# Patient Record
Sex: Female | Born: 1935 | Race: Black or African American | Hispanic: No | Marital: Married | Smoking: Former smoker
Health system: Southern US, Community
[De-identification: ages and names within clinical notes are randomized; demographics above are authoritative.]

## PROBLEM LIST (undated history)

## (undated) DIAGNOSIS — N390 Urinary tract infection, site not specified: Secondary | ICD-10-CM

## (undated) DIAGNOSIS — F0390 Unspecified dementia without behavioral disturbance: Secondary | ICD-10-CM

## (undated) DIAGNOSIS — R4189 Other symptoms and signs involving cognitive functions and awareness: Secondary | ICD-10-CM

## (undated) DIAGNOSIS — E079 Disorder of thyroid, unspecified: Secondary | ICD-10-CM

## (undated) DIAGNOSIS — M1990 Unspecified osteoarthritis, unspecified site: Secondary | ICD-10-CM

## (undated) DIAGNOSIS — E7800 Pure hypercholesterolemia, unspecified: Secondary | ICD-10-CM

## (undated) DIAGNOSIS — I10 Essential (primary) hypertension: Secondary | ICD-10-CM

## (undated) HISTORY — DX: Pure hypercholesterolemia, unspecified: E78.00

## (undated) HISTORY — DX: Unspecified dementia, unspecified severity, without behavioral disturbance, psychotic disturbance, mood disturbance, and anxiety: F03.90

## (undated) HISTORY — PX: COLON SURGERY: SHX602

## (undated) HISTORY — DX: Other symptoms and signs involving cognitive functions and awareness: R41.89

## (undated) HISTORY — PX: GASTROSTOMY W/ FEEDING TUBE: SUR642

---

## 2000-03-28 ENCOUNTER — Ambulatory Visit (HOSPITAL_COMMUNITY): Admission: RE | Admit: 2000-03-28 | Discharge: 2000-03-28 | Payer: Self-pay | Admitting: Family Medicine

## 2000-05-09 ENCOUNTER — Ambulatory Visit (HOSPITAL_COMMUNITY): Admission: RE | Admit: 2000-05-09 | Discharge: 2000-05-09 | Payer: Self-pay

## 2001-05-22 ENCOUNTER — Ambulatory Visit (HOSPITAL_COMMUNITY): Admission: RE | Admit: 2001-05-22 | Discharge: 2001-05-22 | Payer: Self-pay | Admitting: Gastroenterology

## 2001-06-09 ENCOUNTER — Inpatient Hospital Stay (HOSPITAL_COMMUNITY): Admission: RE | Admit: 2001-06-09 | Discharge: 2001-06-15 | Payer: Self-pay | Admitting: Surgery

## 2001-06-09 ENCOUNTER — Encounter: Payer: Self-pay | Admitting: Surgery

## 2001-12-30 ENCOUNTER — Other Ambulatory Visit: Admission: RE | Admit: 2001-12-30 | Discharge: 2001-12-30 | Payer: Self-pay | Admitting: *Deleted

## 2002-01-06 ENCOUNTER — Other Ambulatory Visit: Admission: RE | Admit: 2002-01-06 | Discharge: 2002-01-06 | Payer: Self-pay | Admitting: Radiology

## 2002-03-04 ENCOUNTER — Ambulatory Visit (HOSPITAL_COMMUNITY): Admission: RE | Admit: 2002-03-04 | Discharge: 2002-03-04 | Payer: Self-pay | Admitting: Gastroenterology

## 2003-01-13 ENCOUNTER — Other Ambulatory Visit: Admission: RE | Admit: 2003-01-13 | Discharge: 2003-01-13 | Payer: Self-pay | Admitting: *Deleted

## 2005-08-27 ENCOUNTER — Emergency Department (HOSPITAL_COMMUNITY): Admission: EM | Admit: 2005-08-27 | Discharge: 2005-08-27 | Payer: Self-pay | Admitting: Emergency Medicine

## 2013-10-23 ENCOUNTER — Encounter: Payer: Self-pay | Admitting: Neurology

## 2013-10-26 ENCOUNTER — Ambulatory Visit: Payer: Medicare Other | Admitting: Neurology

## 2013-11-02 ENCOUNTER — Ambulatory Visit (INDEPENDENT_AMBULATORY_CARE_PROVIDER_SITE_OTHER): Payer: Medicare Other | Admitting: Neurology

## 2013-11-02 ENCOUNTER — Encounter: Payer: Self-pay | Admitting: Neurology

## 2013-11-02 VITALS — BP 158/90 | HR 66 | Ht 67.0 in | Wt 123.0 lb

## 2013-11-02 DIAGNOSIS — R4189 Other symptoms and signs involving cognitive functions and awareness: Secondary | ICD-10-CM

## 2013-11-02 DIAGNOSIS — F09 Unspecified mental disorder due to known physiological condition: Secondary | ICD-10-CM

## 2013-11-02 NOTE — Patient Instructions (Signed)
Overall you are doing fairly well but I do want to suggest a few things today:   Remember to drink plenty of fluid, eat healthy meals and do not skip any meals. Try to eat protein with a every meal and eat a healthy snack such as fruit or nuts in between meals. Try to keep a regular sleep-wake schedule and try to exercise daily, particularly in the form of walking, 20-30 minutes a day, if you can.   As far as your medications are concerned, I would like to suggest you start taking Aricept 5mg  nightly. This prescription was sent to your pharmacy.   Please have some blood work drawn today.   I would like to see you back in 4 months, sooner if we need to. Please call us with any interim questions, concerns, problems, updates or refill requests.   My clinical assistant and will answer any of your questions and relay your messages to me and also relay most of my messages to you.   Our phone number is [phone]. We also have an after hours call service for urgent matters and there is a physician on-call for urgent questions. For any emergencies you know to call 911 or go to the nearest emergency room

## 2013-11-02 NOTE — Progress Notes (Signed)
[facility]    Provider:  Dr Janann Colonel Referring Provider: No ref. provider found Primary Care Physician:  No primary provider on file.  CC:  Cognitive decline  HPI:  Brooke Watson is a 80 y.o. female here as a referral for cognitive decline and paranoia. She presents alone with no family. She notes she is doing fine overall. She denies any memory troubles. Notes she has some trouble recalling what she is supposed to do on a day to day basis. She denies any hallucinations. She notes that her family thinks she has some trouble with her memory. She is unable to further describe what the family concerns are.   Per PCP notes, patient has a history of dementia of unclear etiology. Lately has been having increased paranoia, accusing family of stealing her positions after she misplaces them.   Of note she is taking ropinirole, she is unclear why she is taking this or how long she has been on it.   Review of Systems: Out of a complete 14 system review, the patient complains of only the following symptoms, and all other reviewed systems are negative. + memory loss, insomnia  History   Social History  . Marital Status: Married    Spouse Name: Alveta Heimlich    Number of Children: N/A  . Years of Education: 12   Occupational History  .  [employer]  . Retired     Social History Main Topics  . Smoking status: Former Research scientist (life sciences)  . Smokeless tobacco: Never Used  . Alcohol Use: No  . Drug Use: No  . Sexual Activity: Not on file   Other Topics Concern  . Not on file   Social History Narrative   Patient is married. Alveta Heimlich   Patient has a daughter.   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     History reviewed. No pertinent family history.  Past Medical History  Diagnosis Date  . High cholesterol     Past Surgical History  Procedure Laterality Date  . Colon surgery      Current Outpatient Prescriptions  Medication Sig  Dispense Refill  . amLODipine (NORVASC) 5 MG tablet Take 5 mg by mouth daily.      Marland Kitchen atorvastatin (LIPITOR) 10 MG tablet       . KLOR-CON M20 20 MEQ tablet       . levothyroxine (SYNTHROID, LEVOTHROID) 50 MCG tablet Take 50 mcg by mouth daily before breakfast.      . rOPINIRole (REQUIP) 0.5 MG tablet        No current facility-administered medications for this visit.    Allergies as of 11/02/2013 - Review Complete 11/02/2013  Allergen Reaction Noted  . Amoxicillin  10/23/2013  . Zolpidem tartrate  10/23/2013    Vitals: BP 158/90  Pulse 66  Ht 5\' 7"  (1.702 m)  Wt 123 lb (55.792 kg)  BMI 19.26 kg/m2 Last Weight:  Wt Readings from Last 1 Encounters:  11/02/13 123 lb (55.792 kg)   Last Height:   Ht Readings from Last 1 Encounters:  11/02/13 5\' 7"  (1.702 m)     Physical exam: Exam: Gen: NAD, conversant Eyes: anicteric sclerae, moist conjunctivae HENT: Atraumatic, oropharynx clear Neck: Trachea midline; supple,  Lungs: CTA, no wheezing, rales, rhonic                          CV: RRR, no MRG Abdomen: Soft,  non-tender;  Extremities: No peripheral edema  Skin: Normal temperature, no rash,  Psych: Appropriate affect, pleasant  Neuro: MS:  MOCA 16/30  CN: PERRL, EOMI no nystagmus, no ptosis, sensation intact to LT V1-V3 bilat, face symmetric, no weakness, hearing grossly intact, palate elevates symmetrically, shoulder shrug 5/5 bilat,  tongue protrudes midline, no fasiculations noted.  Motor: normal bulk and tone Strength: 5/5  In all extremities  Coord: no rest tremor noted, minimal postural tremor L>RUE. Mild bradykinesia with finger taps bilat  Reflexes: symmetrical, bilat downgoing toes  Sens: LT intact in all extremities  Gait: posture, stance, stride unremarkable, mildly decreased arm swing on the left. Able to walk on heels and toes. Romberg absent.   Assessment:  After physical and neurologic examination, review of laboratory studies, imaging,  neurophysiology testing and pre-existing records, assessment will be reviewed on the problem list.  Plan:  Treatment plan and additional workup will be reviewed under Problem List.  1)Cogntiive decline  79y/o woman presenting for initial evaluation of cognitive decline and increasing paranoia. She presents alone and therefore history is unfortunately limited. Exam pertinent for a MOCA of 16/30. Unclear etiology of her cognitive decline. Will check B12, TSH, MMA. Can consider MRI brain in the future. Will start Aricept 5mg  nightly. Of note, patient is currently taking Requip 0.5mg  TID for unclear reasons, this medication can cause hallucinations which can be contributing to her paranoia. She has very minimal parkinsonian features on exam, unclear if they are being masked/treated by the Requip. If paranoia worsens would consider tapering down. Counseled patient to have her family come with her to the next visit so we can get a more accurate history. Counseled patient to limit her driving. Follow up in 4 months.   Jim Like, DO  [facility] [address] [place], [place] [address]  Phone [phone] Fax [phone]

## 2013-11-04 LAB — METHYLMALONIC ACID, SERUM: Methylmalonic Acid: 80 nmol/L (ref 0–378)

## 2013-11-04 LAB — VITAMIN B12: Vitamin B-12: 821 pg/mL (ref 211–946)

## 2013-11-04 LAB — TSH: TSH: 0.619 u[IU]/mL (ref 0.450–4.500)

## 2013-11-26 ENCOUNTER — Encounter: Payer: Self-pay | Admitting: Neurology

## 2014-02-16 ENCOUNTER — Ambulatory Visit (INDEPENDENT_AMBULATORY_CARE_PROVIDER_SITE_OTHER): Payer: Medicare Other | Admitting: Neurology

## 2014-02-16 ENCOUNTER — Encounter: Payer: Self-pay | Admitting: Neurology

## 2014-02-16 VITALS — BP 145/80 | HR 72 | Temp 97.4°F | Ht 68.0 in | Wt 125.0 lb

## 2014-02-16 DIAGNOSIS — G2581 Restless legs syndrome: Secondary | ICD-10-CM | POA: Insufficient documentation

## 2014-02-16 DIAGNOSIS — F070 Personality change due to known physiological condition: Secondary | ICD-10-CM

## 2014-02-16 DIAGNOSIS — F0390 Unspecified dementia without behavioral disturbance: Secondary | ICD-10-CM

## 2014-02-16 MED ORDER — DONEPEZIL HCL 5 MG PO TABS: 5.0000 mg | ORAL_TABLET | Freq: Every day | ORAL | Status: DC

## 2014-02-16 NOTE — Patient Instructions (Signed)
Overall you are doing fairly well but I do want to suggest a few things today:   Remember to drink plenty of fluid, eat healthy meals and do not skip any meals. Try to eat protein with a every meal and eat a healthy snack such as fruit or nuts in between meals. Try to keep a regular sleep-wake schedule and try to exercise daily, particularly in the form of walking, 20-30 minutes a day, if you can.   As far as your medications are concerned, I would like to suggest: Aricept 5mg  daily. If you tolerate it well, call in one month and we can increase to 10mg  daily  As far as diagnostic testing: MRI of the brain  I would like to see you back in 3-6 months, sooner if we need to. Please call us with any interim questions, concerns, problems, updates or refill requests.   Please also call us for any test results so we can go over those with you on the phone.  My clinical assistant and will answer any of your questions and relay your messages to me and also relay most of my messages to you.   Our phone number is [phone]. We also have an after hours call service for urgent matters and there is a physician on-call for urgent questions. For any emergencies you know to call 911 or go to the nearest emergency room

## 2014-02-16 NOTE — Progress Notes (Addendum)
[facility]    Provider:  Dr Jaynee Eagles Referring Provider: No ref. provider found Primary Care Physician:  No primary care provider on file.  CC:  Memory loss  HPI:  Brooke Watson is a 80 y.o. female here as a follow up for cognitive changes.   Memory problems happening for a year. She is accompanied by her husband of 68 years. And he provides much of the information and background. Started with misplacing things like combs or toothbrushes. Currently forgets some appointments, has to write things down which works. She pays the bills, not forgetting to pay bills or double paying. She puts her bills in a location and pays them as they come in. Is very organized. No hallucinations or delusions. Is here with her husband and have been married 37 years. Daughter is a Designer, jewellery. She got lost in [place] a month ago but doesn't live in [place] and doesn't get lost going to places she normally goes. She forgets days of the week sometimes. Patient feels this is age related. She forgets sometimes. No difficulty walking, no trmors, no stiffness.     Reviewed notes, labs and imaging from outside physicians, which showed: Nml b12 and tsh. Last saw Dr. Janann Colonel in July who thought her tid ropinerole use was causing some paranoia and told her to take at bedtime as needed and not tid. He also questioned some parkinsonism. At that time she was alone and did not have family member to give history. He thought possibly the ropinerole was contributing to paranoia.  Review of Systems: Patient complains of symptoms per HPI as well as the following symptoms: denies CP, SOB. Pertinent negatives per HPI. All others negative.   History   Social History  . Marital Status: Married    Spouse Name: Alveta Heimlich    Number of Children: N/A  . Years of Education: 12   Occupational History  .  [employer]  . Retired     Social History Main Topics  . Smoking status: Former Research scientist (life sciences)    . Smokeless tobacco: Never Used  . Alcohol Use: No  . Drug Use: No  . Sexual Activity: Not on file   Other Topics Concern  . Not on file   Social History Narrative   Patient is married. Alveta Heimlich   Patient has a daughter.   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     History reviewed. No pertinent family history.  Past Medical History  Diagnosis Date  . High cholesterol   . Cognitive decline     Past Surgical History  Procedure Laterality Date  . Colon surgery      Current Outpatient Prescriptions  Medication Sig Dispense Refill  . amLODipine (NORVASC) 5 MG tablet Take 5 mg by mouth daily.    Marland Kitchen atorvastatin (LIPITOR) 10 MG tablet     . KLOR-CON M20 20 MEQ tablet     . levothyroxine (SYNTHROID, LEVOTHROID) 50 MCG tablet Take 50 mcg by mouth daily before breakfast.    . rOPINIRole (REQUIP) 0.5 MG tablet     . donepezil (ARICEPT) 5 MG tablet Take 1 tablet (5 mg total) by mouth at bedtime. 30 tablet 3   No current facility-administered medications for this visit.    Allergies as of 02/16/2014 - Review Complete 02/16/2014  Allergen Reaction Noted  . Amoxicillin  10/23/2013  . Zolpidem tartrate  10/23/2013    Vitals: BP 145/80 mmHg  Pulse 72  Temp(Src) 97.4 F (36.3 C) (Oral)  Ht 5\' 8"  (1.727 m)  Wt 125 lb (56.7 kg)  BMI 19.01 kg/m2 Last Weight:  Wt Readings from Last 1 Encounters:  02/16/14 125 lb (56.7 kg)   Last Height:   Ht Readings from Last 1 Encounters:  02/16/14 5\' 8"  (1.727 m)    Physical exam: Exam: Gen: NAD, conversant, well nourised, well groomed, perfectly dressed                     CV: RRR, no MRG. No Carotid Bruits. No peripheral edema, warm, nontender Eyes: Conjunctivae clear without exudates or hemorrhage  Neuro: Detailed Neurologic Exam  Speech:    Speech is normal; fluent and spontaneous with normal comprehension.  Cognition: MoCA 16/30 with loss of points for visuospatial execution, clock  drawing, serial 7, delayed recall and orientation.     The patient is oriented to person, place, and time;     recent memory impaired and remote memory intact;     language fluent;     normal attention, concentration,  fund of knowledge  Cranial Nerves:    The pupils are equal, round, and reactive to light. The fundi are normal and spontaneous venous pulsations are present. Visual fields are full to finger confrontation. Extraocular movements are intact. Trigeminal sensation is intact and the muscles of mastication are normal. The face is symmetric. The palate elevates in the midline. Voice is normal. Shoulder shrug is normal. The tongue has normal motion without fasciculations.   Coordination:    Normal finger to nose and heel to shin.    Gait:    Heel-toe and tandem gait are normal.   Motor Observation:    Mild postural tremor Tone:    Normal muscle tone.    Posture:    Posture is normal.     Strength:    Strength is V/V in the upper and lower limbs.      Sensation: intact to LT     Reflex Exam:  DTR's:    Absent achilles otherwise deep tendon reflexes in the upper and lower extremities are brisk bilaterally.   Toes:    The toes are downgoing bilaterally.   Clonus:    Clonus is absent.  Assessment/Plan:  80 year old female who is here for follow up of cognitive changes and RLS . She seems to be very organized which helps her remember to pay bills and go to appointments. She lives with her husband of 7 years and they live independently. Her MoCA is stable. They seem to be performingg all ADLs and IAdLs independently without issues. Would characterize as moderately-severe cognitive changes. MoCA 16/30, considered dementia at closer to 11/30 or less. Never had an MRI of the brain, will order to ensure there is no other etiology for her symptoms. Will also start Aricept 5mg  and they will call in one month to increase if tolerates. Needs to follow with PCP for her thyroid. She  stopped taking her thyroid medication. Takes requip for RLS prn before bed which is fine. Denies paranoia as was documented previously by pcp, possibly just improved since stopping Ropinerole tid. Will follow.   Sarina Ill, MD  [facility] [address] [place], [place] [address]  Phone [phone] Fax [phone]   Lenor Coffin

## 2014-03-01 ENCOUNTER — Ambulatory Visit
Admission: RE | Admit: 2014-03-01 | Discharge: 2014-03-01 | Disposition: A | Discharge status: Home / Self Care | Payer: Medicare Other | Source: Ambulatory Visit | Attending: Neurology | Admitting: Neurology

## 2014-03-01 DIAGNOSIS — F070 Personality change due to known physiological condition: Secondary | ICD-10-CM

## 2014-03-01 DIAGNOSIS — F0390 Unspecified dementia without behavioral disturbance: Secondary | ICD-10-CM

## 2014-03-10 ENCOUNTER — Telehealth: Payer: Self-pay | Admitting: Neurology

## 2014-03-10 NOTE — Telephone Encounter (Signed)
Spoke with patient and her husband. Relayed MRi of the brain results that showed moderate to severe atrophy and a remote infarct. Tried calling daughter at [phone] and received busy signal twice. ASA daily for stroke prevention and follow with pcp to manage vascular risk factors.

## 2014-05-25 ENCOUNTER — Ambulatory Visit: Payer: Medicare Other | Admitting: Neurology

## 2014-05-26 ENCOUNTER — Encounter: Payer: Self-pay | Admitting: Neurology

## 2014-06-30 ENCOUNTER — Encounter: Payer: Self-pay | Admitting: Neurology

## 2014-06-30 ENCOUNTER — Ambulatory Visit (INDEPENDENT_AMBULATORY_CARE_PROVIDER_SITE_OTHER): Payer: Medicare Other | Admitting: Neurology

## 2014-06-30 VITALS — BP 157/90 | HR 60 | Ht 68.0 in | Wt 126.0 lb

## 2014-06-30 DIAGNOSIS — F0391 Unspecified dementia with behavioral disturbance: Secondary | ICD-10-CM

## 2014-06-30 DIAGNOSIS — F03918 Unspecified dementia, unspecified severity, with other behavioral disturbance: Secondary | ICD-10-CM

## 2014-06-30 DIAGNOSIS — E038 Other specified hypothyroidism: Secondary | ICD-10-CM

## 2014-06-30 DIAGNOSIS — F22 Delusional disorders: Secondary | ICD-10-CM

## 2014-06-30 MED ORDER — ATORVASTATIN CALCIUM 10 MG PO TABS: 10.0000 mg | ORAL_TABLET | Freq: Every day | ORAL | Status: DC

## 2014-06-30 MED ORDER — DONEPEZIL HCL 10 MG PO TABS: 10.0000 mg | ORAL_TABLET | Freq: Every day | ORAL | Status: DC

## 2014-06-30 MED ORDER — ASPIRIN EC 81 MG PO TBEC: 81.0000 mg | DELAYED_RELEASE_TABLET | Freq: Every day | ORAL | Status: DC

## 2014-06-30 NOTE — Progress Notes (Signed)
[facility]    Provider:  Dr Jaynee Eagles Referring Provider: Raina Mina., MD Primary Care Physician:  Gilford Rile, MD  CC:  Dementia  06/30/2014:  Brooke Watson is a 81 y.o. female here as a follow up.  She is not taking any medication, stopped all meds. Stopped her HTN medications, stopped her synthroid, she stopped her aspirin, lipitor. Her memory is getting bad. She didn't know who her husband was yesterday. Her sister came from [place] and told calmed patient down. She wants to wander at night. Husband is very confused on medications. She is having severe behavioral episodes, losing money, yelling at husband, thinks people are stealing from her.  02/17/2015: Brooke Watson is a 81 y.o. female here as a follow up for cognitive changes.   Memory problems happening for a year. She is accompanied by her husband of 75 years. And he provides much of the information and background. Started with misplacing things like combs or toothbrushes. Currently forgets some appointments, has to write things down which works. She pays the bills, not forgetting to pay bills or double paying. She puts her bills in a location and pays them as they come in. Is very organized. No hallucinations or delusions. Is here with her husband and have been married 86 years. Daughter is a Designer, jewellery. She got lost in [place] a month ago but doesn't live in [place] and doesn't get lost going to places she normally goes. She forgets days of the week sometimes. Patient feels this is age related. She forgets sometimes. No difficulty walking, no trmors, no stiffness.    Reviewed notes, labs and imaging from outside physicians, which showed: Nml b12 and tsh. Last saw Dr. Janann Colonel in July who thought her tid ropinerole use was causing some paranoia and told her to take at bedtime as needed and not tid. He also questioned some parkinsonism. At that time she was alone and did not have family  member to give history. He thought possibly the ropinerole was contributing to paranoia.  Review of Systems: Patient complains of symptoms per HPI as well as the following symptoms: No CP, No SOB. Pertinent negatives per HPI. All others negative.   History   Social History  . Marital Status: Married    Spouse Name: Alveta Heimlich  . Number of Children: 1  . Years of Education: 12   Occupational History  . Retired [employer]   Social History Main Topics  . Smoking status: Former Research scientist (life sciences)  . Smokeless tobacco: Never Used  . Alcohol Use: No  . Drug Use: No  . Sexual Activity: Not on file   Other Topics Concern  . Not on file   Social History Narrative   Patient is married. Alveta Heimlich   Patient has a daughter.   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    Caffeine use: none     Family History  Problem Relation Age of Onset  . Dementia Neg Hx     Past Medical History  Diagnosis Date  . High cholesterol   . Cognitive decline   . Dementia     Past Surgical History  Procedure Laterality Date  . Colon surgery      Current Outpatient Prescriptions  Medication Sig Dispense Refill  . levothyroxine (SYNTHROID, LEVOTHROID) 50 MCG tablet Take 50 mcg by mouth daily before breakfast.    . amLODipine (NORVASC) 5 MG tablet Take 5 mg by mouth daily.    Marland Kitchen  atorvastatin (LIPITOR) 10 MG tablet     . donepezil (ARICEPT) 5 MG tablet Take 1 tablet (5 mg total) by mouth at bedtime. (Patient not taking: Reported on 06/30/2014) 30 tablet 3  . KLOR-CON M20 20 MEQ tablet     . rOPINIRole (REQUIP) 0.5 MG tablet      No current facility-administered medications for this visit.    Allergies as of 06/30/2014 - Review Complete 06/30/2014  Allergen Reaction Noted  . Amoxicillin  10/23/2013  . Zolpidem tartrate  10/23/2013    Vitals: BP 157/90 mmHg  Pulse 60  Ht 5\' 8"  (1.727 m)  Wt 126 lb (57.153 kg)  BMI 19.16 kg/m2 Last Weight:  Wt Readings from  Last 1 Encounters:  06/30/14 126 lb (57.153 kg)   Last Height:   Ht Readings from Last 1 Encounters:  06/30/14 5\' 8"  (1.727 m)   Speech:  Speech is normal; fluent and spontaneous with normal comprehension.  Cognition: Today MoCA 12/30, last MoCA 16/30   The patient is oriented to person, place, and time;   recent memory impaired and remote memory impaired;   language fluent;   impaired attention, concentration,impaired  fund of knowledge  Cranial Nerves:  The pupils are equal, round, and reactive to light. Visual fields are full to finger confrontation. Extraocular movements are intact. Trigeminal sensation is intact and the muscles of mastication are normal. The face is symmetric. The palate elevates in the midline. Voice is normal. Shoulder shrug is normal. The tongue has normal motion without fasciculations.    Motor Observation:  Mild postural tremor Tone:  Normal muscle tone.   Posture:  Posture is normal.    Strength:  Strength is V/V in the upper and lower limbs.       Assessment/Plan:  81 year old female who is here for follow up of Dementia with behavioral disturbances and delusions. Significant decline in the last several months. Stopped taking all her medications. MoCA 12/30. Husband very confused as well.  She lives with her husband of 30 years and they live independently.     Discontinue Requip and all unnecessary medications to try and help family. Husband is very confused as well. Unclear on medications and what they are used for. Reviewed in detail. Will increase Aricept to 10mg  daily. Continue ASA 81mg . Needs to follow with pcp for blood pressure, HLD, and hypothyroidism to determine which medications are absolutely necessary. Do not want to start Namenda at this time as feel it will be difficult for patient or husband to keep track of titration. Can try Risperidol for delusions.   Patient needs to stop driving. Discussed with  daughter and husband and patient. Husband understands. Daughter says she will take away her keys and license.   07/01/2014: Spoke to daughter at length on the phone. Mother is having severe behavioural distrubances. She agrees with father and would like something in the evenings to help patient with delusions due to dementia. Daughter is willing to help her father dose the Namenda. Father and daughter very much would like the Namenda added to the Aricept. I'm concerned that patient's husband is also very confused, that they should try to minimize the medications to the ones she completely needs which will involve help from primary care as well. I am unsure husband can manage this, daughter says she will help. I advised looking into assisted living or aids to help them.  Spoke with pharmacist. Patient lost Aricept, doesn't even remember coming to get it. Husband came  back with patient and had all patient's old pill bottles, asking for refills, asking what each medication is for. Very confused. Let daughter know and again expressed concern to daughter and patient's husband.   Sarina Ill, MD  [facility] [address] [place], [place] [address]  Phone [phone] Fax [phone]  A total of 30 minutes was spent face-to-face with this patient. Over half this time was spent on counseling patient on the Dementia diagnosis and different diagnostic and therapeutic options available.

## 2014-06-30 NOTE — Patient Instructions (Addendum)
As far as your medications are concerned, I would like to suggest: see list of medications As far as diagnostic : thyroid lab  I would like to see you back in 3 months, sooner if we need to. Please call us with any interim questions, concerns, problems, updates or refill requests.   Our phone number is [phone]. We also have an after hours call service for urgent matters and there is a physician on-call for urgent questions. For any emergencies you know to call 911 or go to the nearest emergency room

## 2014-07-01 ENCOUNTER — Telehealth: Payer: Self-pay | Admitting: *Deleted

## 2014-07-01 DIAGNOSIS — F0391 Unspecified dementia with behavioral disturbance: Secondary | ICD-10-CM | POA: Insufficient documentation

## 2014-07-01 DIAGNOSIS — F22 Delusional disorders: Secondary | ICD-10-CM | POA: Insufficient documentation

## 2014-07-01 DIAGNOSIS — F03918 Unspecified dementia, unspecified severity, with other behavioral disturbance: Secondary | ICD-10-CM | POA: Insufficient documentation

## 2014-07-01 LAB — THYROID PANEL WITH TSH
Free Thyroxine Index: 2.8 (ref 1.2–4.9)
T3 Uptake Ratio: 25 % (ref 24–39)
T4, Total: 11.1 ug/dL (ref 4.5–12.0)
TSH: 2.49 u[IU]/mL (ref 0.450–4.500)

## 2014-07-01 LAB — COMPREHENSIVE METABOLIC PANEL
ALT: 12 IU/L (ref 0–32)
AST: 25 IU/L (ref 0–40)
Albumin/Globulin Ratio: 1.9 (ref 1.1–2.5)
Albumin: 4.4 g/dL (ref 3.5–4.8)
Alkaline Phosphatase: 87 IU/L (ref 39–117)
BUN/Creatinine Ratio: 14 (ref 11–26)
BUN: 16 mg/dL (ref 8–27)
Bilirubin Total: 1.2 mg/dL (ref 0.0–1.2)
CHLORIDE: 100 mmol/L (ref 97–108)
CO2: 25 mmol/L (ref 18–29)
Calcium: 9.6 mg/dL (ref 8.7–10.3)
Creatinine, Ser: 1.14 mg/dL — ABNORMAL HIGH (ref 0.57–1.00)
GFR calc Af Amer: 53 mL/min/{1.73_m2} — ABNORMAL LOW (ref >59)
GFR calc non Af Amer: 46 mL/min/{1.73_m2} — ABNORMAL LOW (ref >59)
GLUCOSE: 92 mg/dL (ref 65–99)
Globulin, Total: 2.3 g/dL (ref 1.5–4.5)
Potassium: 3.7 mmol/L (ref 3.5–5.2)
Sodium: 141 mmol/L (ref 134–144)
TOTAL PROTEIN: 6.7 g/dL (ref 6.0–8.5)

## 2014-07-01 MED ORDER — RISPERIDONE 0.25 MG PO TABS: 0.2500 mg | ORAL_TABLET | Freq: Every evening | ORAL | Status: DC

## 2014-07-01 MED ORDER — MEMANTINE HCL ER 7 & 14 & 21 &28 MG PO CP24: 28.0000 mg | ORAL_CAPSULE | Freq: Every day | ORAL | Status: DC

## 2014-07-01 NOTE — Telephone Encounter (Signed)
I spoke to patient's daughter at length. Daughter is going to call back her parents and explain. Patient lost the prescription of Aricept (and Lipitor) and so when she went to have it refilled, insurance would not pay for the replacement. That is why it cost so much. No need to call them back. Thank you.

## 2014-07-01 NOTE — Telephone Encounter (Signed)
Brooke Watson is calling stating that the patient meds are way to expensive. Please call and advise.

## 2014-07-08 ENCOUNTER — Telehealth: Payer: Self-pay | Admitting: Neurology

## 2014-07-08 NOTE — Telephone Encounter (Signed)
Pt's daughter is calling stating she would like a phone call from Dr. Jaynee Eagles regarding pt's medication.  She wants to know if she can increase risperiDONE (RISPERDAL) 0.25 MG tablet at night or see if you have any other suggestions.  Please call and advise.

## 2014-07-09 ENCOUNTER — Telehealth: Payer: Self-pay | Admitting: *Deleted

## 2014-07-09 ENCOUNTER — Other Ambulatory Visit: Payer: Self-pay | Admitting: Neurology

## 2014-07-09 ENCOUNTER — Emergency Department (HOSPITAL_COMMUNITY)
Admission: EM | Admit: 2014-07-09 | Discharge: 2014-07-10 | Disposition: A | Discharge status: Home / Self Care | Payer: Medicare Other | Attending: Emergency Medicine | Admitting: Emergency Medicine

## 2014-07-09 ENCOUNTER — Encounter (HOSPITAL_COMMUNITY): Payer: Self-pay | Admitting: Emergency Medicine

## 2014-07-09 DIAGNOSIS — F03918 Unspecified dementia, unspecified severity, with other behavioral disturbance: Secondary | ICD-10-CM

## 2014-07-09 DIAGNOSIS — Z79899 Other long term (current) drug therapy: Secondary | ICD-10-CM | POA: Diagnosis not present

## 2014-07-09 DIAGNOSIS — Z7982 Long term (current) use of aspirin: Secondary | ICD-10-CM | POA: Insufficient documentation

## 2014-07-09 DIAGNOSIS — Z880 Allergy status to penicillin: Secondary | ICD-10-CM | POA: Diagnosis not present

## 2014-07-09 DIAGNOSIS — Z9183 Wandering in diseases classified elsewhere: Secondary | ICD-10-CM | POA: Diagnosis not present

## 2014-07-09 DIAGNOSIS — Z8639 Personal history of other endocrine, nutritional and metabolic disease: Secondary | ICD-10-CM | POA: Insufficient documentation

## 2014-07-09 DIAGNOSIS — F0391 Unspecified dementia with behavioral disturbance: Secondary | ICD-10-CM | POA: Diagnosis not present

## 2014-07-09 DIAGNOSIS — Z87891 Personal history of nicotine dependence: Secondary | ICD-10-CM | POA: Insufficient documentation

## 2014-07-09 DIAGNOSIS — F0390 Unspecified dementia without behavioral disturbance: Secondary | ICD-10-CM | POA: Diagnosis present

## 2014-07-09 LAB — COMPREHENSIVE METABOLIC PANEL
ALK PHOS: 69 U/L (ref 39–117)
ALT: 15 U/L (ref 0–35)
ANION GAP: 8 (ref 5–15)
AST: 27 U/L (ref 0–37)
Albumin: 4 g/dL (ref 3.5–5.2)
BUN: 23 mg/dL (ref 6–23)
CO2: 26 mmol/L (ref 19–32)
Calcium: 9.1 mg/dL (ref 8.4–10.5)
Chloride: 105 mmol/L (ref 96–112)
Creatinine, Ser: 1.15 mg/dL — ABNORMAL HIGH (ref 0.50–1.10)
GFR calc non Af Amer: 44 mL/min — ABNORMAL LOW (ref >90)
GFR, EST AFRICAN AMERICAN: 51 mL/min — AB (ref >90)
GLUCOSE: 102 mg/dL — AB (ref 70–99)
POTASSIUM: 3.2 mmol/L — AB (ref 3.5–5.1)
Sodium: 139 mmol/L (ref 135–145)
Total Bilirubin: 0.7 mg/dL (ref 0.3–1.2)
Total Protein: 6.6 g/dL (ref 6.0–8.3)

## 2014-07-09 LAB — URINALYSIS, ROUTINE W REFLEX MICROSCOPIC
Bilirubin Urine: NEGATIVE
Glucose, UA: NEGATIVE mg/dL
Ketones, ur: NEGATIVE mg/dL
NITRITE: NEGATIVE
Protein, ur: NEGATIVE mg/dL
SPECIFIC GRAVITY, URINE: 1.015 (ref 1.005–1.030)
UROBILINOGEN UA: 1 mg/dL (ref 0.0–1.0)
pH: 7 (ref 5.0–8.0)

## 2014-07-09 LAB — RAPID URINE DRUG SCREEN, HOSP PERFORMED
Amphetamines: NOT DETECTED
Barbiturates: NOT DETECTED
Benzodiazepines: NOT DETECTED
COCAINE: NOT DETECTED
Opiates: NOT DETECTED
TETRAHYDROCANNABINOL: NOT DETECTED

## 2014-07-09 LAB — CBC
HCT: 40.2 % (ref 36.0–46.0)
Hemoglobin: 13.5 g/dL (ref 12.0–15.0)
MCH: 30.1 pg (ref 26.0–34.0)
MCHC: 33.6 g/dL (ref 30.0–36.0)
MCV: 89.7 fL (ref 78.0–100.0)
PLATELETS: 180 10*3/uL (ref 150–400)
RBC: 4.48 MIL/uL (ref 3.87–5.11)
RDW: 13.9 % (ref 11.5–15.5)
WBC: 7.6 10*3/uL (ref 4.0–10.5)

## 2014-07-09 LAB — ACETAMINOPHEN LEVEL

## 2014-07-09 LAB — URINE MICROSCOPIC-ADD ON

## 2014-07-09 LAB — ETHANOL

## 2014-07-09 LAB — SALICYLATE LEVEL: Salicylate Lvl: <4 mg/dL (ref 2.8–20.0)

## 2014-07-09 MED ORDER — RISPERIDONE 0.25 MG PO TABS: 0.7500 mg | ORAL_TABLET | Freq: Every evening | ORAL | Status: DC

## 2014-07-09 NOTE — ED Provider Notes (Signed)
CSN: [id number]     Arrival date & time 07/09/14  2000 History   First MD Initiated Contact with Patient 07/09/14 2129     Chief Complaint  Patient presents with  . Dementia    HPI   81 year old female presents with worsening dementia. Most of the history was obtained from daughter and husband. Patient's daughter was contacted via phone and reports that over the last couple months she's had worsening dementia including forgetfulness and wondering. Daughter reports that over the last week since her last visit with neurologist (Dr. Jaynee Eagles) she is acutely worsened. She reports that she spoken with both her neurologist and her primary care provider (Dr. Stann Mainland)  today, her neurologist is out of town and recommended she be seen by her primary care provider. PCP instructed them to bring her to the emergency room for medical evaluation. Daughter's concerns today are that there may be another reason for the increased and dementia related symptoms, and also feels that her father can no longer keep up with with her and it's not safe for her to be at home. She reports that today she attempted to get into a vehicle with people she did not know. Both the daughter and the husband report a good bill of health with the exception of the rapidly progressing dementia. She denies any complaints at this time and understands that she cannot recall things. She denies headache, cough, shortness of breath, abdominal pain, changes in urinary habits frequencies or control. When asked recall the president year, date patient was not able to recall. She is able to recall distant memories with acute detail, but has difficulty with intermittent details of her family's life. Husband explains that she suffers both short and long-term memory disabilities. He reports that at night she is often awake wondering about the house, but reports she stays in the house he denies aggressive behavior at this time. ,.  Past Medical History  Diagnosis  Date  . High cholesterol   . Cognitive decline   . Dementia    Past Surgical History  Procedure Laterality Date  . Colon surgery     Family History  Problem Relation Age of Onset  . Dementia Neg Hx    History  Substance Use Topics  . Smoking status: Former Research scientist (life sciences)  . Smokeless tobacco: Never Used  . Alcohol Use: No   OB History    No data available     Review of Systems  All other systems reviewed and are negative.   Allergies  Amoxicillin and Zolpidem tartrate  Home Medications   Prior to Admission medications   Medication Sig Start Date End Date Taking? Authorizing Provider  amLODipine (NORVASC) 5 MG tablet Take 5 mg by mouth daily.    Historical Provider, MD  aspirin EC 81 MG tablet Take 1 tablet (81 mg total) by mouth daily. 06/30/14   Melvenia Beam, MD  donepezil (ARICEPT) 10 MG tablet Take 1 tablet (10 mg total) by mouth at bedtime. 06/30/14   Melvenia Beam, MD  levothyroxine (SYNTHROID, LEVOTHROID) 50 MCG tablet Take 50 mcg by mouth daily before breakfast.    Historical Provider, MD  Memantine HCl ER (NAMENDA XR TITRATION PACK) 7 & 14 & 21 &28 MG CP24 Take 28 mg by mouth daily. 07/01/14   Melvenia Beam, MD  risperiDONE (RISPERDAL) 0.25 MG tablet Take 3 tablets (0.75 mg total) by mouth every evening. 07/09/14   Melvenia Beam, MD   BP 140/94 mmHg  Pulse  88  Temp(Src) 98.2 F (36.8 C) (Oral)  Resp 20  SpO2 98% Physical Exam  Constitutional: She is oriented to person, place, and time. She appears well-developed and well-nourished.  HENT:  Head: Normocephalic and atraumatic.  Eyes: Pupils are equal, round, and reactive to light.  Neck: Normal range of motion. Neck supple. No JVD present. No tracheal deviation present. No thyromegaly present.  Cardiovascular: Normal rate, regular rhythm, normal heart sounds and intact distal pulses.  Exam reveals no gallop and no friction rub.   No murmur heard. Pulmonary/Chest: Effort normal and breath sounds normal. No  stridor. No respiratory distress. She has no wheezes. She has no rales. She exhibits no tenderness.  Abdominal: Soft. Bowel sounds are normal. She exhibits no distension and no mass. There is no tenderness. There is no rebound and no guarding.  Musculoskeletal: Normal range of motion.  Lymphadenopathy:    She has no cervical adenopathy.  Neurological: She is alert and oriented to person, place, and time. She has normal strength. No cranial nerve deficit or sensory deficit. She displays a negative Romberg sign. Coordination and gait normal. GCS eye subscore is 4. GCS verbal subscore is 5. GCS motor subscore is 6.  Reflex Scores:      Patellar reflexes are 2+ on the right side. Skin: Skin is warm and dry.  Psychiatric: She has a normal mood and affect. Her speech is normal and behavior is normal. Judgment and thought content normal.  Nursing note and vitals reviewed.   ED Course  Procedures (including critical care time) Labs Review Labs Reviewed  CBC  ACETAMINOPHEN LEVEL  COMPREHENSIVE METABOLIC PANEL  ETHANOL  SALICYLATE LEVEL  URINE RAPID DRUG SCREEN (HOSP PERFORMED)  URINALYSIS, ROUTINE W REFLEX MICROSCOPIC    Imaging Review No results found.   EKG Interpretation None     MDM   Final diagnoses:  Dementia, with behavioral disturbance   Labs: Urine drug screen, acetaminophen, CBC, ethanol, salicylate no significant findings  Urinalysis moderate leukocytes  Consults: Case Management, clinical social work  Therapeutics: Keflex  Assessment: Dementia  Plan: Patient presents with worsening dementia. At the time of evaluation patient was calm and collected and answering my questions appropriately. She difficulty with short-term and long-term recollection but at times is very sharp with current details. I personally spoke with her daughter twice throughout her stay via phone and explained the situation with her. Her daughter's main concern today was for an organic cause to  the worsening dementia, and she expressed her concerns for her father taking care of her alone at home. Patient's husband was bedside throughout the entire exam and hospital stay he did not feel that she needed to be here in the hospital and he can adequately maintain her health and safety at home. The patient's brother was also at the bedside expresses concerns for her management at home with only her husband there. Patient's exam and laboratory findings did not show any significant signs that would be causing worsening dementia. She did have moderate leukocytes on her urinalysis and was given a dose of Keflex here in the ED with outpatient prescription. Critical social worker and case management consult and clinical social work were consulted. After long detailed discussion is both mine, the patient, her husband, her brothers recommendation that she be managed outpatient. Her brother agreed that he would stay over at the house tonight to help make sure that she was okay. We talked in detail about having in-home evaluation and necessary precautions to take  to protect her. Everyone understood and agreed to the plan. It was stressed that car keys be taken away from her as she is no longer safe to drive on her own. There was given instructions to monitor for worsening signs or symptoms and return to the emergency room or call EMS immediately if any present. All parties present at time of evaluation and discussion agreed to the plan. Her daughter was again spoken to after discharge and informed of plan.      Okey Regal, PA-C 07/10/14 0221  Daleen Bo, MD 07/10/14 [phone]

## 2014-07-09 NOTE — Progress Notes (Signed)
CSW was notified by nurse that pt presents to [facility] due to dementia and that the family is concerned.  CSW met with pt at bedside. Husband was present. Patient informed CSW that she lives at home with her husband in [place], [place]. Patient states that she completes her ADL's independently. Patient states that she has not fallen within the past 6 months.   Patient states that she does have prescribed medications. Patient informed CSW that she currently does her own medication management. However, she states that she feels as though she may need assistance sometimes. Patient stated " I try to do the best I can, but I do make mistakes. I try to be real careful with my medicine."  Husband states that he is the pt's primary support. Husband informed CSW that he does believe the pt is getting worse. He states that the pt has gotten into cars with strangers and that the pt has done do x3 today.  Husband and pt state that they are not interested in a facility. He states that the pt currently does not have home health. Husband informed CSW that he wishes to take the pt home upon discharge.  CSW consulted with PA who states that he has spoken with the pt's daughter "Tina", who lives in [place]. PA informed CSW that pt's daughter lives in [place] but she is very much involved. He states that daughter is concerned about the pt living at home due to the pt's progressing dementia. Also, he informed CSW that the daughter has been reaching out to neurologist and her PCP.  PA states that the pt is appropriate for an ALF in a memory care unit. He informed CSW that daughter and brother do not feel as thought the pt will be safe at home. CSW will start FL2 for pt.   , LCSWA [phone] ED CSW 07/09/2014 11:18 PM      

## 2014-07-09 NOTE — Progress Notes (Signed)
CSW spoke with PA who states that home health care is more appropriate at this time.  PA states that brother agrees to stay in the house over the weekend until home health is set up. PA has ordered home health which will include a Education officer, museum, nurse, and OT.  CSW gave the pt's family resources to contact DSS to apply for medicaid and [employer].  Willette Brace [phone] ED CSW 07/10/2014 12:16 AM

## 2014-07-09 NOTE — Telephone Encounter (Signed)
Left message for patient's daughter. Then called patient's home to speak with patient's husband. Patient doesn't know where she is and husband is not home.  She asked me to call her husband at [phone]. Patient should not be driving, husband acknowledges he took away her keys and does not let her drive. He has increased the risperdal to .5mg , we can increase it to .75 mg qhs at this time. But he should probably see a geriatric psychiatrist, will discuss with daughter.

## 2014-07-09 NOTE — ED Provider Notes (Signed)
  Face-to-face evaluation   History: Patient is brought in by her husband, at the suggestion of her daughter, because of progression of her dementia. He is being actively evaluated and treated by her neurologist. The neurologist has begun to initiate a referral to a geriatric psychiatrist. That has not yet been arranged.   Physical exam: Patient is alert, calm, cooperative. She is confused. She repeatedly cause her husband was with her, several different people. She is eating, and appears nontoxic.  Medical screening examination/treatment/procedure(s) were conducted as a shared visit with non-physician practitioner(s) and myself.  I personally evaluated the patient during the encounter  Daleen Bo, MD 07/10/14 1147

## 2014-07-09 NOTE — Telephone Encounter (Signed)
Error

## 2014-07-09 NOTE — ED Notes (Signed)
Pt has hx of dementia this is getting progressively worse per family. Pt has began wandering and getting into cars with strangers. Family is concerned for pt.

## 2014-07-09 NOTE — Telephone Encounter (Signed)
Also spoke to daughter. Will increase Risperdal to .75 mg in the evenings and if needed to 1mg  qhs for patient's behavioral disturbances associated with dementia. Recommended Dr. Norma Fredrickson, geriatric psychiatrist. Will place a referral. Daughter agrees.   Markham Jordan - can you please put daughter's phone number on the referral to Dr. Casimiro Needle? They should contact daughter as primary. [phone]. Also should put this number as the primary contact on patient's EPIC record, patient's husband and daughter give permission.    Terrence Dupont - can you send my last office note to Dr. Roseanne Reno may be in [place] per the daughter [name]), her primary care? Fax it over Monday morning please, not sue he can get notes via EPIC. And can you please change the phone number to the above on her EPIC record.  Thank you.

## 2014-07-10 MED ORDER — CIPROFLOXACIN HCL 500 MG PO TABS: 500.0000 mg | ORAL_TABLET | Freq: Two times a day (BID) | ORAL | Status: DC

## 2014-07-10 MED ORDER — CEPHALEXIN 500 MG PO CAPS
500.0000 mg | ORAL_CAPSULE | Freq: Once | ORAL | Status: AC
  Administered 2014-07-10: 500 mg via ORAL
  Filled 2014-07-10: qty 1

## 2014-07-10 MED ORDER — CEPHALEXIN 500 MG PO CAPS: 500.0000 mg | ORAL_CAPSULE | Freq: Two times a day (BID) | ORAL | Status: DC

## 2014-07-10 NOTE — Progress Notes (Addendum)
EDCM received phone call from EDSW requesting assistance with home health services.  Patient with dementia living with her husband.  Patient's husband requesting ALF placement per EDSW.  Patient with [employer].  EDSW provided patient's husband with phone so that [facility] could speak to him.  Patient's husband reports he does not have a preference as to which home health agency to chose.  [facility] chosen for home health services.  Patient's husband informed if he wanted patient to be placed into ALF patient would need to enroll for [facility] insurance.  EDCM offered information about [facility] to assist with placement from home.  Patient's husband agreeable to this.  EDCM offered patient's husband private duty nursing services.  Patient's husband refused this and appeared to become agitated and handed phone back to EDSW.  EDCM asked EDSW to provide patient with phone number to [facility], and contact information to [facility] and DSS for [place].  Discussed patient with EDPA in regards to placing orders for home health RN, OT and social worker with face to face.  Patient's husband reports patient's pcp is Dr. Bea Graff.  Patient's husband reports no dme needs at this time. No further EDCM needs at this time.

## 2014-07-10 NOTE — Care Management (Signed)
Referral called into Kristian at [facility] to start care 07/11/14.

## 2014-07-10 NOTE — Discharge Instructions (Signed)
Please contact your neurologist and primary care provider tomorrow and inform them of your emergency room visit. All relevant information including labs with them. Home health consult has been ordered for your assistance. Please monitor for new or worsening signs or symptoms and return if they present. Please make sure that all keys to vehicles are not assessable by patient, please make sure she is not unattended at any time. If you have any concerns for her safety and the safety of anyone around her please contact her primary care provider will call EMS.

## 2014-07-10 NOTE — Progress Notes (Signed)
Please place home health orders for RN, OT, and social worker with face to face prior to discharge.

## 2014-07-12 ENCOUNTER — Other Ambulatory Visit: Payer: Self-pay | Admitting: Neurology

## 2014-07-12 MED ORDER — HALOPERIDOL 0.5 MG PO TABS: 0.5000 mg | ORAL_TABLET | Freq: Three times a day (TID) | ORAL | Status: DC | PRN

## 2014-07-12 NOTE — Telephone Encounter (Signed)
Daughter Brooke Watson @ [phone] stated patient was diagnosed with UTI was prescribed amphetamine-dextroamphetamine (ADDERALL) 30 MG tablet by ER.  Brooke Watson stated mother is still very confused and father is now chasing pt around neighborhood.  Please call and advise.

## 2014-07-12 NOTE — Telephone Encounter (Signed)
I faxed over recent office visit notes to Dr. Gilford Rile office and changed the primary phone number to daughter's : [phone] in EPIC.

## 2014-07-12 NOTE — Telephone Encounter (Signed)
I called patient's daughter. Left message. I will call back.

## 2014-07-12 NOTE — Telephone Encounter (Signed)
Patient is agitated and violent, she went to the police station today because she didn't recognize her husband. She is on antibiotic for a UTI. Discussed with daughter. We can slowly titrate Risperdal up to 1mg  bid. Can try Haldol 0.5mg  tid prn only as needed for severe agitation for now. Must be careful not to over sedate patient.

## 2014-07-16 ENCOUNTER — Other Ambulatory Visit: Payer: Self-pay | Admitting: Neurology

## 2014-07-16 DIAGNOSIS — F03918 Unspecified dementia, unspecified severity, with other behavioral disturbance: Secondary | ICD-10-CM

## 2014-07-16 DIAGNOSIS — F0391 Unspecified dementia with behavioral disturbance: Secondary | ICD-10-CM

## 2014-07-16 MED ORDER — RISPERIDONE 1 MG PO TABS: 1.0000 mg | ORAL_TABLET | Freq: Two times a day (BID) | ORAL | Status: DC

## 2014-07-16 NOTE — Telephone Encounter (Signed)
Patient's daughter, [name] @ [phone], requesting Rx risperiDONE (RISPERDAL) 0.25 MG tablet increased to 1 mg due to patient taking 4 tabs a day, and with increase she would consume 1 tablet daily.  She also stated haloperidol (HALDOL) 0.5 MG tablet has help tremendously.  Please call and advise.

## 2014-07-19 NOTE — Telephone Encounter (Signed)
Spoke to her over the weekend and took care of it thanks.

## 2014-07-26 ENCOUNTER — Other Ambulatory Visit: Payer: Self-pay | Admitting: Neurology

## 2014-07-26 ENCOUNTER — Telehealth: Payer: Self-pay | Admitting: Neurology

## 2014-07-26 MED ORDER — MEMANTINE HCL-DONEPEZIL HCL ER 28-10 MG PO CP24: 1.0000 | ORAL_CAPSULE | Freq: Every day | ORAL | Status: DC

## 2014-07-26 NOTE — Telephone Encounter (Signed)
I ordered th Namzaric, daughter will call back if it is too expensive thanks

## 2014-07-26 NOTE — Telephone Encounter (Signed)
Please call daughter back. Let her know that I called in the Namzaric, the once daily combination drug. Since patient is on both the Namenda and the Aricept, insurance should pay for the combination drug now. I called it in, have her call me back with any problems. Stop taking the namenda and the aricept, replace it with Namzeric once daily. Thanks!

## 2014-07-26 NOTE — Telephone Encounter (Signed)
Pt's daughter is calling stating pt is near the end of Memantine HCl ER (NAMENDA XR TITRATION PACK) 7 & 14 & 21 &28 MG CP24 and has a couple of days left, but she would like for you to call in a 28 day supply and then the next month you can start the combination drug.  Please call and advise.

## 2014-07-26 NOTE — Telephone Encounter (Signed)
Dr. Jaynee Eagles spoke with Daughter. Please see documentation from Dr. Jaynee Eagles.

## 2014-08-30 ENCOUNTER — Ambulatory Visit (INDEPENDENT_AMBULATORY_CARE_PROVIDER_SITE_OTHER): Payer: Medicare Other | Admitting: Neurology

## 2014-08-30 ENCOUNTER — Encounter: Payer: Self-pay | Admitting: Neurology

## 2014-08-30 ENCOUNTER — Telehealth: Payer: Self-pay | Admitting: *Deleted

## 2014-08-30 ENCOUNTER — Ambulatory Visit
Admission: RE | Admit: 2014-08-30 | Discharge: 2014-08-30 | Disposition: A | Discharge status: Home / Self Care | Payer: Medicare Other | Source: Ambulatory Visit | Attending: Neurology | Admitting: Neurology

## 2014-08-30 VITALS — BP 170/105 | HR 81 | Ht 68.0 in | Wt 126.0 lb

## 2014-08-30 DIAGNOSIS — S300XXA Contusion of lower back and pelvis, initial encounter: Secondary | ICD-10-CM

## 2014-08-30 DIAGNOSIS — F0391 Unspecified dementia with behavioral disturbance: Secondary | ICD-10-CM

## 2014-08-30 DIAGNOSIS — R4689 Other symptoms and signs involving appearance and behavior: Secondary | ICD-10-CM

## 2014-08-30 DIAGNOSIS — F03918 Unspecified dementia, unspecified severity, with other behavioral disturbance: Secondary | ICD-10-CM

## 2014-08-30 DIAGNOSIS — F6089 Other specific personality disorders: Secondary | ICD-10-CM

## 2014-08-30 MED ORDER — RISPERIDONE 1 MG PO TABS: 0.5000 mg | ORAL_TABLET | Freq: Two times a day (BID) | ORAL | Status: DC

## 2014-08-30 NOTE — Progress Notes (Signed)
[facility]    Provider:  Dr Jaynee Eagles Referring Provider: Raina Mina., MD Primary Care Physician:  Gilford Rile, MD  CC: Dementia with behavioral disturbance  Interval history: She is doing better, she is sleeping all night. Husband and daughter provide all information. She fell in the tub, husband found slick material outside the tub one day and he found the matt out of the tub. She is having pain in the buttocks area. She is awake during the day and sleeps well at night.  Daughter says that patient was much improved after treatment of her UTI and now feels that patient is over sedated. I agree, today patient does not seem herself, not as well maintained but she is alert. Per husband, patient goes to bed at 7pm and sleeps all night. But he thinks patient has some back pain. Patient denies it, but husband says she seems uncomfortable in bed at night when laying down. Patient isn't wandering anymore, not going to the police station or having severe agitation or delusions. They cut the Risperidone in half and are only giving 0.5mg  at night which is good, asked them to keep medication to the bare minimum. But unfortunately they are giving haldol 1/2mg  in the morning and night regardless of agitation state(which was not as prescribed - haldol was only to be given as needed for severe agitation, delusions that were not redirectable or when she was combative and there was risk for harm). Husband feels patient is at a point where he can manage her and she is not confused or sedated.   I discussed that Risperdal is a newer medication and has less side effects than the Haldol. Haldol was prescribed only as absolutely needed for delusions or severe agitation that she was experiencing. Husband has been giving patient haldol twice daily instead of Risperdal. I recommend trying the Risperdal to 0.5mg  twice daily and then further to 0.25mg  twice daily, and stopping it if possible. Haldol was prn  for severe episodes. We want to keep the medication at a bare minimum to avoid sedation and any other side effects. Also, there is a risk of death in the elderly taking Risperdal and haldol so want to use it as sparingly as possible.  06/30/2014: Brooke Watson is a 81 y.o. female here as a follow up.  She is not taking any medication, stopped all meds. Stopped her HTN medications, stopped her synthroid, she stopped her aspirin, lipitor. Her memory is getting bad. She didn't know who her husband was yesterday. Her sister came from [place] and told calmed patient down. She wants to wander at night. Husband is very confused on medications. She is having severe behavioral episodes, losing money, yelling at husband, thinks people are stealing from her.  02/17/2015: Brooke Watson is a 81 y.o. female here as a follow up for cognitive changes.   Memory problems happening for a year. She is accompanied by her husband of 46 years. And he provides much of the information and background. Started with misplacing things like combs or toothbrushes. Currently forgets some appointments, has to write things down which works. She pays the bills, not forgetting to pay bills or double paying. She puts her bills in a location and pays them as they come in. Is very organized. No hallucinations or delusions. Is here with her husband and have been married 41 years. Daughter is a Designer, jewellery. She got lost in [place] a month ago but doesn't live in [place] and doesn't get  lost going to places she normally goes. She forgets days of the week sometimes. Patient feels this is age related. She forgets sometimes. No difficulty walking, no tremors, no stiffness.    Reviewed notes, labs and imaging from outside physicians, which showed: Nml b12 and tsh. Last saw Dr. Janann Colonel in July who thought her tid ropinerole use was causing some paranoia and told her to take at bedtime as needed and not tid. He also  questioned some parkinsonism. At that time she was alone and did not have family member to give history. He thought possibly the ropinerole was contributing to paranoia.   Review of Systems: Patient complains of symptoms per HPI as well as the following symptoms:  No chest pain, no shortness of breath, some increased sedation , low back pain , no falls. Pertinent negatives per HPI. All others negative.   History   Social History  . Marital Status: Married    Spouse Name: Alveta Heimlich  . Number of Children: 1  . Years of Education: 12   Occupational History  . Retired [employer]   Social History Main Topics  . Smoking status: Former Research scientist (life sciences)  . Smokeless tobacco: Never Used  . Alcohol Use: No  . Drug Use: No  . Sexual Activity: Not on file   Other Topics Concern  . Not on file   Social History Narrative   Patient is married. Alveta Heimlich   Patient has a daughter.   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    Caffeine use: none     Family History  Problem Relation Age of Onset  . Dementia Neg Hx     Past Medical History  Diagnosis Date  . High cholesterol   . Cognitive decline   . Dementia     Past Surgical History  Procedure Laterality Date  . Colon surgery      Current Outpatient Prescriptions  Medication Sig Dispense Refill  . amLODipine (NORVASC) 5 MG tablet Take 5 mg by mouth daily.    Marland Kitchen aspirin EC 81 MG tablet Take 1 tablet (81 mg total) by mouth daily. 30 tablet 6  . cephALEXin (KEFLEX) 500 MG capsule Take 1 capsule (500 mg total) by mouth 2 (two) times daily. 14 capsule 0  . ciprofloxacin (CIPRO) 500 MG tablet Take 1 tablet (500 mg total) by mouth every 12 (twelve) hours. 14 tablet 0  . haloperidol (HALDOL) 0.5 MG tablet Take 1 tablet (0.5 mg total) by mouth 3 (three) times daily as needed for agitation. 60 tablet 6  . levothyroxine (SYNTHROID, LEVOTHROID) 50 MCG tablet Take 50 mcg by mouth daily before breakfast.    .  Memantine HCl-Donepezil HCl (NAMZARIC) 28-10 MG CP24 Take 1 capsule by mouth daily. 30 capsule 11  . Multiple Vitamin (MULTIVITAMIN WITH MINERALS) TABS tablet Take 1 tablet by mouth daily.    . risperiDONE (RISPERDAL) 1 MG tablet Take 0.5 tablets (0.5 mg total) by mouth 2 (two) times daily. 30 tablet 6  . atorvastatin (LIPITOR) 10 MG tablet Take 10 mg by mouth at bedtime.  6   No current facility-administered medications for this visit.    Allergies as of 08/30/2014 - Review Complete 08/30/2014  Allergen Reaction Noted  . Amoxicillin  10/23/2013  . Zolpidem tartrate  10/23/2013    Vitals: BP 170/105 mmHg  Pulse 81  Ht 5\' 8"  (1.727 m)  Wt 126 lb (57.153 kg)  BMI 19.16 kg/m2 Last Weight:  Wt Readings from Last 1 Encounters:  08/30/14 126 lb (57.153 kg)   Last Height:   Ht Readings from Last 1 Encounters:  08/30/14 5\' 8"  (1.727 m)   General: NAD, she is alert but less talkative and less well groomed than she was before Speech: Fluent  Cranial Nerves:  The pupils are equal, round, and reactive to light. Visual fields are full to finger confrontation. Extraocular movements are intact. Trigeminal sensation is intact and the muscles of mastication are normal. The face is symmetric. The palate elevates in the midline. Voice is normal. Shoulder shrug is normal. The tongue has normal motion without fasciculations.    Motor Observation:  Mild postural tremor Tone:  Normal muscle tone.   Posture:  Posture is normal.    Strength:  Strength is V/V in the upper and lower limbs.  Gait: Not ataxic     Assessment/Plan: 81 year old female who is here for follow up of Dementia with behavioral disturbances and delusions. Significant decline in the last several months. Stopped taking all her medications. MoCA 12/30. Husband very confused as well. She lives with her husband of 61 years and they live independently. She was given Risperdal bid with haldol as needed as she  was having sever delusions, wandering, getting lost, calling the police, severely agitated. This improved after she was treated for UTI. Now she appears too sedated. Husband has been giving her the haldol twice daily instead of the Risperdal. And giving Risperdal at night in addition. Discussed at length with patient and daughter, this was not as medication was prescribed.   I discussed that Risperdal is a newer medication and has less side effects than the Haldol. Haldol was prescribed only as absolutely needed for delusions or severe agitation. Husband has been giving patient haldol instead of Risperdal. I recommend Risperdal 0.5mg  twice daily and we can hopefully cut further back to 0.25mg  twice daily. We want to keep the medication at a bare minimum to avoid sedation and any other side effects. Also, there is a risk of death in the elderly taking Risperdal so want to use it as sparingly as possible, discussed at length with family. Haldol only if patient's agitation or confusion is excessive and not redirectable.  Continue Aricept and Namenda. Also continue ASA. BP elevated today, discussed restarting medication, call pcp. Discussed with daughter as well.  Will order XR of the coccyx due to point tenderness and recent fall.   Patient needs to stop driving. Discussed with daughter and husband and patient. Husband understands. Daughter says she will take away her keys and license.   Previous notes: 07/01/2014: Spoke to daughter at length on the phone. Mother is having severe behavioural distrubances. She agrees with father and would like something in the evenings to help patient with delusions due to dementia. Daughter is willing to help her father dose the Namenda. Father and daughter very much would like the Namenda added to the Aricept. I'm concerned that patient's husband is also very confused, that they should try to minimize the medications to the ones she completely needs which will involve help  from primary care as well. I am unsure husband can manage this, daughter says she will help. I advised looking into assisted living or aids to help them.  Spoke with pharmacist. Patient lost Aricept, doesn't even remember coming to get it. Husband came back with patient and had all patient's old pill bottles, asking for refills, asking what each medication is for. Very confused. Let  daughter know and again expressed concern to daughter and patient's husband.   Sarina Ill, MD  [facility] [address] [place] [place], [place] [address]  Phone [phone] Fax [phone]  A total of 45 minutes minutes was spent face-to-face with this patient. Over half this time was spent on counseling patient, husband and daughter on the dementia diagnosis and different diagnostic and therapeutic options available.

## 2014-08-30 NOTE — Patient Instructions (Signed)
Overall you are doing fairly well but I do want to suggest a few things today:   Remember to drink plenty of fluid, eat healthy meals and do not skip any meals. Try to eat protein with a every meal and eat a healthy snack such as fruit or nuts in between meals. Try to keep a regular sleep-wake schedule and try to exercise daily, particularly in the form of walking, 20-30 minutes a day, if you can.   As far as your medications are concerned, I would like to suggest  Riserdal (white pill) 1/2 twice daily Hold the haldol only as needed (blue pill)  As far as diagnostic testing: Xray of coccyx due to fall [facility]   I would like to see you back in 3 months, sooner if we need to. Please call us with any interim questions, concerns, problems, updates or refill requests.   Please also call us for any test results so we can go over those with you on the phone.  My clinical assistant and will answer any of your questions and relay your messages to me and also relay most of my messages to you.   Our phone number is [phone]. We also have an after hours call service for urgent matters and there is a physician on-call for urgent questions. For any emergencies you know to call 911 or go to the nearest emergency room

## 2014-08-30 NOTE — Telephone Encounter (Signed)
Spoke with [facility] imaging to see if pt can come in today for Xray of coccyx and they stated they take walk-ins from 330-430 and pt can come as long as they make it before 430pm. I informed pt and husband and gave them instructions and phone number for [facility] imaging.

## 2014-08-31 ENCOUNTER — Telehealth: Payer: Self-pay | Admitting: Neurology

## 2014-08-31 NOTE — Telephone Encounter (Signed)
Spoke to patient's daughter regarding constipation and fracture through the fifth sacral segment and first coccygeal segment. Discussed follow up CT scan but daughter feels that possibly that mother is tolerating well. Unclear if there would be any treatment anyway. Will follow clinically. Daughter will take patient to pcp for management of constipation. Also BP was elevated during office visit, daughter will address with pcp as well.

## 2014-09-05 DIAGNOSIS — R4689 Other symptoms and signs involving appearance and behavior: Secondary | ICD-10-CM | POA: Insufficient documentation

## 2014-09-08 ENCOUNTER — Telehealth: Payer: Self-pay | Admitting: Neurology

## 2014-09-08 ENCOUNTER — Other Ambulatory Visit: Payer: Self-pay | Admitting: Neurology

## 2014-09-08 NOTE — Telephone Encounter (Signed)
Spoke with daughter. Father is mismanaging patient's medications and not giving her medications as prescribed and overmedicating patient. I spoke to daughter, patient fell again. Dr. Bea Graff recommended admission inpatient which I highly encouraged. Husband apparently declined.

## 2014-09-08 NOTE — Telephone Encounter (Signed)
Patient's daughter [name]) called stating patient saw her PCP today and is admitting her to the hospital. She wants to speak with Dr Jaynee Eagles. She did not wish to go into details. Please call and advise. She can be reached at [phone].

## 2014-09-10 ENCOUNTER — Telehealth: Payer: Self-pay | Admitting: *Deleted

## 2014-09-10 ENCOUNTER — Telehealth: Payer: Self-pay | Admitting: Neurology

## 2014-09-10 ENCOUNTER — Other Ambulatory Visit: Payer: Self-pay | Admitting: Neurology

## 2014-09-10 ENCOUNTER — Other Ambulatory Visit (INDEPENDENT_AMBULATORY_CARE_PROVIDER_SITE_OTHER): Payer: Self-pay

## 2014-09-10 DIAGNOSIS — R410 Disorientation, unspecified: Secondary | ICD-10-CM

## 2014-09-10 DIAGNOSIS — S1980XA Other specified injuries of unspecified part of neck, initial encounter: Secondary | ICD-10-CM

## 2014-09-10 DIAGNOSIS — S3991XA Unspecified injury of abdomen, initial encounter: Secondary | ICD-10-CM

## 2014-09-10 DIAGNOSIS — S0990XA Unspecified injury of head, initial encounter: Secondary | ICD-10-CM

## 2014-09-10 DIAGNOSIS — Z0289 Encounter for other administrative examinations: Secondary | ICD-10-CM

## 2014-09-10 DIAGNOSIS — Y92009 Unspecified place in unspecified non-institutional (private) residence as the place of occurrence of the external cause: Secondary | ICD-10-CM

## 2014-09-10 DIAGNOSIS — S3210XA Unspecified fracture of sacrum, initial encounter for closed fracture: Secondary | ICD-10-CM

## 2014-09-10 DIAGNOSIS — W19XXXA Unspecified fall, initial encounter: Secondary | ICD-10-CM

## 2014-09-10 NOTE — Telephone Encounter (Signed)
Thank you :)

## 2014-09-10 NOTE — Telephone Encounter (Signed)
Spoke with daughter to let her know pt can come to our office to have lab work/urinalysis done and we will talk to them about where they can go for CT imaging. I told her [facility] [place] is working on Print production planner approved and we will let them know the options once they get to the office. She stated pt and husband already on the way to our office. I told her to call back if she needed anything else. She verbalized understanding.

## 2014-09-10 NOTE — Telephone Encounter (Signed)
Patient was seen by Dr. Bea Graff who is her primary care yesterday after a fall. This is patient's second fall in approximately 2 weeks. 2 weeks ago patient fell out of the bathtub and fractured her sacrum. Apparently, patient fell 2 days ago and was seen by her primary care yesterday. I spoke with Dr. Bea Graff. It was noted by daughter and husband that Dr. Bea Graff found patient was somewhat confused, possibly secondary to overmedication., had decreased range of motion in the neck, neck pain, bruising in the right abdominal area. Dr. Bea Graff recommended that patient be brought to the ED that day and he called the [facility] emergency room and spoke with the attending physician. Husband declined did not want patient admitted and took her home. By report, today patient still appears altered, with continued neck pain, with new onset abdominal pain. We will order a CT of the head and neck to evaluate for trauma or intracerebral hematoma or hemorrhage, we'll also order a CT of the abdomen and pelvis to evaluate for any other sacral or pelvic fractures, internal bleeding. We'll order CMP, CBC, urinalysis to evaluate for infectious causes of altered mentation.  Patient's daughter has been helping her parents and trying to help her father manage medications and medical treatment. Very difficult situation as patient has dementia with behavioral disturbances, husband has been trying to keep patient at home living with him but is having a difficult time controlling her behavior. Husband has been overmedicating patient and not giving her medications as prescribed, especially antipsychotics. I feel that husband's intentions are good however he too is confused on her medication regimen and he is struggling with the situation of his wife of approximately 71 years. I have highly encouraged daughter and husband to consider nursing care.  I have discussed the situation with the daughter and husband at length, at this time on discontinuing all  medications as they have not been given to patient as prescribed. I recommend daughter bringing patient and his wife to the office for an appointment so we can discuss the situation.

## 2014-09-11 LAB — URINALYSIS, ROUTINE W REFLEX MICROSCOPIC
Bilirubin, UA: NEGATIVE
Glucose, UA: NEGATIVE
Ketones, UA: NEGATIVE
NITRITE UA: NEGATIVE
SPEC GRAV UA: 1.012 (ref 1.005–1.030)
Urobilinogen, Ur: 0.2 mg/dL (ref 0.2–1.0)
pH, UA: 6.5 (ref 5.0–7.5)

## 2014-09-11 LAB — COMPREHENSIVE METABOLIC PANEL
A/G RATIO: 1.7 (ref 1.1–2.5)
ALT: 17 IU/L (ref 0–32)
AST: 30 IU/L (ref 0–40)
Albumin: 4.1 g/dL (ref 3.5–4.8)
Alkaline Phosphatase: 73 IU/L (ref 39–117)
BILIRUBIN TOTAL: 0.6 mg/dL (ref 0.0–1.2)
BUN/Creatinine Ratio: 12 (ref 11–26)
BUN: 13 mg/dL (ref 8–27)
CO2: 24 mmol/L (ref 18–29)
Calcium: 9.4 mg/dL (ref 8.7–10.3)
Chloride: 98 mmol/L (ref 97–108)
Creatinine, Ser: 1.11 mg/dL — ABNORMAL HIGH (ref 0.57–1.00)
GFR calc Af Amer: 55 mL/min/{1.73_m2} — ABNORMAL LOW (ref >59)
GFR, EST NON AFRICAN AMERICAN: 48 mL/min/{1.73_m2} — AB (ref >59)
GLUCOSE: 109 mg/dL — AB (ref 65–99)
Globulin, Total: 2.4 g/dL (ref 1.5–4.5)
POTASSIUM: 3.8 mmol/L (ref 3.5–5.2)
Sodium: 140 mmol/L (ref 134–144)
Total Protein: 6.5 g/dL (ref 6.0–8.5)

## 2014-09-11 LAB — CBC
HEMOGLOBIN: 14 g/dL (ref 11.1–15.9)
Hematocrit: 41.6 % (ref 34.0–46.6)
MCH: 30.8 pg (ref 26.6–33.0)
MCHC: 33.7 g/dL (ref 31.5–35.7)
MCV: 92 fL (ref 79–97)
Platelets: 240 10*3/uL (ref 150–379)
RBC: 4.54 x10E6/uL (ref 3.77–5.28)
RDW: 14.6 % (ref 12.3–15.4)
WBC: 9.7 10*3/uL (ref 3.4–10.8)

## 2014-09-11 LAB — MICROSCOPIC EXAMINATION: CASTS: NONE SEEN /LPF

## 2014-09-12 LAB — URINE CULTURE

## 2014-09-13 ENCOUNTER — Ambulatory Visit
Admission: RE | Admit: 2014-09-13 | Discharge: 2014-09-13 | Disposition: A | Discharge status: Home / Self Care | Payer: Medicare Other | Source: Ambulatory Visit | Attending: Neurology | Admitting: Neurology

## 2014-09-13 ENCOUNTER — Ambulatory Visit: Payer: Self-pay | Admitting: Neurology

## 2014-09-13 ENCOUNTER — Ambulatory Visit (INDEPENDENT_AMBULATORY_CARE_PROVIDER_SITE_OTHER): Payer: Medicare Other | Admitting: Neurology

## 2014-09-13 ENCOUNTER — Telehealth: Payer: Self-pay | Admitting: *Deleted

## 2014-09-13 ENCOUNTER — Encounter: Payer: Self-pay | Admitting: Neurology

## 2014-09-13 VITALS — BP 157/92 | HR 96 | Temp 98.4°F | Ht 68.0 in | Wt 128.8 lb

## 2014-09-13 DIAGNOSIS — W19XXXA Unspecified fall, initial encounter: Secondary | ICD-10-CM

## 2014-09-13 DIAGNOSIS — R269 Unspecified abnormalities of gait and mobility: Secondary | ICD-10-CM | POA: Diagnosis not present

## 2014-09-13 DIAGNOSIS — S1980XA Other specified injuries of unspecified part of neck, initial encounter: Secondary | ICD-10-CM

## 2014-09-13 DIAGNOSIS — F03918 Unspecified dementia, unspecified severity, with other behavioral disturbance: Secondary | ICD-10-CM

## 2014-09-13 DIAGNOSIS — Z7409 Other reduced mobility: Secondary | ICD-10-CM | POA: Diagnosis not present

## 2014-09-13 DIAGNOSIS — Y92009 Unspecified place in unspecified non-institutional (private) residence as the place of occurrence of the external cause: Secondary | ICD-10-CM

## 2014-09-13 DIAGNOSIS — M25511 Pain in right shoulder: Secondary | ICD-10-CM

## 2014-09-13 DIAGNOSIS — S0990XA Unspecified injury of head, initial encounter: Secondary | ICD-10-CM

## 2014-09-13 DIAGNOSIS — S3210XA Unspecified fracture of sacrum, initial encounter for closed fracture: Secondary | ICD-10-CM

## 2014-09-13 DIAGNOSIS — S3991XA Unspecified injury of abdomen, initial encounter: Secondary | ICD-10-CM

## 2014-09-13 DIAGNOSIS — Z789 Other specified health status: Secondary | ICD-10-CM

## 2014-09-13 DIAGNOSIS — Z659 Problem related to unspecified psychosocial circumstances: Secondary | ICD-10-CM

## 2014-09-13 DIAGNOSIS — F0391 Unspecified dementia with behavioral disturbance: Secondary | ICD-10-CM

## 2014-09-13 DIAGNOSIS — R419 Unspecified symptoms and signs involving cognitive functions and awareness: Secondary | ICD-10-CM

## 2014-09-13 DIAGNOSIS — W19XXXD Unspecified fall, subsequent encounter: Secondary | ICD-10-CM

## 2014-09-13 MED ORDER — QUETIAPINE FUMARATE 25 MG PO TABS: 25.0000 mg | ORAL_TABLET | Freq: Every day | ORAL | Status: DC

## 2014-09-13 NOTE — Telephone Encounter (Signed)
Spoke with daughter, [name] and I told her there was a 2:30pm appt available and I offered her this. She stated she would need to call her father to make sure this was okay and to call her back in 10 minutes. I told her I would do this.

## 2014-09-13 NOTE — Progress Notes (Addendum)
[facility]    Provider:  Dr Jaynee Eagles Referring Provider: Raina Mina., MD Primary Care Physician:  Gilford Rile, MD  CC: Dementia with behavioral disturbance HPI:  Brooke Watson is a 81 y.o. female here as a follow up for Dementia with behavioral disturbance.   Husband accompanies patient and provides most of the information. They have been married for approximately 61 years and the husband is trying desperately to keep his wife at home. Recently she has become more forgetful, and often agitated and running to the police when she doesn't recognize her husband, wandering. Have been talking with patient's husband and patient's daughter extensively in recent months and recommending that patient placed in a care facility. Husband is quite earnest however he himself is having difficulty managing medications and managing patient. Patient was given Risperdal bid to help manage patient's delusions with prn Haldol ONLY if patient became aggressive, however husband was overdosing patient which likely contributed to falls as well extrapyramidal symptoms. Patient has had several falls recently, and today presents with neck pain and a new lesion on her right collarbone.  Patient is getting 0.5mg  Risperdal daily in the morning for agitation. Her neck hurts today since a fall. She fell a week ago on Monday. She had CT of the neck and sacrum 2 days ago which did not show fractures. This morning husband noticed a bruise on her collar bone. Her neck is very sore. She is not having behavioral issues anymore. She can't move her neck though. No tremors or stiffness in the rest of the body. She takes benadryl every night for sleep and it doesn't help with the neck stiffness. Husband tried to stop the risperdal a few weeks ago and she started becoming agitated, wanting to go outside and walk, wanted to see her deceased mother. He restarted the Risperdal.  She appears to be having a dystonic  reaction in her neck today. She has a red lesion on her right collar bone unclear if it is a new bruise or burn.   Will stop the risperdal and haldol and dispose of meds. Will start low dose Seroquel as this has fewer EPS side effects. Will start at 25mg  bid and slowly increase to 50mg  bid if needed.    Review of Systems: Patient complains of symptoms per HPI as well as the following symptoms: neck pain, no CP, no SOB. Pertinent negatives per HPI. All others negative.   History   Social History  . Marital Status: Married    Spouse Name: Alveta Heimlich  . Number of Children: 1  . Years of Education: 12   Occupational History  . Retired [employer]   Social History Main Topics  . Smoking status: Former Research scientist (life sciences)  . Smokeless tobacco: Never Used  . Alcohol Use: No  . Drug Use: No  . Sexual Activity: Not on file   Other Topics Concern  . Not on file   Social History Narrative   Patient is married. Alveta Heimlich   Patient has a daughter.   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    Caffeine use: none     Family History  Problem Relation Age of Onset  . Dementia Neg Hx     Past Medical History  Diagnosis Date  . High cholesterol   . Cognitive decline   . Dementia     Past Surgical History  Procedure Laterality Date  . Colon surgery  Current Outpatient Prescriptions  Medication Sig Dispense Refill  . aspirin EC 81 MG tablet Take 1 tablet (81 mg total) by mouth daily. 30 tablet 6  . ibuprofen (ADVIL,MOTRIN) 200 MG tablet Take 400 mg by mouth every 6 (six) hours as needed.     Marland Kitchen levothyroxine (SYNTHROID, LEVOTHROID) 50 MCG tablet Take 50 mcg by mouth daily before breakfast.    . Memantine HCl-Donepezil HCl (NAMZARIC) 28-10 MG CP24 Take 1 capsule by mouth daily. 30 capsule 11  . risperiDONE (RISPERDAL) 1 MG tablet Take 0.5 mg by mouth daily.  6  . amLODipine (NORVASC) 5 MG tablet Take 5 mg by mouth daily.    Marland Kitchen atorvastatin (LIPITOR)  10 MG tablet Take 10 mg by mouth at bedtime.  6  . haloperidol (HALDOL) 0.5 MG tablet Take 0.5 mg by mouth as needed.  6   No current facility-administered medications for this visit.    Allergies as of 09/13/2014 - Review Complete 09/13/2014  Allergen Reaction Noted  . Amoxicillin  10/23/2013  . Zolpidem tartrate  10/23/2013    Vitals: BP 157/92 mmHg  Pulse 96  Temp(Src) 98.4 F (36.9 C)  Ht 5\' 8"  (1.727 m)  Wt 128 lb 12.8 oz (58.423 kg)  BMI 19.59 kg/m2 Last Weight:  Wt Readings from Last 1 Encounters:  09/13/14 128 lb 12.8 oz (58.423 kg)   Last Height:   Ht Readings from Last 1 Encounters:  09/13/14 5\' 8"  (1.727 m)    Gen: NAD, not conversant, poorly groomed                     CV: RRR, no MRG.   Psychomotor slowing. Head and neck in a dystonic posture possibly secondary due to Risperdal.   Speech:    No aphasia Cognition:    The patient is oriented to person, her own birthday, doesn't know husband's birthday or daughter's birthday, doesn't know month or year. recent and remote memory impaired;    Cranial Nerves:    The pupils are equal, round, and reactive to light. Visual fields are full to threat. Extraocular movements are intact. Trigeminal sensation is intact and the muscles of mastication are normal. The face is symmetric. The palate elevates in the midline. Hearing intact. Voice is normal. Shoulder shrug is normal. The tongue has normal motion without fasciculations.    Gait:    Slightly shuffling  Motor Observation:    Mild tremor  Tone:    Increased muscle tone neck with dystonic posturing     Assessment/Plan:   81 year old female who is here for follow up of Dementia with behavioral disturbances and delusions. She is having delusions, wandering, getting lost, calling the police, severely agitated at times.Significant decline in the last several months. She lives with her husband of 63 years and they live independently. She was given Risperdal bid  with haldol sparingly only as needed as she was having severe agitation that was not redirectable. Management initially worked however patient became increasingly sedated with falls as husband was not giving patient medication as prescribed and was over medicating patient. Today we will dispose of risperdal and haldol and prescribe only Seroquel low-dose twice daily. This medication has fewer EPS side effects as patient appears to have dystonic head posturing today. Will stop the risperdal and haldol and dispose of meds per husband's request. Will start low dose Seroquel as this has fewer EPS side effects which she appears to be having. Will start at 25mg  bid  and slowly increase if needed.  Have been trying to encourage daughter and husband to place patient in a facility for many months now. Daughter and husband are resistant. Have also recommended going to the emergency room on many occasions. Have encouraged follow up with primary care for evaluation of other metabolic/toxic etiologies for increased confusion including UTI and they have been to see him recently. She was treated for UTI.  Tried to discuss with patient and daughter that antipsychotic medications can help in dementia with behavioral disturbances however these medications carry significant risk of sedation, falls, extrapyramidal symptoms, as well as an increased risk of death in the elderly. We need to use these medications sparingly and if patient cannot be safely managed at home they need to consider placing the patient unfortunately in the facility where she could be cared for 24 x 7 safely.    PT/OT  Needs home safety assessment   Sarina Ill, MD  [facility] [address] [place], [place] [address]  Phone [phone] Fax [phone]  A total of 30 minutes was spent face-to-face with this patient. Over half this time was spent on counseling patient on the dementia with behavioral disturbance  diagnosis and different diagnostic and therapeutic options available.

## 2014-09-13 NOTE — Telephone Encounter (Signed)
Spoke with [facility] and made them aware pt on their way to get xray clavicle. She stated a walk-in was okay and see could see the order Dr. Jaynee Eagles placed.

## 2014-09-13 NOTE — Telephone Encounter (Signed)
I called and spoke with Brooke Watson from pt [facility] about not refilling her Risperdal and haldol. Brooke Watson stated both medications are inactivated and cannot be refilled.

## 2014-09-13 NOTE — Telephone Encounter (Signed)
Called and spoke with daughter, [name], and offered her an 11:30pm appt and she stated this will be okay and will let her father and pt know. She also stated she would like Dr. Jaynee Eagles to consider a PT/OT consult and if there was still a sacral fracture present. Daughter also stated they weaned pt off of haldol and her last dose was 09/07/14. They also cut back on her risperdol to 0.5mg  at night only on 09/11/14.  Daughter also stated her father has been giving her advil, 2-3 tabs every day this past weekend for her neck discomfort and this has seemed to help.   [name] stated Dr. Jaynee Eagles can call her when they are in the room so she can also be a part of appt if she wants. I told her I would let Dr. Jaynee Eagles know. She verbalized understanding.  I also called husband, Alveta Heimlich to have him bring all of pt medications in a bag that she takes so we can go over each medication. Husband verbalized understanding.

## 2014-09-15 ENCOUNTER — Telehealth: Payer: Self-pay | Admitting: Neurology

## 2014-09-15 NOTE — Telephone Encounter (Signed)
LMVM to inform daughter in response to the VM she left for me about the status of her mothers referral to [facility]. My message informed patient that there was not a referral place at her visit on 09/13/14 and that she needs to contact the office back and Leave a message or speak with the providers nurse about the status of getting a referral placed for her mother.

## 2014-09-17 ENCOUNTER — Telehealth: Payer: Self-pay | Admitting: Neurology

## 2014-09-17 ENCOUNTER — Other Ambulatory Visit: Payer: Self-pay | Admitting: Neurology

## 2014-09-17 DIAGNOSIS — F0391 Unspecified dementia with behavioral disturbance: Secondary | ICD-10-CM

## 2014-09-17 DIAGNOSIS — F03918 Unspecified dementia, unspecified severity, with other behavioral disturbance: Secondary | ICD-10-CM

## 2014-09-17 MED ORDER — QUETIAPINE FUMARATE 25 MG PO TABS: 25.0000 mg | ORAL_TABLET | Freq: Two times a day (BID) | ORAL | Status: DC

## 2014-09-17 NOTE — Telephone Encounter (Signed)
Patient's daughter(Bobo) called stating the QUEtiapine (SEROQUEL) 25 MG tablet wants to increase to 50mg . She states the patient is more alert but she has  increased confusion. Please call and advise. She can be reached at [phone].

## 2014-09-17 NOTE — Telephone Encounter (Signed)
Spoke to daughter, mother is doing much better. She is more alert. She went to church and had her hair done. Patient is becoming delusional in the evenings, I am OK with increasing seroquel to 25mg  bid but again I cautioned against over sedating patient and side effects of seroquel inckuding sedation, EPS, falls and the risk of death in the elderly on these types of medications. Father is requesting Haldol, I strongly advise against that as he was over medicating patient before and the older anti-psychotics have increased risk of side effects. Will not increase Seroquel any further until seeing patient in clinic. Her neck is better, wonder if she was having a dystonic reaction to the risperdal which is why we changed to seroquel. Again, advised caution. If patient is confused on where she is, I recommend reorienting patient.

## 2014-09-18 ENCOUNTER — Telehealth: Payer: Self-pay | Admitting: Neurology

## 2014-09-18 ENCOUNTER — Other Ambulatory Visit: Payer: Self-pay | Admitting: Neurology

## 2014-09-18 DIAGNOSIS — F0391 Unspecified dementia with behavioral disturbance: Secondary | ICD-10-CM

## 2014-09-18 DIAGNOSIS — F03918 Unspecified dementia, unspecified severity, with other behavioral disturbance: Secondary | ICD-10-CM

## 2014-09-18 NOTE — Telephone Encounter (Signed)
Spoke to daughter. Mother is agitated today. They would like antibiotics in case she has a UTI. I declined, cannot prescribed antibiotics without evaluation. Highly encouraged husband to take patient to the ED for evaluation of metabolic/toxic causes of increased agitation. The ED may chose to increase seroquel after their evaluation and I can see her in clinic next week. However, I recommend she be admitted so medication can be adjusted inpatient.

## 2014-09-20 ENCOUNTER — Other Ambulatory Visit (INDEPENDENT_AMBULATORY_CARE_PROVIDER_SITE_OTHER): Payer: Self-pay

## 2014-09-20 ENCOUNTER — Other Ambulatory Visit: Payer: Self-pay | Admitting: Neurology

## 2014-09-20 DIAGNOSIS — N390 Urinary tract infection, site not specified: Secondary | ICD-10-CM

## 2014-09-20 DIAGNOSIS — Z0289 Encounter for other administrative examinations: Secondary | ICD-10-CM

## 2014-09-20 NOTE — Telephone Encounter (Signed)
Patient had urinalysis today. She looked more alert. Husband still pushing for haldol instead of seroquel. There are increased risks with these medications, especially the older antipsychotics. I will discuss with daughter. Prefer to wait and see Dr. Casimiro Needle. Will discuss with daughter.

## 2014-09-21 ENCOUNTER — Other Ambulatory Visit: Payer: Self-pay | Admitting: Neurology

## 2014-09-21 LAB — URINALYSIS, ROUTINE W REFLEX MICROSCOPIC
Bilirubin, UA: NEGATIVE
Glucose, UA: NEGATIVE
Ketones, UA: NEGATIVE
LEUKOCYTES UA: NEGATIVE
NITRITE UA: NEGATIVE
PH UA: 7 (ref 5.0–7.5)
Specific Gravity, UA: 1.015 (ref 1.005–1.030)
Urobilinogen, Ur: 0.2 mg/dL (ref 0.2–1.0)

## 2014-09-21 LAB — MICROSCOPIC EXAMINATION

## 2014-09-21 MED ORDER — HALOPERIDOL 0.5 MG PO TABS: 0.2500 mg | ORAL_TABLET | Freq: Two times a day (BID) | ORAL | Status: DC | PRN

## 2014-09-21 NOTE — Telephone Encounter (Signed)
Please call daughter at [phone] and let her know urinalysis was negative for any infection. Normal.

## 2014-09-21 NOTE — Telephone Encounter (Signed)
Patients daughter called and stated that she was waiting for Rx. Haldol and the pharmacy stated that they have not yet received it. Please call and advise.

## 2014-09-21 NOTE — Telephone Encounter (Signed)
Spoke with daughter, [name], and told her the uranalysis was negative and found no infection. Daughter stated she called mother and she sounded like she had improved. She spoke with father and he is now giving her 50mg  Seroquel instead of the 25mg . I also told her Dr. Jaynee Eagles filled rx for Haldol .5mg  but would like pt to take .25mg  (1/2 tablet) and to be used only for extreme agitation. Told her she filled 5 pills with no refills. Daughter verbalized understanding.

## 2014-09-22 ENCOUNTER — Telehealth: Payer: Self-pay

## 2014-09-22 ENCOUNTER — Other Ambulatory Visit: Payer: Self-pay

## 2014-09-22 DIAGNOSIS — F03918 Unspecified dementia, unspecified severity, with other behavioral disturbance: Secondary | ICD-10-CM

## 2014-09-22 DIAGNOSIS — F0391 Unspecified dementia with behavioral disturbance: Secondary | ICD-10-CM

## 2014-09-22 MED ORDER — QUETIAPINE FUMARATE 50 MG PO TABS: 50.0000 mg | ORAL_TABLET | Freq: Two times a day (BID) | ORAL | Status: DC

## 2014-09-22 NOTE — Telephone Encounter (Addendum)
It appears Dr Jaynee Eagles already sent a new Rx for Seroquel 25mg  tabs one twice daily (50mg  total) on 06/10.  I called the pharmacy to verify they did receive the Rx.  I spoke with Pharmacist, Joe.  He said they did get the Rx for Seroquel 25mg  one bid, however, daughter advised them she does not wish to get this Rx, as it should be for two tabs twice daily.  Previous note from 06/10 says okay with increasing to 25mg  twice daily, and will not increase further intil seen in clinic.  I called Tina back.  Got no answer.  Left message.

## 2014-09-22 NOTE — Telephone Encounter (Signed)
Patients daughter(Tina) called requesting refill for QUEtiapine (SEROQUEL) 25 MG tablet with new dose of 50mg . She states her mother is doing much better with this dose. She is calm, awake, alert, able to converse, walking with her sister, shower by herself. She can be reached at [phone].

## 2014-09-22 NOTE — Telephone Encounter (Signed)
Rx updated to 50mg  bid, and sent to the pharmacy.  I called Ms [name] back, got no answer.  Left message.

## 2014-09-22 NOTE — Telephone Encounter (Signed)
Thank you :)

## 2014-09-22 NOTE — Telephone Encounter (Signed)
Spoke with daughter, [name] and told her Seroquel medication was fixed to be 50mg  in the morning and at night. I also made f/u appt for pt on 09/30/14 at 12:00pm. Tina aware and will let pt know to arrive at 11:45pm.

## 2014-09-22 NOTE — Telephone Encounter (Signed)
This is fine, please prescribe thank you !!

## 2014-09-22 NOTE — Telephone Encounter (Signed)
Patients daughter returned call. Please call and advise.

## 2014-09-22 NOTE — Telephone Encounter (Signed)
I called back and spoke with [name].  She said the patient has been taking Seroquel 50mg  twice daily (Instead of 25mg  twice daily as prescribed).  Says pt is doing quite well on this dose, and is requesting a new Rx for this dose change.  Note from 06/10 says 25mg  bid.  Okay to change Rx?  Please advise.  Thank you!

## 2014-09-22 NOTE — Telephone Encounter (Signed)
Can you please call patient's husband and see if he can come in for a follow up appointment for Thursday the 23rd please? I want to make sure patient is doing well on the increased seroquel. Also please let daughter know about the appointment. Thank you ! Daughter: [phone].

## 2014-09-22 NOTE — Telephone Encounter (Signed)
Spoke with patients daughter to give her contact information for [facility] [phone].) She verbalizes understanding.

## 2014-09-22 NOTE — Telephone Encounter (Signed)
Spoke with [name], daughter, and tried to make f/u appt for next week 6/23. Offered morning f/u slots but she stated they cannot make it to these and needed around an 11pm time. I told her I would ask Dr. Jaynee Eagles.   She also stated Seroquel is not listed correctly in her chart. It is still stating 25mg  BID and needs to be 50mg . I told her I would clarify with Dr. Jaynee Eagles on how she wanted pt to take Rx and call her back about that and the f/u appt. She verbalized understanding.

## 2014-09-30 ENCOUNTER — Ambulatory Visit (INDEPENDENT_AMBULATORY_CARE_PROVIDER_SITE_OTHER): Payer: Medicare Other | Admitting: Neurology

## 2014-09-30 ENCOUNTER — Encounter: Payer: Self-pay | Admitting: Neurology

## 2014-09-30 VITALS — BP 161/99 | HR 69 | Temp 98.1°F | Ht 68.0 in | Wt 127.4 lb

## 2014-09-30 DIAGNOSIS — F0391 Unspecified dementia with behavioral disturbance: Secondary | ICD-10-CM

## 2014-09-30 DIAGNOSIS — F03918 Unspecified dementia, unspecified severity, with other behavioral disturbance: Secondary | ICD-10-CM

## 2014-09-30 NOTE — Progress Notes (Signed)
[facility]    Provider:  Dr Jaynee Eagles Referring Provider: Raina Mina., MD Primary Care Physician:  Gilford Rile, MD   CC: Dementia with behavioral disturbance   Update 09/30/2014: Brooke Watson is a 81 y.o. female here as a follow up for Dementia with behavioral disturbance.  Husband accompanies patient and provides most of the information. They have been married for approximately 87 years and the husband is trying desperately to keep his wife at home. Recently she has become more forgetful, and often agitated and running to the police when she doesn't recognize her husband, wandering. Have been talking with patient's husband and patient's daughter extensively in recent months and recommending that patient placed in a care facility. Husband is quite earnest however he himself is having difficulty managing medications and managing patient.  Her medications were changed from Risperdal to Seroquel twice daily due to extrapyramidal symptoms. Haldol was limited to 5 tabs per month and only to be given for extreme agitation or behavior that severe and not redirectable.   She had a "spell" Monday night, she didn't know husband, couldn't reorient her. He gave her extra seroquel and haldol. This happens once a month. Patient says "I don't even know what he is talking about". No falls. She is walking twice a week with sister a half mile. BP was 167/102 in the office but they have not restarted BP medications. She has some pain in the neck, tremor and some decreased ROM in the neck. Every once in a while she has "spells" and she she gets agitated, wants to leave the house and "go home". She is sleeping well at night, no wandering, not leaving the bedroom. Walking is fine. Patient says "I feel fine" except for the "crick in my neck". Neck hurts sometimes, tightness. Heating pad helps.   Highly encouraged f/u with pcp and restarting BP medications.    History 08/30/2014: Patient  was given Risperdal bid to help manage patient's delusions with prn Haldol ONLY if patient became aggressive, however husband was overdosing patient which likely contributed to falls as well extrapyramidal symptoms. Patient has had several falls recently, and today presents with neck pain and a new lesion on her right collarbone.  Patient is getting 0.5mg  Risperdal daily in the morning for agitation. Her neck hurts today since a fall. She fell a week ago on Monday. She had CT of the neck and sacrum 2 days ago which did not show fractures. This morning husband noticed a bruise on her collar bone. Her neck is very sore. She is not having behavioral issues anymore. She can't move her neck though. No tremors or stiffness in the rest of the body. She takes benadryl every night for sleep and it doesn't help with the neck stiffness. Husband tried to stop the risperdal a few weeks ago and she started becoming agitated, wanting to go outside and walk, wanted to see her deceased mother. He restarted the Risperdal.  She appears to be having a dystonic reaction in her neck today. She has a red lesion on her right collar bone unclear if it is a new bruise or burn.   Will stop the risperdal and haldol and dispose of meds. Will start low dose Seroquel as this has fewer EPS side effects. Will start at 25mg  bid and slowly increase to 50mg  bid if needed.  Review of Systems: Patient complains of symptoms per HPI as well as the following symptoms: neck pain, no CP, no SOB. Pertinent negatives per  HPI. All others negative.   History   Social History  . Marital Status: Married    Spouse Name: Alveta Heimlich  . Number of Children: 1  . Years of Education: 12   Occupational History  . Retired [employer]   Social History Main Topics  . Smoking status: Former Research scientist (life sciences)  . Smokeless tobacco: Never Used  . Alcohol Use: No  . Drug Use: No  . Sexual Activity: Not on file   Other Topics Concern  . Not on file   Social  History Narrative   Patient is married. Alveta Heimlich   Patient has a daughter.   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    Caffeine use: none     Family History  Problem Relation Age of Onset  . Dementia Neg Hx     Past Medical History  Diagnosis Date  . High cholesterol   . Cognitive decline   . Dementia     Past Surgical History  Procedure Laterality Date  . Colon surgery      Current Outpatient Prescriptions  Medication Sig Dispense Refill  . amLODipine (NORVASC) 5 MG tablet Take 5 mg by mouth daily.    Marland Kitchen aspirin EC 81 MG tablet Take 1 tablet (81 mg total) by mouth daily. 30 tablet 6  . atorvastatin (LIPITOR) 10 MG tablet Take 10 mg by mouth at bedtime.  6  . haloperidol (HALDOL) 0.5 MG tablet Take 0.5 tablets (0.25 mg total) by mouth 2 (two) times daily as needed for agitation. Only for severe agitation. 5 tablet 0  . ibuprofen (ADVIL,MOTRIN) 200 MG tablet Take 400 mg by mouth every 6 (six) hours as needed.     Marland Kitchen levothyroxine (SYNTHROID, LEVOTHROID) 50 MCG tablet Take 50 mcg by mouth daily before breakfast.    . Memantine HCl-Donepezil HCl (NAMZARIC) 28-10 MG CP24 Take 1 capsule by mouth daily. 30 capsule 11  . QUEtiapine (SEROQUEL) 50 MG tablet Take 1 tablet (50 mg total) by mouth 2 (two) times daily. 60 tablet 3  . risperiDONE (RISPERDAL) 1 MG tablet Take 1 mg by mouth daily.     No current facility-administered medications for this visit.    Allergies as of 09/30/2014 - Review Complete 09/30/2014  Allergen Reaction Noted  . Amoxicillin  10/23/2013  . Zolpidem tartrate  10/23/2013    Vitals: BP 167/102 mmHg  Pulse 73  Temp(Src) 98.1 F (36.7 C)  Ht 5\' 8"  (1.727 m)  Wt 127 lb 6.4 oz (57.788 kg)  BMI 19.38 kg/m2 Last Weight:  Wt Readings from Last 1 Encounters:  09/30/14 127 lb 6.4 oz (57.788 kg)   Last Height:   Ht Readings from Last 1 Encounters:  09/30/14 5\' 8"  (1.727 m)    Gen: NAD, not conversant, poorly  groomed  CV: RRR, no MRG.   Psychomotor slowing. Decreased ROM in the neck  Speech:  No aphasia Cognition:  The patient is oriented to person only Cranial Nerves:  The pupils are equal, round, and reactive to light. Visual fields are full to threat. Extraocular movements are intact. Trigeminal sensation is intact and the muscles of mastication are normal. The face is symmetric. The palate elevates in the midline. Hearing intact. Voice is normal. Shoulder shrug is normal. The tongue has normal motion without fasciculations.    Gait:  Slightly shuffling  Motor Observation:  Mild postural tremor  Tone:  normal tone in the extremities    Assessment/Plan: 80  year old female who is here for follow up of Dementia with behavioral disturbances and delusions. She is having delusions, wandering, getting lost, calling the police, severely agitated at times.Significant decline in the last several months. She lives with her husband of 71 years and they live independently. She was given Risperdal bid with haldol sparingly only as needed as she was having severe agitation that was not redirectable. Management initially worked however patient became increasingly sedated with falls as husband was not giving patient medication as prescribed and was over medicating patient. Risperdal was changed to Seroquel low-dose twice daily. Started Seroquel as this has fewer EPS side effects which she appears to be having. Will continue at 50mg  bid and slowly increase if needed. Haldol was limited to 5 tabs per month and only to be given for extreme agitation or behavior that severe and not redirectable.   Have been trying to encourage daughter and husband to place patient in a facility for many months now. Daughter and husband are resistant. Have also recommended going to the emergency room on many occasions. Have encouraged follow up with primary care for Bp management and   evaluation of other metabolic/toxic etiologies for increased confusion.  Tried to discuss with patient and daughter that antipsychotic medications can help in dementia with behavioral disturbances however these medications carry significant risk of sedation, falls, extrapyramidal symptoms, as well as an increased risk of death in the elderly. We need to use these medications sparingly and if patient cannot be safely managed at home they need to consider placing the patient unfortunately in the facility where she could be cared for 24 x 7 safely.  PT/OT and  home safety assessment ordered, discussed with daughter. Continue current meds and will keep a close eye on patient and husband.  Sarina Ill, MD  [facility] [address] [place], [place] [address]  Phone [phone] Fax [phone]  A total of 15 minutes was spent in with this patient and husband face-to-face. Over half this time was spent on counseling patient on the dementia diagnosis and different management options available.

## 2014-09-30 NOTE — Patient Instructions (Signed)
Overall you are doing fairly well but I do want to suggest a few things today:   Remember to drink plenty of fluid, eat healthy meals and do not skip any meals. Try to eat protein with a every meal and eat a healthy snack such as fruit or nuts in between meals. Try to keep a regular sleep-wake schedule and try to exercise daily, particularly in the form of walking, 20-30 minutes a day, if you can.   I would like to see you back in 3 months, sooner if we need to. Please call us with any interim questions, concerns, problems, updates or refill requests.   Please also call us for any test results so we can go over those with you on the phone.  My clinical assistant and will answer any of your questions and relay your messages to me and also relay most of my messages to you.   Our phone number is [phone]. We also have an after hours call service for urgent matters and there is a physician on-call for urgent questions. For any emergencies you know to call 911 or go to the nearest emergency room

## 2014-10-06 ENCOUNTER — Telehealth: Payer: Self-pay | Admitting: Neurology

## 2014-10-06 MED ORDER — HALOPERIDOL 0.5 MG PO TABS: 0.2500 mg | ORAL_TABLET | Freq: Two times a day (BID) | ORAL | Status: DC | PRN

## 2014-10-06 NOTE — Telephone Encounter (Signed)
Rx has been sent per instruction.  I called Ms [name] back.  Relayed providers message.  She expressed understanding and was agreeable to this plan.   Says the Namzaric is covered under ins, but is a $45 co-pay, so they are going to check and see if it will be less expensive to to get Aricept and Namenda separately instead of the combination drug.  They will call us back to let us know if anything further is needed regarding this.

## 2014-10-06 NOTE — Telephone Encounter (Signed)
Daughter is requesting a refill on Haldol.  Last OV note says: Haldol was limited to 5 tabs per month and only to be given for extreme agitation or behavior that severe and not redirectable. Rx was last sent on 06/14.  Okay to refill at this time?  Please advise.  Thank you.

## 2014-10-06 NOTE — Telephone Encounter (Signed)
Patient's daughter is calling because the patient needs a refill on medication haloperidol (HALDOL) 0.5 MG tablet that the patient takes for agitation. The patient has 2 pills left. Please call to [facility] in [place]. Thank you.

## 2014-10-06 NOTE — Telephone Encounter (Signed)
You can give them 10 Haldol tabs monthly with 3 refills. Same directions, 1/2 tab for extreme agitation. Please give instructions to pharmacy not to refill early. Thank you!

## 2014-12-02 ENCOUNTER — Ambulatory Visit: Payer: Medicare Other | Admitting: Neurology

## 2014-12-07 ENCOUNTER — Ambulatory Visit: Payer: Medicare Other | Admitting: Neurology

## 2014-12-27 ENCOUNTER — Ambulatory Visit: Payer: Medicare Other | Admitting: Neurology

## 2015-01-06 ENCOUNTER — Ambulatory Visit (INDEPENDENT_AMBULATORY_CARE_PROVIDER_SITE_OTHER): Payer: Medicare Other | Admitting: Neurology

## 2015-01-06 ENCOUNTER — Encounter: Payer: Self-pay | Admitting: Neurology

## 2015-01-06 ENCOUNTER — Telehealth: Payer: Self-pay | Admitting: *Deleted

## 2015-01-06 VITALS — BP 152/92 | HR 97 | Ht 68.0 in | Wt 139.0 lb

## 2015-01-06 DIAGNOSIS — F0391 Unspecified dementia with behavioral disturbance: Secondary | ICD-10-CM

## 2015-01-06 DIAGNOSIS — F03918 Unspecified dementia, unspecified severity, with other behavioral disturbance: Secondary | ICD-10-CM

## 2015-01-06 MED ORDER — HALOPERIDOL 0.5 MG PO TABS: 0.2500 mg | ORAL_TABLET | Freq: Two times a day (BID) | ORAL | Status: DC | PRN

## 2015-01-06 MED ORDER — QUETIAPINE FUMARATE 25 MG PO TABS: 25.0000 mg | ORAL_TABLET | Freq: Every day | ORAL | Status: DC

## 2015-01-06 MED ORDER — MEMANTINE HCL-DONEPEZIL HCL ER 28-10 MG PO CP24: 1.0000 | ORAL_CAPSULE | Freq: Every day | ORAL | Status: DC

## 2015-01-06 MED ORDER — QUETIAPINE FUMARATE 50 MG PO TABS: 50.0000 mg | ORAL_TABLET | Freq: Two times a day (BID) | ORAL | Status: DC

## 2015-01-06 NOTE — Telephone Encounter (Signed)
Called and spoke to [place] (daughter) and asked her to have pt come at 1245pm for 1pm appt per Dr. Jaynee Eagles request. She said she would call her Dad to let him know. She also wanted Dr. Jaynee Eagles to call her while they are in the office. I told her I would relay the message to Dr. Jaynee Eagles. She verbalized understanding.

## 2015-01-06 NOTE — Progress Notes (Addendum)
[facility]    Edmund Holcomb: Dr Jaynee Eagles Referring Brooke Watson: Brooke Mina., MD Primary Care Physician: Gilford Rile, MD   CC: Dementia with behavioral disturbance  Update 01/06/2015: Here for follow up. The medication is working. In the afternoon she may have a "spell" but nothing like the behavioral issues as previously. This is less confusing for father. She sundowns in the evening around 4pm but it is manageable. When he gives he a haldol it is managable. Sister helps, daughter talks to him twice a day. Her neck is a little stiff but it doesn't bother her.  Patient says "I am doing very well, no problems".  She is eating well and sleeping well. No more wandering. If she wants to walk, they go with her and she is fine in an hour.   She is on Seroquel 50mg  qam and 75mg qpm. Can consider changing to XR once daily version but hate to change medication regimen, patient's husband gets confused with changes - and may be more expensive. Haldol very low dose and very sparingly - only for severe agitation when she can't be redirected and her safety is of concern.    Update 09/30/2014: Brooke Watson is a 81 y.o. female here as a follow up for Dementia with behavioral disturbance. Husband accompanies patient and provides most of the information. They have been married for approximately 32 years and the husband is trying desperately to keep his wife at home. Recently she has become more forgetful, and often agitated and running to the police when she doesn't recognize her husband, wandering. Have been talking with patient's husband and patient's daughter extensively in recent months and recommending that patient placed in a care facility. Husband is quite earnest however he himself is having difficulty managing medications and managing patient.  Her medications were changed from Risperdal to Seroquel twice daily due to extrapyramidal symptoms. Haldol was limited to 5 tabs per month and  only to be given for extreme agitation or behavior that severe and not redirectable.   She had a "spell" Monday night, she didn't know husband, couldn't reorient her. He gave her extra seroquel and haldol. This happens once a month. Patient says "I don't even know what he is talking about". No falls. She is walking twice a week with sister a half mile. BP was 167/102 in the office but they have not restarted BP medications. She has some pain in the neck, tremor and some decreased ROM in the neck. Every once in a while she has "spells" and she she gets agitated, wants to leave the house and "go home". She is sleeping well at night, no wandering, not leaving the bedroom. Walking is fine. Patient says "I feel fine" except for the "crick in my neck". Neck hurts sometimes, tightness. Heating pad helps.   Highly encouraged f/u with pcp and restarting BP medications.    History 08/30/2014: Patient was given Risperdal bid to help manage patient's delusions with prn Haldol ONLY if patient became aggressive, however husband was overdosing patient which likely contributed to falls as well extrapyramidal symptoms. Patient has had several falls recently, and today presents with neck pain and a new lesion on her right collarbone.  Patient is getting 0.5mg  Risperdal daily in the morning for agitation. Her neck hurts today since a fall. She fell a week ago on Monday. She had CT of the neck and sacrum 2 days ago which did not show fractures. This morning husband noticed a bruise on her collar bone. Her  neck is very sore. She is not having behavioral issues anymore. She can't move her neck though. No tremors or stiffness in the rest of the body. She takes benadryl every night for sleep and it doesn't help with the neck stiffness. Husband tried to stop the risperdal a few weeks ago and she started becoming agitated, wanting to go outside and walk, wanted to see her deceased mother. He restarted the Risperdal.  She appears  to be having a dystonic reaction in her neck today. She has a red lesion on her right collar bone unclear if it is a new bruise or burn.   Will stop the risperdal and haldol and dispose of meds. Will start low dose Seroquel as this has fewer EPS side effects. Will start at 25mg  bid and slowly increase to 50mg  bid if needed.  Review of Systems: Patient complains of symptoms per HPI as well as the following symptoms: neck pain, no CP, no SOB. Pertinent negatives per HPI. All others negative.   Social History   Social History  . Marital Status: Married    Spouse Name: Alveta Heimlich  . Number of Children: 1  . Years of Education: 12   Occupational History  . Retired [employer]   Social History Main Topics  . Smoking status: Former Research scientist (life sciences)  . Smokeless tobacco: Never Used  . Alcohol Use: No  . Drug Use: No  . Sexual Activity: Not on file   Other Topics Concern  . Not on file   Social History Narrative   Patient is married. Alveta Heimlich   Patient has a daughter.   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    Caffeine use: none     Family History  Problem Relation Age of Onset  . Dementia Neg Hx     Past Medical History  Diagnosis Date  . High cholesterol   . Cognitive decline   . Dementia     Past Surgical History  Procedure Laterality Date  . Colon surgery      Current Outpatient Prescriptions  Medication Sig Dispense Refill  . amLODipine (NORVASC) 5 MG tablet Take 5 mg by mouth daily.    Marland Kitchen aspirin EC 81 MG tablet Take 1 tablet (81 mg total) by mouth daily. 30 tablet 6  . atorvastatin (LIPITOR) 10 MG tablet Take 10 mg by mouth at bedtime.  6  . haloperidol (HALDOL) 0.5 MG tablet Take 0.5 tablets (0.25 mg total) by mouth 2 (two) times daily as needed (Only for severe agitation). Do not exceed 10 tabs monthly 10 tablet 3  . ibuprofen (ADVIL,MOTRIN) 200 MG tablet Take 400 mg by mouth every 6 (six) hours as needed.     Marland Kitchen levothyroxine  (SYNTHROID, LEVOTHROID) 50 MCG tablet Take 50 mcg by mouth daily before breakfast.    . Memantine HCl-Donepezil HCl (NAMZARIC) 28-10 MG CP24 Take 1 capsule by mouth daily. 30 capsule 11  . QUEtiapine (SEROQUEL) 25 MG tablet Take 25 mg by mouth daily.    . QUEtiapine (SEROQUEL) 50 MG tablet Take 1 tablet (50 mg total) by mouth 2 (two) times daily. 60 tablet 3  . risperiDONE (RISPERDAL) 1 MG tablet Take 1 mg by mouth daily.     No current facility-administered medications for this visit.    Allergies as of 01/06/2015 - Review Complete 01/06/2015  Allergen Reaction Noted  . Amoxicillin  10/23/2013  . Zolpidem tartrate  10/23/2013    Vitals: BP  152/92 mmHg  Pulse 97  Ht 5\' 8"  (1.727 m)  Wt 139 lb (63.05 kg)  BMI 21.14 kg/m2 Last Weight:  Wt Readings from Last 1 Encounters:  01/06/15 139 lb (63.05 kg)   Last Height:   Ht Readings from Last 1 Encounters:  01/06/15 5\' 8"  (1.727 m)     Gen: NAD, more conversant, well groomedtoday, she is more alert today, hair is done CV: RRR  CV: RRR, no MRG.  Speech:  No aphasia Cognition:  The patient is oriented to person only Cranial Nerves:  The pupils are equal, round, and reactive to light. Visual fields are full to threat. Extraocular movements are intact. Trigeminal sensation is intact and the muscles of mastication are normal. The face is symmetric. The palate elevates in the midline. Hearing intact. Voice is normal. Shoulder shrug is normal. The tongue has normal motion without fasciculations.   Motor 5/5.  Gait:  Slightly shuffling  Motor Observation:  Mild postural tremor  Tone:  normal tone in the extremities. Increased cervical muscle tone.     Assessment/Plan: 81 year old female who is here for follow up of Dementia with behavioral disturbances and delusions. She is having delusions, wandering, getting lost, calling the police, severely agitated at times.Significant decline in the last  several months. She lives with her husband of 51 years and they live independently. She was given Risperdal bid with haldol sparingly only as needed as she was having severe agitation that was not redirectable. Management initially worked however patient became increasingly sedated with falls as husband was not giving patient medication as prescribed and was over medicating patient. Risperdal was changed to Seroquel twice daily. Started Seroquel as this has fewer EPS side effects which she appears to be having. Will continue at 50mg  bid and add extra 25mg  at night and slowly increase if needed.(Can consider changing to Seroquel once daily version but hate to change medication regimen, patient's husband gets confused with changes and is concerned with cost). Haldol was limited to 5 tabs per month and only to be given for extreme agitation or behavior that severe and not redirectable.   She is looking much better today, conversant, making jokes, well groomed, hair done nicely. She has some increased tone in the cervical area but this does not bother her and they do not want to discuss treatment. May be a dystonic reaction from the medication but it is not bothering her and there is no pain. And she is doing so well otherwise, will leave it as is and monitor.  Have been trying to encourage daughter and husband to place patient in a facility for many months now. Daughter and husband are resistant. Have also recommended going to the emergency room on many occasions. Have encouraged follow up with primary care for Bp management and evaluation of other metabolic/toxic etiologies for increased confusion.  Discussed multiple times with patient's husband and daughter that antipsychotic medications can help in dementia with behavioral disturbances however these medications carry significant risk of sedation, falls, extrapyramidal symptoms, as well as an increased risk of death in the elderly. We need to use these  medications sparingly and if patient cannot be safely managed at home they need to consider placing the patient unfortunately in a facility where she could be cared for 24 x 7 safely. They acknowledged understanding of risks on multiple occassions. Also have recommended multiple times a geriatric psychiatrist (Plovsky, Arfeen) and they have not pursued.  PT/OT and home safety assessment ordered  last applintment, discussed with daughter. Continue current meds and will keep a close eye on patient and husband.  Addendum 10/19: Spoke to daughter. This is a very difficult situation. Patient has advanced dementia and lives with her elderly husband. Daughter tries to help long distance and helps father manage pills by color and shape because patient's husband doesn't know the medication name. She has episodes of confusion where she is combative. She was doing well when I last saw her but recently had a UTI and since then she has been worsening. She is on Seroquel 50mg  qam and 75 mg qpm. She on a very small dose of Haldol very sparingly and only in severe cases of agitation where she may be in danger or cant be redirected. I have advised them multiple times to see a geriatric specialist in psychiatry. I also highly recommend a memory unit for patient. I'm not entirely sure that husband is giving patient her medications correclty. Patient has had several falls, feel she would benefit from PT at home, a PT safety home evaluation, skilled nursing to help with medical management at home. Will request [facility].    Sarina Ill, MD  [facility] [address] [place], [place] [address]  Phone [phone] Fax [phone]  A total of 15 minutes was spent face-to-face with this patient. Over half this time was spent on counseling patient on the dementia diagnosis and different diagnostic and therapeutic options available.

## 2015-01-17 ENCOUNTER — Telehealth: Payer: Self-pay | Admitting: Neurology

## 2015-01-17 NOTE — Telephone Encounter (Signed)
Patient's daughter is calling and states her mom got very confused because of a UTI.  She will call her PCP first to see if the UTI has been cleared up.  If this is not the source of her confusion she will call back.

## 2015-01-17 NOTE — Telephone Encounter (Signed)
Ok thanks. She needs to see pcp first.

## 2015-01-25 ENCOUNTER — Telehealth: Payer: Self-pay | Admitting: Neurology

## 2015-01-25 DIAGNOSIS — F03918 Unspecified dementia, unspecified severity, with other behavioral disturbance: Secondary | ICD-10-CM

## 2015-01-25 DIAGNOSIS — F0391 Unspecified dementia with behavioral disturbance: Secondary | ICD-10-CM

## 2015-01-25 NOTE — Telephone Encounter (Signed)
Pt's daughter called and says her mother is still very confused and is wanting to talk about medication changes. Daughter expressed to me that Dr. Jaynee Eagles texted her yesterday and was told an appt could be made for some time this week. As of right now Dr. Jaynee Eagles does not have a 30 min  Block open. Daughter is requesting a call back  [phone] [name]

## 2015-01-26 NOTE — Telephone Encounter (Signed)
Spoke to daughter. This is a very difficult situation. Patient has advanced dementia and lives with her elderly husband. Daughter tries to help long distance and helps father manage pills by color and shape because patient's husband doesn't know the medication name. She has episodes of confusion where she is combative. She was doing well when I last saw her but recently had a UTI and since then she has been worsening. She is on Seroquel 50mg  qam and 75 mg qpm. She on a very small dose of Haldol very sparingly and only in severe cases of agitation where she may be in danger or cant be redirected. I have advised them multiple times to see a geriatric specialist in psychiatry. I also highly recommend a memory unit for patient. I'm not entirely sure that husband is giving patient her medications correclty. Patient has had several falls, feel she would benefit from PT at home, a PT safety home evaluation, skilled nursing to help with medical management at home. Will request [facility].    Terrence Dupont - I placed referrals to behavioral health for patient for Dr. Adele Schilder and also referred to Dr. Casimiro Needle. Will you call and give daughter the phone numbers so she can call and schedule appointments please? Also, need to contact [facility] please.  thanks

## 2015-01-27 ENCOUNTER — Other Ambulatory Visit: Payer: Self-pay | Admitting: *Deleted

## 2015-01-27 DIAGNOSIS — F03918 Unspecified dementia, unspecified severity, with other behavioral disturbance: Secondary | ICD-10-CM

## 2015-01-27 DIAGNOSIS — R419 Unspecified symptoms and signs involving cognitive functions and awareness: Secondary | ICD-10-CM

## 2015-01-27 DIAGNOSIS — Z7409 Other reduced mobility: Secondary | ICD-10-CM

## 2015-01-27 DIAGNOSIS — Z659 Problem related to unspecified psychosocial circumstances: Secondary | ICD-10-CM

## 2015-01-27 DIAGNOSIS — Z789 Other specified health status: Secondary | ICD-10-CM

## 2015-01-27 DIAGNOSIS — W19XXXD Unspecified fall, subsequent encounter: Secondary | ICD-10-CM

## 2015-01-27 DIAGNOSIS — R269 Unspecified abnormalities of gait and mobility: Secondary | ICD-10-CM

## 2015-01-27 DIAGNOSIS — F0391 Unspecified dementia with behavioral disturbance: Secondary | ICD-10-CM

## 2015-01-27 NOTE — Telephone Encounter (Signed)
Sent message to [facility] Miguel Dibble) to let her know referral is placed for home health previously.

## 2015-01-27 NOTE — Telephone Encounter (Signed)
Called and spoke to daughter, [name]. Gave her both numbers for Dr. Casimiro Needle and Dr. Adele Schilder office to call. Also gave her phone number to [facility]. She verbalized appreciation and understanding. Told her to call with any further questions.

## 2015-02-02 ENCOUNTER — Other Ambulatory Visit: Payer: Self-pay | Admitting: Neurology

## 2015-02-02 ENCOUNTER — Telehealth: Payer: Self-pay | Admitting: Neurology

## 2015-02-02 DIAGNOSIS — M5010 Cervical disc disorder with radiculopathy, unspecified cervical region: Secondary | ICD-10-CM

## 2015-02-02 DIAGNOSIS — M47812 Spondylosis without myelopathy or radiculopathy, cervical region: Secondary | ICD-10-CM

## 2015-02-02 DIAGNOSIS — M542 Cervicalgia: Secondary | ICD-10-CM

## 2015-02-02 MED ORDER — CELECOXIB 200 MG PO CAPS: 200.0000 mg | ORAL_CAPSULE | Freq: Every day | ORAL | Status: DC

## 2015-02-02 MED ORDER — TIZANIDINE HCL 2 MG PO CAPS: 2.0000 mg | ORAL_CAPSULE | Freq: Three times a day (TID) | ORAL | Status: DC

## 2015-02-02 NOTE — Telephone Encounter (Signed)
[facility] [phone] is with patient setting up home health and there is a discrepancy regarding medication atorvastatin (LIPITOR) 10 MG tablet. Daughter has written down Lipitor 20mg  every morning.

## 2015-02-02 NOTE — Telephone Encounter (Signed)
Pt PCP to verify lipitor dose: Gilford Rile Phone: [phone]

## 2015-02-02 NOTE — Telephone Encounter (Signed)
LVM for Brooke Watson returning her call. Advised Dr. Jaynee Eagles did not prescribe lipitor. She would need to contact her PCP, Gilford Rile office at [phone]. Gave pt name and DOB. Gave [facility] phone number if she has further questions.

## 2015-02-03 ENCOUNTER — Other Ambulatory Visit: Payer: Self-pay | Admitting: Neurology

## 2015-02-03 ENCOUNTER — Telehealth: Payer: Self-pay | Admitting: Neurology

## 2015-02-03 DIAGNOSIS — M542 Cervicalgia: Secondary | ICD-10-CM

## 2015-02-03 NOTE — Telephone Encounter (Signed)
Brooke Watson with [facility] called spoke with daughter. Daughter and Brooke Watson agree that pt should have outpt PT for neck pain and modalities. please refer to [facility] [place], fax: [phone], phone: [phone] Call back # [phone]

## 2015-02-03 NOTE — Telephone Encounter (Signed)
Referral placed, thanks

## 2015-02-03 NOTE — Telephone Encounter (Signed)
LVM for Brooke Watson at [facility] to let him know referral was placed for pt as requested. Told him to call back with any further questions. Gave [facility] phone number.

## 2015-02-03 NOTE — Telephone Encounter (Signed)
Spoke w/ [name], daughter, to let her know referral was placed for physical therapy. Told her to call if she needed anything. She verbalized understanding.

## 2015-02-07 NOTE — Telephone Encounter (Signed)
Mia with [facility] called to get verbal order for OT eval for Wednesday of this week. She can be reached at [phone]

## 2015-02-07 NOTE — Telephone Encounter (Signed)
Called Brooke Watson from [facility] back to let her know OT eval is okay. She was supposed to evaluate pt last week, but pt was overwhelmed and wanted to wait on evaluation. [name] is going to go this week Wednesday. Told her to call back if she needs anything else.

## 2015-02-09 NOTE — Telephone Encounter (Signed)
Mia with [facility] called sts pt is having out pt therapy at [facility] in [place] and will not need in home therapy. Her phone number is [phone] if any questions arise

## 2015-02-09 NOTE — Telephone Encounter (Signed)
thanks

## 2015-02-15 ENCOUNTER — Encounter: Payer: Self-pay | Admitting: *Deleted

## 2015-02-15 NOTE — Progress Notes (Signed)
Faxed signed POC dated 02/08/15 back to [facility] to [phone]. Received fax confirmation. Copy sent to MR.

## 2015-02-21 ENCOUNTER — Telehealth: Payer: Self-pay | Admitting: Neurology

## 2015-02-21 MED ORDER — MEMANTINE HCL 10 MG PO TABS: 10.0000 mg | ORAL_TABLET | Freq: Two times a day (BID) | ORAL | Status: DC

## 2015-02-21 NOTE — Telephone Encounter (Signed)
Pt's daughter called and says that pts insurance will not cover the Namenda and Aricept combination pill form. Daughter did not know the exact name. But she is asking for Namenda and Aricept separately. Please call and advise [phone]

## 2015-02-21 NOTE — Telephone Encounter (Signed)
I called the pharmacy.  They stated the Namzaric is covered under the current ins plan, however, the co-pay is $160.    I called and spoke with Ms Brooke Watson.  They would like to get a Rx for Aricept and Namenda separate due to cost.  Spoke with Dr Jaynee Eagles, who kindly agreed with patient getting meds separately.  I called back and spoke with Ms Brooke Watson.  She prefers generic Namenda vs Brand name due to cost and says they already have a Rx for Aricept at the pharmacy.  Says spouse will be handing medication to avoid confusion.  Rx has been sent.  Receipt confirmed by pharmacy.  I called the pharmacy and advised this replaces Namzaric.  They expressed understanding.

## 2015-04-13 ENCOUNTER — Other Ambulatory Visit: Payer: Self-pay | Admitting: Neurology

## 2015-04-14 ENCOUNTER — Encounter: Payer: Self-pay | Admitting: Neurology

## 2015-04-14 ENCOUNTER — Telehealth: Payer: Self-pay | Admitting: *Deleted

## 2015-04-14 ENCOUNTER — Ambulatory Visit (INDEPENDENT_AMBULATORY_CARE_PROVIDER_SITE_OTHER): Payer: Medicare Other | Admitting: Neurology

## 2015-04-14 VITALS — BP 135/84 | HR 85 | Ht 68.0 in | Wt 146.8 lb

## 2015-04-14 DIAGNOSIS — M542 Cervicalgia: Secondary | ICD-10-CM

## 2015-04-14 DIAGNOSIS — F0391 Unspecified dementia with behavioral disturbance: Secondary | ICD-10-CM

## 2015-04-14 DIAGNOSIS — F03918 Unspecified dementia, unspecified severity, with other behavioral disturbance: Secondary | ICD-10-CM

## 2015-04-14 MED ORDER — QUETIAPINE FUMARATE ER 150 MG PO TB24: 150.0000 mg | ORAL_TABLET | Freq: Every day | ORAL | Status: DC

## 2015-04-14 MED ORDER — DICLOFENAC SODIUM 1 % TD GEL: 2.0000 g | Freq: Four times a day (QID) | TRANSDERMAL | Status: DC

## 2015-04-14 NOTE — Telephone Encounter (Signed)
Called pt pharmacy to cx short acting seroquel 25mg  and 50mg  per Dr Jaynee Eagles request. Damaris Schooner to [facility]. He cx. Also verified per Dr Jaynee Eagles request that 75mg  IR 2 times daily is equivalent to 150 ER of Seroquel. He verbalized it was equivalent. Dr Jaynee Eagles aware.

## 2015-04-14 NOTE — Progress Notes (Addendum)
[id number]    Provider:  Dr Jaynee Eagles Referring Provider: Raina Mina., MD Primary Care Physician:  Gilford Rile, MD  CC: Dementia with behavioral disturbance  Addendum Feb 5051: 82 year old female who is here for follow up of Dementia with behavioral disturbances and delusions. She is having delusions, wandering, getting lost, calling the police, severely agitated at times.Significant decline in the last year. She lives with her husband of 53 years and they live independently. I have highly recommended memory unit for patient but family is trying desperately to keep her at home despite recommendations. Patient was seen by Dr. Casimiro Needle, geriatric psychiatry, for her late-stage dementia with severe behavioral disturbances. He recommended Depakote 500mg  qday, Seroquel 100mg  qhs and 50mg  qam and Haldol up to 0,5mg  bid prn as needed for severe agitation. Changes made.   Addendum 05/09/2015:  Had a long discussion with daughter. Patient  has late stage dementia and is still living at home with her husband. Daughter and husband are trying desperately to keep [place] at home. I have encouraged them to place her in a memory unit. She has increasing agitation, delusions, calling the police and locking her husband out of the house, not knowing her husband. Currently she is on Seroquel 200 mg extended release with Haldol(0.25mg  twice daily prn dispensed 20 tabs monthly) only as needed for severe agitation that is not redirectable and might end in harm to the patient. I have on countless occasions discussed that these types of medications carry black box warnings and increase risk for morbidity and mortality. I have advised the family to follow up countless times with either Dr. Adele Schilder or [facility] who are geriatric psychiatrists. They do have an appointment with Dr. Casimiro Needle at the end of February. In the meantime have increased the Seroquel 200 mg extended release and when necessary Haldol only  as needed for severe agitation that is not redirectable. I informed the daughter and the husband that at this point I don't feel comfortable in increasing any more medications. She will have to follow with geriatric psychiatry. And again my recommendation is for memory unit.   Update 04/14/2015: She is getting agitated in the afternoons. She is on 75mg  Seroquel twice day IR and will change to long-acting 150mg  once daily. She is getting confused especially in the afternoons. She worries about her money, that someone is stealing her money She thinks someone is taking her shoes. She is a little agitated today, a little argumentative but redirectable. Her dementia is progressing, she is having more delusions, she is forgetting more. She has gained weight, her appetite is good. She sleeps all night, sleeps well. Occ she gets up and wanders but she is redirectable. Her neck still hurts.   Update 01/06/2015: Here for follow up. The medication is working. In the afternoon she may have a "spell" but nothing like the behavioral issues as previously. This is less confusing for father. She sundowns in the evening around 4pm but it is manageable. When he gives he a haldol it is managable. Sister helps, daughter talks to him twice a day. Her neck is a little stiff but it doesn't bother her. Patient says "I am doing very well, no problems". She is eating well and sleeping well. No more wandering. If she wants to walk, they go with her and she is fine in an hour.   She is on Seroquel 50mg  qam and 75mg qpm. Can consider changing to XR once daily version but hate to change medication regimen,  patient's husband gets confused with changes - and may be more expensive. Haldol very low dose and very sparingly - only for severe agitation when she can't be redirected and her safety is of concern.   Update 09/30/2014: MINNAH DONAWAY is a 82 y.o. female here as a follow up for Dementia with behavioral disturbance. Husband  accompanies patient and provides most of the information. They have been married for approximately 30 years and the husband is trying desperately to keep his wife at home. Recently she has become more forgetful, and often agitated and running to the police when she doesn't recognize her husband, wandering. Have been talking with patient's husband and patient's daughter extensively in recent months and recommending that patient placed in a care facility. Husband is quite earnest however he himself is having difficulty managing medications and managing patient.  Her medications were changed from Risperdal to Seroquel twice daily due to extrapyramidal symptoms. Haldol was limited to 5 tabs per month and only to be given for extreme agitation or behavior that severe and not redirectable.   She had a "spell" Monday night, she didn't know husband, couldn't reorient her. He gave her extra seroquel and haldol. This happens once a month. Patient says "I don't even know what he is talking about". No falls. She is walking twice a week with sister a half mile. BP was 167/102 in the office but they have not restarted BP medications. She has some pain in the neck, tremor and some decreased ROM in the neck. Every once in a while she has "spells" and she she gets agitated, wants to leave the house and "go home". She is sleeping well at night, no wandering, not leaving the bedroom. Walking is fine. Patient says "I feel fine" except for the "crick in my neck". Neck hurts sometimes, tightness. Heating pad helps.   Highly encouraged f/u with pcp and restarting BP medications.    History 08/30/2014: Patient was given Risperdal bid to help manage patient's delusions with prn Haldol ONLY if patient became aggressive, however husband was overdosing patient which likely contributed to falls as well extrapyramidal symptoms. Patient has had several falls recently, and today presents with neck pain and a new lesion on her right  collarbone.  Patient is getting 0.5mg  Risperdal daily in the morning for agitation. Her neck hurts today since a fall. She fell a week ago on Monday. She had CT of the neck and sacrum 2 days ago which did not show fractures. This morning husband noticed a bruise on her collar bone. Her neck is very sore. She is not having behavioral issues anymore. She can't move her neck though. No tremors or stiffness in the rest of the body. She takes benadryl every night for sleep and it doesn't help with the neck stiffness. Husband tried to stop the risperdal a few weeks ago and she started becoming agitated, wanting to go outside and walk, wanted to see her deceased mother. He restarted the Risperdal.  She appears to be having a dystonic reaction in her neck today. She has a red lesion on her right collar bone unclear if it is a new bruise or burn.   Will stop the risperdal and haldol and dispose of meds. Will start low dose Seroquel as this has fewer EPS side effects. Will start at 25mg  bid and slowly increase to 50mg  bid if needed.  Review of Systems: Patient complains of symptoms per HPI as well as the following symptoms: neck pain, no  CP, no SOB. Pertinent negatives per HPI. All others negative.   Social History   Social History  . Marital Status: Married    Spouse Name: Alveta Heimlich  . Number of Children: 1  . Years of Education: 12   Occupational History  . Retired [employer]   Social History Main Topics  . Smoking status: Former Research scientist (life sciences)  . Smokeless tobacco: Never Used  . Alcohol Use: No  . Drug Use: No  . Sexual Activity: Not on file   Other Topics Concern  . Not on file   Social History Narrative   Patient is married. Alveta Heimlich   Patient has a daughter.   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    Caffeine use: none     Family History  Problem Relation Age of Onset  . Dementia Neg Hx     Past Medical History  Diagnosis Date  . High  cholesterol   . Cognitive decline   . Dementia     Past Surgical History  Procedure Laterality Date  . Colon surgery      Current Outpatient Prescriptions  Medication Sig Dispense Refill  . amLODipine (NORVASC) 5 MG tablet Take 5 mg by mouth daily.    Marland Kitchen aspirin EC 81 MG tablet Take 1 tablet (81 mg total) by mouth daily. 30 tablet 6  . atorvastatin (LIPITOR) 10 MG tablet Take 10 mg by mouth at bedtime.  6  . celecoxib (CELEBREX) 200 MG capsule Take 1 capsule (200 mg total) by mouth daily. Take with food. Stop for GI upset or dark stools. 30 capsule 6  . donepezil (ARICEPT) 10 MG tablet Take 10 mg by mouth at bedtime.  6  . haloperidol (HALDOL) 0.5 MG tablet Take 0.5 tablets (0.25 mg total) by mouth 2 (two) times daily as needed (Only for severe agitation). Do not exceed 10 tabs monthly 10 tablet 11  . ibuprofen (ADVIL,MOTRIN) 200 MG tablet Take 400 mg by mouth every 6 (six) hours as needed.     Marland Kitchen levothyroxine (SYNTHROID, LEVOTHROID) 50 MCG tablet Take 50 mcg by mouth daily before breakfast.    . memantine (NAMENDA) 10 MG tablet Take 1 tablet (10 mg total) by mouth 2 (two) times daily. 60 tablet 3  . Memantine HCl-Donepezil HCl (NAMZARIC) 28-10 MG CP24 Take 1 capsule by mouth daily. 30 capsule 11  . tizanidine (ZANAFLEX) 2 MG capsule Take 1 capsule (2 mg total) by mouth 3 (three) times daily. Can cause confusion and sedation. 90 capsule 6  . QUEtiapine Fumarate (SEROQUEL XR) 150 MG 24 hr tablet Take 1 tablet (150 mg total) by mouth at bedtime. 30 tablet 6   No current facility-administered medications for this visit.    Allergies as of 04/14/2015 - Review Complete 04/14/2015  Allergen Reaction Noted  . Amoxicillin  10/23/2013  . Zolpidem tartrate  10/23/2013    Vitals: BP 135/84 mmHg  Pulse 85  Ht 5\' 8"  (1.727 m)  Wt 146 lb 12.8 oz (66.588 kg)  BMI 22.33 kg/m2 Last Weight:  Wt Readings from Last 1 Encounters:  04/14/15 146 lb 12.8 oz (66.588 kg)   Last Height:   Ht  Readings from Last 1 Encounters:  04/14/15 5\' 8"  (1.727 m)    Gen: NAD, more conversant, well groomedtoday, she is more alert today, hair is done CV: RRR  CV: RRR, no MRG.  Speech:  No aphasia Cognition:  The patient is oriented to person  only Cranial Nerves:  The pupils are equal, round, and reactive to light. Visual fields are full to threat. Extraocular movements are intact. Trigeminal sensation is intact and the muscles of mastication are normal. The face is symmetric. The palate elevates in the midline. Hearing intact. Voice is normal. Shoulder shrug is normal. The tongue has normal motion without fasciculations.   Motor 5/5.  Gait:  Slightly shuffling  Motor Observation:  Mild postural tremor  Tone:  normal tone in the extremities. Increased cervical muscle tone.     Assessment/Plan: 82 year old female who is here for follow up of Dementia with behavioral disturbances and delusions. She is having delusions, wandering, getting lost, calling the police, severely agitated at times.Significant decline in the last year. She lives with her husband of 52 years and they live independently. She was given Risperdal bid with haldol sparingly only as needed as she was having severe agitation that was not redirectable. Management initially worked however patient became increasingly sedated with falls as husband was not giving patient medication as prescribed and was over medicating patient. Risperdal was changed to Seroquel twice daily. Started Seroquel as this has fewer EPS side effects which she appears to be having. Changed to Seroquel once daily 200 XR for easier dosing.  Haldol was limited to 0.25mg  twice daily prn with 20 tabs max per month  and only to be given for extreme agitation or behavior that is severe and not redirectable.  Addendum Feb 2017: Patient was seen by Dr. Casimiro Needle, geriatric psychiatry, for her late-stage dementia with behavioral  disturbances. He recommended Depakote 500mg  qday, Seroquel 100mg  qhs and 50mg  qam and Haldol up to 0,5mg  bid prn as needed for severe agitation. Changes made.   She has some increased tone in the cervical area may be a dystonic reaction from the medication but it is not bothering her and there is no pain. And she is doing so well otherwise, will leave it as is and monitor.   Have been trying to encourage daughter and husband to place patient in a facility for many months now. Daughter and husband are resistant. Have also recommended going to the emergency room on many occasions. Have encouraged follow up with primary care for Bp management and evaluation of other metabolic/toxic etiologies for increased confusion.  Discussed multiple times with patient's husband and daughter that antipsychotic medications can help in dementia with behavioral disturbances however these medications carry significant risk of sedation, falls, extrapyramidal symptoms, as well as an increased risk of death in the elderly. We need to use these medications sparingly and if patient cannot be safely managed at home they need to consider placing the patient unfortunately in a facility where she could be cared for 24 x 7 safely. They acknowledged understanding of risks on multiple occassions. Also have recommended multiple times a geriatric psychiatrist ([place], [place]) and they have not pursued.  PT/OT and home safety assessment ordered last appointment as well as home nursing. discussed with daughter. Continue current meds and will keep a close eye on patient and husband.  Addendum 05/09/2015:Had a long discussion with daughter. Patient  has late stage dementia and is still living at home with her husband. Daughter and husband are trying desperately to keep [place] at home. I have encouraged them to place her in a memory unit. She has increasing agitation, delusions, calling the police and locking her husband out of the house,  not knowing her husband. Currently she is on Seroquel 200 mg extended release  with Haldol(0.25mg  twice daily prn dispensed 20 tabs monthly) only as needed for severe agitation that is not redirectable and might end in harm to the patient. I have on countless occasions discussed that these types of medications carry black box warnings and increase risk for morbidity and mortality. I have advised the family to follow up countless times with either Dr. Adele Schilder or [facility] who are geriatric psychiatrists. They do have an appointment with Dr. Casimiro Needle at the end of February. In the meantime she can continue the Seroquel 200 mg extended release and when necessary Haldol only as needed for severe agitation that is not redirectable. I informed the daughter and the husband that at this point I don't feel comfortable in increasing any more medications. She will have to follow with geriatric psychiatry. And again my recommendation is for memory unit.     Sarina Ill, MD  [facility] [address] [place], [place] [address]  Phone [phone] Fax [phone]  A total of 40 minutes was spent face-to-face with this patient. Over half this time was spent on counseling patient on the dementia diagnosis and different diagnostic and therapeutic options available.

## 2015-04-14 NOTE — Patient Instructions (Addendum)
As far as your medications are concerned, I would like to suggest: Will Change Quetiapine to 150mg  once daily  I would like to see you back in 3 months, sooner if we need to. Please call us with any interim questions, concerns, problems, updates or refill requests.   Our phone number is [phone]. We also have an after hours call service for urgent matters and there is a physician on-call for urgent questions. For any emergencies you know to call 911 or go to the nearest emergency room

## 2015-04-25 ENCOUNTER — Telehealth: Payer: Self-pay | Admitting: Neurology

## 2015-04-25 NOTE — Telephone Encounter (Signed)
Called pt pharmacy. Spoke to Antoine. Rx haldol last refilled on 04/05/15. Quantity 10 for 30 days.   Called daughter back. Pt has calmed down. [name] calmed her down over the phone. By late afternoon she gets very agitated. No one can calm her down. Pt has not complained of sx of UTI. Treated for UTI about a month per daughter. She typically gets confused when having UTI. She is not sure if she is having frequency/burning pain. She states her mother does not ever complain of sx when she has had UTI in the past.   [name] states Haldol works very well when she is very agitated. She takes half tablet of 0.5mg  tablet and sometimes needs to repeat dose. She states she goes over medications and dosing with father all the time. She mentioned that long acting Seroquel worked well and she is wondering if Dr Jaynee Eagles can switch her back to this. Advised Dr Jaynee Eagles w/ a pt and will speak to her and call her back. She verbalized understanding.

## 2015-04-25 NOTE — Telephone Encounter (Signed)
Pt's daughter called said pt is more agitated and confused for last several days-since last Friday (04/22/15). Mostly agitated, pt's husband has been having to give her more haldol- she gets 10/mth, she only has 1 more to last until Thursday because it is the only thing that calms her down. She locked her husband out of the house today. Please call [name]. Thanks

## 2015-04-26 ENCOUNTER — Other Ambulatory Visit: Payer: Self-pay | Admitting: Neurology

## 2015-04-26 ENCOUNTER — Encounter: Payer: Self-pay | Admitting: *Deleted

## 2015-04-26 DIAGNOSIS — F03918 Unspecified dementia, unspecified severity, with other behavioral disturbance: Secondary | ICD-10-CM

## 2015-04-26 DIAGNOSIS — F0391 Unspecified dementia with behavioral disturbance: Secondary | ICD-10-CM

## 2015-04-26 MED ORDER — QUETIAPINE FUMARATE ER 200 MG PO TB24: 200.0000 mg | ORAL_TABLET | Freq: Every day | ORAL | Status: DC

## 2015-04-26 NOTE — Progress Notes (Signed)
Faxed signed paperwork back to [facility] home health dated 02/02/15, 02/14/15. Received confirmation. [phone]. Sent copy to MR.

## 2015-04-26 NOTE — Telephone Encounter (Signed)
Spoke to daughter. Patient has been more agitated. More frequently.  Patient locked husband out of the house. She is more agitated. This is a very difficult situation. Patient has advanced dementia and lives with her elderly husband. Daughter tries to help long distance and helps father manage pills by color and shape because patient's husband doesn't know the medication name. She has episodes of confusion where she is combative. I have been trying to manage her medications and have been advising them multiple times to see a geriatric psychiatrist which they have not.  They need to place patient into a professional facility.    Will increase Seoquel to 200mg  qday. They give it at 4:30pm and she gets agitated by the next day at 1pm-2pm. Advised that they should try to give it in the morning instead.   She has assured me she will call Dr. Casimiro Needle or Dr. Adele Schilder, Geriatric psychiatrists, for further management.     Marland Kitchen

## 2015-04-26 NOTE — Telephone Encounter (Signed)
Daughter [name] called states she called yesterday and no one has returned her call.

## 2015-05-09 ENCOUNTER — Other Ambulatory Visit: Payer: Self-pay | Admitting: Neurology

## 2015-05-09 ENCOUNTER — Telehealth: Payer: Self-pay | Admitting: Neurology

## 2015-05-09 MED ORDER — HALOPERIDOL 0.5 MG PO TABS: 0.2500 mg | ORAL_TABLET | Freq: Two times a day (BID) | ORAL | Status: DC | PRN

## 2015-05-09 NOTE — Telephone Encounter (Signed)
Had a long discussion with daughter. Patient  has late stage dementia and is still living at home with her husband. Daughter and husband are trying desperately to keep Vol[place] at home. I have encouraged them to place her in a memory unit. She has increasing agitation, delusions, calling the police and locking her husband out of the house, not knowing her husband. Currently she is on Seroquel 200 mg extended release with Haldol only as needed for severe agitation that is not redirectable and might and in harm to the patient. I have on countless occasions discussed that these types of medications carry black box warnings and increase risk for morbidity and mortality. I have advised the family to follow up with either Dr. Adele Schilder or [facility] who are geriatric psychiatrists. They do have an appointment with Dr. Casimiro Needle at the end of February. In the meantime she can continue the Seroquel 200 mg extended release with when necessary Haldol again only as needed for severe agitation that is not redirectable. I informed the daughter and the husband that at this point I don't feel comfortable in increasing any more medications. She will have to follow with geriatric psychiatry. And again my recommendation is for memory unit.

## 2015-05-17 ENCOUNTER — Other Ambulatory Visit: Payer: Self-pay | Admitting: Neurology

## 2015-05-17 DIAGNOSIS — F29 Unspecified psychosis not due to a substance or known physiological condition: Secondary | ICD-10-CM

## 2015-05-17 MED ORDER — HALOPERIDOL 0.5 MG PO TABS: 0.5000 mg | ORAL_TABLET | Freq: Two times a day (BID) | ORAL | Status: DC | PRN

## 2015-05-17 MED ORDER — DIVALPROEX SODIUM ER 250 MG PO TB24: 500.0000 mg | ORAL_TABLET | Freq: Every day | ORAL | Status: DC

## 2015-06-02 ENCOUNTER — Other Ambulatory Visit: Payer: Self-pay | Admitting: Neurology

## 2015-06-04 ENCOUNTER — Other Ambulatory Visit: Payer: Self-pay | Admitting: Neurology

## 2015-06-04 DIAGNOSIS — F03918 Unspecified dementia, unspecified severity, with other behavioral disturbance: Secondary | ICD-10-CM

## 2015-06-04 DIAGNOSIS — F29 Unspecified psychosis not due to a substance or known physiological condition: Secondary | ICD-10-CM

## 2015-06-04 DIAGNOSIS — F0391 Unspecified dementia with behavioral disturbance: Secondary | ICD-10-CM

## 2015-06-04 MED ORDER — QUETIAPINE FUMARATE 50 MG PO TABS: 50.0000 mg | ORAL_TABLET | Freq: Every day | ORAL | Status: DC

## 2015-06-08 ENCOUNTER — Telehealth: Payer: Self-pay | Admitting: Neurology

## 2015-06-08 ENCOUNTER — Other Ambulatory Visit: Payer: Self-pay | Admitting: Neurology

## 2015-06-08 DIAGNOSIS — F03918 Unspecified dementia, unspecified severity, with other behavioral disturbance: Secondary | ICD-10-CM

## 2015-06-08 DIAGNOSIS — F0391 Unspecified dementia with behavioral disturbance: Secondary | ICD-10-CM

## 2015-06-08 MED ORDER — QUETIAPINE FUMARATE 100 MG PO TABS: 100.0000 mg | ORAL_TABLET | Freq: Every day | ORAL | Status: DC

## 2015-06-08 NOTE — Telephone Encounter (Signed)
Pt's daughter called said she saw Dr Casimiro Needle yesterday (06/07/15) and he suggested Dr Jaynee Eagles put an order in for depakote levels to be checked since it has been about 2 weeks since she started it. Also he suggested the QUEtiapine (SEROQUEL XR) 200 MG 24 hr tablet be decreased bc she is on haloperidol (HALDOL) 0.5 MG tablet and the seroquel.He suggested the 200mg  be cut in half at night but that tab is not scored and will not be as effective. Pt has a few 150mg  that could be used or a new RX for 100mg  XR. What ever Dr Jaynee Eagles thinks. She said thank you.

## 2015-06-08 NOTE — Telephone Encounter (Signed)
I ordered the 100mg  seroqel in place of the 200mg  and asked pharmacy to cancel the 200mg . Brooke Watson, would you call dr. Willette Pa office and ask him to place a depakote level for [place] so patient can have it done in [place]? We can't order a lab in [place] and its far for patient to drive down just for a lab, thanks. Thanks!

## 2015-06-09 NOTE — Telephone Encounter (Signed)
Called and spoke to [place] from Dr Bea Graff office. She stated they cannot take lab orders from providers outside of [facility]. She can take lab order to local hospital and have it done there or an independent lab such as [facility] or [facility].

## 2015-06-09 NOTE — Telephone Encounter (Signed)
Patient's daughter is calling. She wants to know if an order can be sent to [facility] in [place] since Dr. Willette Pa office would not do it. She states it is too far for the patient to come to our office to have the lab drawn. Please call patient's daughter and discuss. Thank you.

## 2015-06-09 NOTE — Telephone Encounter (Signed)
Called daughter back. Relayed message below. Advised I am going to fax order over order to [facility] in [place] once I get Dr Jaynee Eagles to sign it. Gave address: [address] [place], [place] [address]. She is going to call her father and mother and give them the address. I advised they are open 8-5pm and closed 1-2pm for lunch. She states she will have them go tomorrow. She will have her mother hold depakote dose in the morning and go get labs and then take it after getting lab work. Told her to call back if she has further questions. She verbalized understanding.

## 2015-06-09 NOTE — Telephone Encounter (Signed)
Faxed lab order to requested [facility] location. Received confirmation. Fax: [phone].

## 2015-06-12 ENCOUNTER — Other Ambulatory Visit: Payer: Self-pay | Admitting: Neurology

## 2015-06-12 ENCOUNTER — Telehealth: Payer: Self-pay | Admitting: Neurology

## 2015-06-12 NOTE — Telephone Encounter (Signed)
82 year old female with late stage Dementia with severe behavioral disturbances. She is having delusions, wandering, getting lost, calling the police, severely agitated at times.  She lives with her elderly husband of 62 years and they live independently. I have highly recommended memory unit for patient but family is trying desperately to keep her at home despite recommendations. Patient was seen by Dr. Casimiro Needle, geriatric psychiatry, for her late-stage dementia with severe behavioral disturbances. He recommended Depakote 500mg  qday, Seroquel 100mg  qhs and 50mg  qam and Haldol up to 0,5mg  bid prn as needed for severe agitation. Changes made. I have on countless occasions discussed that these types of medications carry black box warnings and increase risk for morbidity and mortality.  Daughter has been managing patient from a distance and helping her father. They increased the Depakote from 250mg  to 500mg  as prescribed and patient has been dizzy and lethargic. They have held her medications for a day with no improvement. Advised them to go to the ED Friday night for patient's symptoms. Also abrupt cessation of her medications may cause withdrawal symptoms. Today patient is too weak to stand. I spoke to daughter and encouraged her father(patient's husband) to take patient to the ED.

## 2015-06-13 ENCOUNTER — Encounter (HOSPITAL_COMMUNITY): Payer: Self-pay | Admitting: Emergency Medicine

## 2015-06-13 ENCOUNTER — Other Ambulatory Visit: Payer: Self-pay | Admitting: Neurology

## 2015-06-13 ENCOUNTER — Emergency Department (HOSPITAL_COMMUNITY): Payer: Medicare Other

## 2015-06-13 ENCOUNTER — Observation Stay (HOSPITAL_COMMUNITY)
Admission: EM | Admit: 2015-06-13 | Discharge: 2015-06-15 | Disposition: A | Discharge status: Home / Self Care | Payer: Medicare Other | Attending: Internal Medicine | Admitting: Internal Medicine

## 2015-06-13 DIAGNOSIS — R4182 Altered mental status, unspecified: Secondary | ICD-10-CM | POA: Insufficient documentation

## 2015-06-13 DIAGNOSIS — Z79899 Other long term (current) drug therapy: Secondary | ICD-10-CM | POA: Insufficient documentation

## 2015-06-13 DIAGNOSIS — Z7982 Long term (current) use of aspirin: Secondary | ICD-10-CM | POA: Diagnosis not present

## 2015-06-13 DIAGNOSIS — E079 Disorder of thyroid, unspecified: Secondary | ICD-10-CM | POA: Diagnosis not present

## 2015-06-13 DIAGNOSIS — R05 Cough: Secondary | ICD-10-CM | POA: Diagnosis not present

## 2015-06-13 DIAGNOSIS — F29 Unspecified psychosis not due to a substance or known physiological condition: Secondary | ICD-10-CM

## 2015-06-13 DIAGNOSIS — I10 Essential (primary) hypertension: Secondary | ICD-10-CM | POA: Insufficient documentation

## 2015-06-13 DIAGNOSIS — Z87891 Personal history of nicotine dependence: Secondary | ICD-10-CM | POA: Diagnosis not present

## 2015-06-13 DIAGNOSIS — E7800 Pure hypercholesterolemia, unspecified: Secondary | ICD-10-CM | POA: Diagnosis not present

## 2015-06-13 DIAGNOSIS — F0390 Unspecified dementia without behavioral disturbance: Secondary | ICD-10-CM | POA: Insufficient documentation

## 2015-06-13 DIAGNOSIS — R531 Weakness: Secondary | ICD-10-CM | POA: Diagnosis present

## 2015-06-13 DIAGNOSIS — Z880 Allergy status to penicillin: Secondary | ICD-10-CM | POA: Insufficient documentation

## 2015-06-13 DIAGNOSIS — Z87440 Personal history of urinary (tract) infections: Secondary | ICD-10-CM | POA: Diagnosis not present

## 2015-06-13 DIAGNOSIS — M1990 Unspecified osteoarthritis, unspecified site: Secondary | ICD-10-CM | POA: Insufficient documentation

## 2015-06-13 DIAGNOSIS — G9340 Encephalopathy, unspecified: Secondary | ICD-10-CM | POA: Diagnosis present

## 2015-06-13 DIAGNOSIS — F0391 Unspecified dementia with behavioral disturbance: Secondary | ICD-10-CM | POA: Diagnosis present

## 2015-06-13 DIAGNOSIS — R059 Cough, unspecified: Secondary | ICD-10-CM

## 2015-06-13 DIAGNOSIS — R4189 Other symptoms and signs involving cognitive functions and awareness: Secondary | ICD-10-CM | POA: Diagnosis present

## 2015-06-13 DIAGNOSIS — R4789 Other speech disturbances: Secondary | ICD-10-CM | POA: Diagnosis not present

## 2015-06-13 DIAGNOSIS — Z791 Long term (current) use of non-steroidal anti-inflammatories (NSAID): Secondary | ICD-10-CM | POA: Insufficient documentation

## 2015-06-13 DIAGNOSIS — F03918 Unspecified dementia, unspecified severity, with other behavioral disturbance: Secondary | ICD-10-CM | POA: Diagnosis present

## 2015-06-13 HISTORY — DX: Urinary tract infection, site not specified: N39.0

## 2015-06-13 HISTORY — DX: Disorder of thyroid, unspecified: E07.9

## 2015-06-13 HISTORY — DX: Unspecified osteoarthritis, unspecified site: M19.90

## 2015-06-13 HISTORY — DX: Essential (primary) hypertension: I10

## 2015-06-13 LAB — BASIC METABOLIC PANEL
ANION GAP: 12 (ref 5–15)
BUN: 27 mg/dL — ABNORMAL HIGH (ref 6–20)
CALCIUM: 8.8 mg/dL — AB (ref 8.9–10.3)
CO2: 22 mmol/L (ref 22–32)
CREATININE: 1.19 mg/dL — AB (ref 0.44–1.00)
Chloride: 104 mmol/L (ref 101–111)
GFR calc non Af Amer: 42 mL/min — ABNORMAL LOW (ref >60)
GFR, EST AFRICAN AMERICAN: 49 mL/min — AB (ref >60)
Glucose, Bld: 130 mg/dL — ABNORMAL HIGH (ref 65–99)
Potassium: 3.9 mmol/L (ref 3.5–5.1)
SODIUM: 138 mmol/L (ref 135–145)

## 2015-06-13 LAB — CBC
HCT: 49.8 % — ABNORMAL HIGH (ref 36.0–46.0)
HEMOGLOBIN: 16.8 g/dL — AB (ref 12.0–15.0)
MCH: 30.8 pg (ref 26.0–34.0)
MCHC: 33.7 g/dL (ref 30.0–36.0)
MCV: 91.2 fL (ref 78.0–100.0)
PLATELETS: 264 10*3/uL (ref 150–400)
RBC: 5.46 MIL/uL — AB (ref 3.87–5.11)
RDW: 13.8 % (ref 11.5–15.5)
WBC: 14.9 10*3/uL — AB (ref 4.0–10.5)

## 2015-06-13 LAB — HEPATIC FUNCTION PANEL
ALBUMIN: 3.8 g/dL (ref 3.5–5.0)
ALT: 16 U/L (ref 14–54)
AST: 29 U/L (ref 15–41)
Alkaline Phosphatase: 89 U/L (ref 38–126)
BILIRUBIN TOTAL: 0.9 mg/dL (ref 0.3–1.2)
Bilirubin, Direct: <0.1 mg/dL — ABNORMAL LOW (ref 0.1–0.5)
TOTAL PROTEIN: 7.4 g/dL (ref 6.5–8.1)

## 2015-06-13 LAB — TROPONIN I

## 2015-06-13 LAB — URINALYSIS, ROUTINE W REFLEX MICROSCOPIC
Glucose, UA: NEGATIVE mg/dL
Ketones, ur: NEGATIVE mg/dL
LEUKOCYTES UA: NEGATIVE
NITRITE: NEGATIVE
PROTEIN: NEGATIVE mg/dL
SPECIFIC GRAVITY, URINE: 1.027 (ref 1.005–1.030)
pH: 6 (ref 5.0–8.0)

## 2015-06-13 LAB — URINE MICROSCOPIC-ADD ON

## 2015-06-13 LAB — CBG MONITORING, ED: GLUCOSE-CAPILLARY: 107 mg/dL — AB (ref 65–99)

## 2015-06-13 LAB — I-STAT CG4 LACTIC ACID, ED: LACTIC ACID, VENOUS: 1.88 mmol/L (ref 0.5–2.0)

## 2015-06-13 LAB — VALPROIC ACID LEVEL

## 2015-06-13 MED ORDER — CELECOXIB 200 MG PO CAPS
200.0000 mg | ORAL_CAPSULE | Freq: Every day | ORAL | Status: DC
  Administered 2015-06-14 – 2015-06-15 (×2): 200 mg via ORAL
  Filled 2015-06-13 (×3): qty 1

## 2015-06-13 MED ORDER — HALOPERIDOL 0.5 MG PO TABS: 0.2500 mg | ORAL_TABLET | Freq: Two times a day (BID) | ORAL | Status: DC | PRN

## 2015-06-13 MED ORDER — LEVOTHYROXINE SODIUM 50 MCG PO TABS
50.0000 ug | ORAL_TABLET | Freq: Every day | ORAL | Status: DC
  Administered 2015-06-14 – 2015-06-15 (×2): 50 ug via ORAL
  Filled 2015-06-13 (×4): qty 1

## 2015-06-13 MED ORDER — SODIUM CHLORIDE 0.9 % IV BOLUS (SEPSIS)
1000.0000 mL | Freq: Once | INTRAVENOUS | Status: AC
  Administered 2015-06-13: 1000 mL via INTRAVENOUS

## 2015-06-13 MED ORDER — MEMANTINE HCL-DONEPEZIL HCL ER 28-10 MG PO CP24: 1.0000 | ORAL_CAPSULE | Freq: Every day | ORAL | Status: DC

## 2015-06-13 MED ORDER — ACETAMINOPHEN 325 MG PO TABS
650.0000 mg | ORAL_TABLET | Freq: Once | ORAL | Status: AC
  Administered 2015-06-13: 650 mg via ORAL
  Filled 2015-06-13: qty 2

## 2015-06-13 MED ORDER — QUETIAPINE FUMARATE 50 MG PO TABS: 50.0000 mg | ORAL_TABLET | Freq: Every day | ORAL | Status: DC

## 2015-06-13 MED ORDER — AMLODIPINE BESYLATE 5 MG PO TABS
5.0000 mg | ORAL_TABLET | Freq: Every day | ORAL | Status: DC
  Administered 2015-06-14 – 2015-06-15 (×2): 5 mg via ORAL
  Filled 2015-06-13 (×3): qty 1

## 2015-06-13 MED ORDER — DIVALPROEX SODIUM ER 500 MG PO TB24: 500.0000 mg | ORAL_TABLET | Freq: Every day | ORAL | Status: DC

## 2015-06-13 MED ORDER — QUETIAPINE FUMARATE 100 MG PO TABS: 100.0000 mg | ORAL_TABLET | Freq: Every day | ORAL | Status: DC

## 2015-06-13 MED ORDER — ASPIRIN EC 81 MG PO TBEC
81.0000 mg | DELAYED_RELEASE_TABLET | Freq: Every day | ORAL | Status: DC
  Administered 2015-06-14 – 2015-06-15 (×2): 81 mg via ORAL
  Filled 2015-06-13 (×3): qty 1

## 2015-06-13 MED ORDER — DIVALPROEX SODIUM ER 250 MG PO TB24
250.0000 mg | ORAL_TABLET | Freq: Every day | ORAL | Status: DC
  Administered 2015-06-15: 250 mg via ORAL
  Filled 2015-06-13 (×4): qty 1

## 2015-06-13 MED ORDER — SODIUM CHLORIDE 0.9 % IV SOLN
INTRAVENOUS | Status: DC
  Administered 2015-06-13 (×2): via INTRAVENOUS

## 2015-06-13 MED ORDER — ATORVASTATIN CALCIUM 10 MG PO TABS
10.0000 mg | ORAL_TABLET | Freq: Every day | ORAL | Status: DC
  Administered 2015-06-14: 10 mg via ORAL
  Filled 2015-06-13 (×3): qty 1

## 2015-06-13 NOTE — ED Notes (Addendum)
Family reports pt lethargic, altered orientation, weakness, and incontinence onset 06/08/15; hx of dementia; currently adjusted dementia medications. Pt normally a/o x4 but currently disoriented x4.

## 2015-06-13 NOTE — ED Provider Notes (Signed)
CSN: [id number]     Arrival date & time 06/13/15  1259 History   First MD Initiated Contact with Patient 06/13/15 1502     Chief Complaint  Patient presents with  . Altered Mental Status  . Weakness     (Consider location/radiation/quality/duration/timing/severity/associated sxs/prior Treatment) HPI Comments: Patient here with altered mental status 5 days which is been progressively worse. Recently had her Haldol, Depakote, Seroquel discontinued. No reported history of fever. No vomiting or diarrhea. No cough or congestion. Does have a history of dementia but according to daughter this is much different. Denies any recent history of falls. No cough or congestion. She has been incontinent of urine. No treatment use prior to arrival nothing makes her symptoms better or worse.  Patient is a 82 y.o. female presenting with altered mental status and weakness. The history is provided by the patient and a relative.  Altered Mental Status Associated symptoms: weakness   Weakness    Past Medical History  Diagnosis Date  . High cholesterol   . Cognitive decline   . Dementia   . Hypertension   . Thyroid disease   . UTI (urinary tract infection)   . Arthritis    Past Surgical History  Procedure Laterality Date  . Colon surgery     Family History  Problem Relation Age of Onset  . Dementia Neg Hx    Social History  Substance Use Topics  . Smoking status: Former Research scientist (life sciences)  . Smokeless tobacco: Never Used  . Alcohol Use: No   OB History    No data available     Review of Systems  Neurological: Positive for weakness.  All other systems reviewed and are negative.     Allergies  Ambien and Amoxicillin  Home Medications   Prior to Admission medications   Medication Sig Start Date End Date Taking? Authorizing Provider  amLODipine (NORVASC) 5 MG tablet Take 5 mg by mouth daily.   Yes Historical Provider, MD  aspirin EC 81 MG tablet Take 1 tablet (81 mg total) by mouth daily.  06/30/14  Yes Melvenia Beam, MD  atorvastatin (LIPITOR) 10 MG tablet Take 10 mg by mouth at bedtime. 06/30/14  Yes Historical Provider, MD  celecoxib (CELEBREX) 200 MG capsule Take 200 mg by mouth daily.   Yes Historical Provider, MD  diphenhydrAMINE (BENADRYL) 25 MG tablet Take 25-50 mg by mouth at bedtime as needed for sleep.   Yes Historical Provider, MD  divalproex (DEPAKOTE ER) 250 MG 24 hr tablet Take 2 tablets (500 mg total) by mouth daily with breakfast. Start with one pill and increase to 2 pills in 2 weeks. 05/17/15  Yes Melvenia Beam, MD  haloperidol (HALDOL) 0.5 MG tablet Take 1 tablet (0.5 mg total) by mouth 2 (two) times daily as needed (Only for severe agitation). Do not exceed 10 tabs monthly Patient taking differently: Take 0.25-0.5 mg by mouth 2 (two) times daily as needed (Only for severe agitation). Do not exceed 10 tabs monthly 05/17/15  Yes Melvenia Beam, MD  levothyroxine (SYNTHROID, LEVOTHROID) 50 MCG tablet Take 50 mcg by mouth daily before breakfast.   Yes Historical Provider, MD  Memantine HCl-Donepezil HCl (NAMZARIC) 28-10 MG CP24 Take 1 capsule by mouth daily.   Yes Historical Provider, MD  QUEtiapine (SEROQUEL XR) 200 MG 24 hr tablet Take 200 mg by mouth at bedtime.   Yes Historical Provider, MD  QUEtiapine (SEROQUEL) 50 MG tablet Take 1 tablet (50 mg total) by mouth daily with  breakfast. 06/04/15  Yes Melvenia Beam, MD  QUEtiapine Fumarate (SEROQUEL XR) 150 MG 24 hr tablet Take 150 mg by mouth at bedtime.   Yes Historical Provider, MD  diclofenac sodium (VOLTAREN) 1 % GEL Apply 2 g topically 4 (four) times daily. On neck muscles. 04/14/15   Melvenia Beam, MD  QUEtiapine (SEROQUEL) 100 MG tablet Take 1 tablet (100 mg total) by mouth at bedtime. 06/08/15   Melvenia Beam, MD   BP 114/86 mmHg  Pulse 93  Temp(Src) 99.9 F (37.7 C) (Rectal)  Resp 18  SpO2 95% Physical Exam  Constitutional: She is oriented to person, place, and time. She appears well-developed and  well-nourished.  Non-toxic appearance. No distress.  HENT:  Head: Normocephalic and atraumatic.  Eyes: Conjunctivae, EOM and lids are normal. Pupils are equal, round, and reactive to light.  Neck: Normal range of motion. Neck supple. No tracheal deviation present. No thyroid mass present.  Cardiovascular: Normal rate, regular rhythm and normal heart sounds.  Exam reveals no gallop.   No murmur heard. Pulmonary/Chest: Effort normal and breath sounds normal. No stridor. No respiratory distress. She has no decreased breath sounds. She has no wheezes. She has no rhonchi. She has no rales.  Abdominal: Soft. Normal appearance and bowel sounds are normal. She exhibits no distension. There is no tenderness. There is no rebound and no CVA tenderness.  Musculoskeletal: Normal range of motion. She exhibits no edema or tenderness.  Neurological: She is alert and oriented to person, place, and time. She displays no tremor. No cranial nerve deficit or sensory deficit. She displays no seizure activity. GCS eye subscore is 4. GCS verbal subscore is 5. GCS motor subscore is 6.  Skin: Skin is warm and dry. No abrasion and no rash noted.  Psychiatric: Her affect is blunt. Her speech is delayed. She is slowed.  Nursing note and vitals reviewed.   ED Course  Procedures (including critical care time) Labs Review Labs Reviewed  BASIC METABOLIC PANEL - Abnormal; Notable for the following:    Glucose, Bld 130 (*)    BUN 27 (*)    Creatinine, Ser 1.19 (*)    Calcium 8.8 (*)    GFR calc non Af Amer 42 (*)    GFR calc Af Amer 49 (*)    All other components within normal limits  CBC - Abnormal; Notable for the following:    WBC 14.9 (*)    RBC 5.46 (*)    Hemoglobin 16.8 (*)    HCT 49.8 (*)    All other components within normal limits  CBG MONITORING, ED - Abnormal; Notable for the following:    Glucose-Capillary 107 (*)    All other components within normal limits  URINE CULTURE  CULTURE, BLOOD (ROUTINE X  2)  CULTURE, BLOOD (ROUTINE X 2)  URINALYSIS, ROUTINE W REFLEX MICROSCOPIC (NOT AT [facility])  VALPROIC ACID LEVEL  HEPATIC FUNCTION PANEL  TROPONIN I  I-STAT CG4 LACTIC ACID, ED    Imaging Review No results found. I have personally reviewed and evaluated these images and lab results as part of my medical decision-making.   EKG Interpretation None      MDM   Final diagnoses:  None    pts labs and xrays reviewed. Pt has low grade temp and leukocytosis. Influenza sent, will be admitted by dr gardner    Lacretia Leigh, MD 06/13/15 2350

## 2015-06-13 NOTE — Telephone Encounter (Signed)
Daughter returned Emma's call, was advised per Terrence Dupont and Dr. Jaynee Eagles to take patient to ED to be evaluated. Daughter understands and agrees, will proceed to ED.

## 2015-06-13 NOTE — Telephone Encounter (Signed)
Dr Ahern- FYI 

## 2015-06-13 NOTE — Telephone Encounter (Signed)
Pt's daughter called said she would like orders for in home care [facility] [phone]- for labs and UA. Brooke Watson pt is not doing well today.

## 2015-06-13 NOTE — Telephone Encounter (Addendum)
LVM for daughter returning her call. Gave [facility] phone number. Please let her know per Dr Jaynee Eagles, she needs to bring her to ED to be evaluated. We cannot do in home lab/UA through [facility].

## 2015-06-13 NOTE — ED Notes (Signed)
Attempted to obtain urine sample was not abe to get RN made aware will try again

## 2015-06-14 ENCOUNTER — Observation Stay (HOSPITAL_COMMUNITY): Payer: Medicare Other

## 2015-06-14 ENCOUNTER — Observation Stay (HOSPITAL_COMMUNITY)
Admit: 2015-06-14 | Discharge: 2015-06-14 | Disposition: A | Discharge status: Home / Self Care | Payer: Medicare Other | Attending: Internal Medicine | Admitting: Internal Medicine

## 2015-06-14 DIAGNOSIS — F0391 Unspecified dementia with behavioral disturbance: Secondary | ICD-10-CM | POA: Diagnosis not present

## 2015-06-14 DIAGNOSIS — R4189 Other symptoms and signs involving cognitive functions and awareness: Secondary | ICD-10-CM | POA: Diagnosis not present

## 2015-06-14 DIAGNOSIS — G9340 Encephalopathy, unspecified: Secondary | ICD-10-CM

## 2015-06-14 LAB — AMMONIA: Ammonia: 26 umol/L (ref 9–35)

## 2015-06-14 LAB — INFLUENZA PANEL BY PCR (TYPE A & B)
H1N1FLUPCR: NOT DETECTED
INFLAPCR: NEGATIVE
Influenza B By PCR: NEGATIVE

## 2015-06-14 MED ORDER — DONEPEZIL HCL 10 MG PO TABS
10.0000 mg | ORAL_TABLET | Freq: Every day | ORAL | Status: DC
  Administered 2015-06-14: 10 mg via ORAL
  Filled 2015-06-14: qty 1

## 2015-06-14 MED ORDER — SODIUM CHLORIDE 0.9 % IV SOLN
INTRAVENOUS | Status: DC
  Administered 2015-06-14 (×2): via INTRAVENOUS

## 2015-06-14 MED ORDER — MEMANTINE HCL ER 28 MG PO CP24
28.0000 mg | ORAL_CAPSULE | Freq: Every day | ORAL | Status: DC
  Administered 2015-06-15: 28 mg via ORAL
  Filled 2015-06-14 (×3): qty 1

## 2015-06-14 MED ORDER — HALOPERIDOL LACTATE 5 MG/ML IJ SOLN
1.0000 mg | Freq: Once | INTRAMUSCULAR | Status: AC
  Administered 2015-06-14: 1 mg via INTRAVENOUS

## 2015-06-14 MED ORDER — ENOXAPARIN SODIUM 40 MG/0.4ML ~~LOC~~ SOLN
40.0000 mg | SUBCUTANEOUS | Status: DC
  Administered 2015-06-14 – 2015-06-15 (×2): 40 mg via SUBCUTANEOUS
  Filled 2015-06-14 (×3): qty 0.4

## 2015-06-14 MED ORDER — HALOPERIDOL LACTATE 5 MG/ML IJ SOLN
5.0000 mg | Freq: Once | INTRAMUSCULAR | Status: DC
  Filled 2015-06-14: qty 1

## 2015-06-14 NOTE — Progress Notes (Signed)
EEG completed; results pending.    

## 2015-06-14 NOTE — Progress Notes (Signed)
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 Patient be improved since yesterday.  Assessment/Plan: Principal Problem:   Acute encephalopathy Active Problems:   Cognitive decline   Dementia with behavioral disturbance   This is a no charge note, patient seen and examined, and data base reviewed. Patient seen earlier today by my colleague Dr. Fabio Neighbors, appears to be admitted for somnolence. Recent change in medications, per her daughter they were not able to switch the medication on the right time. The psychiatrist recommended to cut Seroquel in half and reported to Haldol as needed to her still given both along with Depakote. Hold medications for today, restart tomorrow with Seroquel XL 100 mg, Depakote 250 twice a day and Haldol as needed, avoid Benadryl.  Code Status: Full Code Family Communication: Plan discussed with the patient. Disposition Plan: Remains inpatient Diet: Diet regular Room service appropriate?: Yes; Fluid consistency:: Thin  Consultants:  None  Procedures:  None  Antibiotics:  None (indicate start date, and stop date if known)   Objective: Filed Vitals:   06/14/15 0600 06/14/15 1023  BP: 129/82 138/82  Pulse: 73 81  Temp:  98.7 F (37.1 C)  Resp: 13 18   No intake or output data in the 24 hours ending 06/14/15 1238 Filed Weights   06/14/15 1023  Weight: 66.8 kg (147 lb 4.3 oz)    Exam: General: Alert and awake, oriented x1 only to self HEENT: anicteric sclera, pupils reactive to light and accommodation, EOMI CVS: S1-S2 clear, no murmur rubs or gallops Chest: clear to auscultation bilaterally, no wheezing, rales or rhonchi Abdomen: soft nontender, nondistended, normal bowel sounds, no organomegaly Extremities: no cyanosis, clubbing or edema noted bilaterally Neuro: Cranial nerves II-XII  intact, no focal neurological deficits  Data Reviewed: Basic Metabolic Panel:  Recent Labs Lab 06/13/15 1356  NA 138  K 3.9  CL 104  CO2 22  GLUCOSE 130*  BUN 27*  CREATININE 1.19*  CALCIUM 8.8*   Liver Function Tests:  Recent Labs Lab 06/13/15 1407  AST 29  ALT 16  ALKPHOS 89  BILITOT 0.9  PROT 7.4  ALBUMIN 3.8   No results for input(s): LIPASE, AMYLASE in the last 168 hours.  Recent Labs Lab 06/14/15 0023  AMMONIA 26   CBC:  Recent Labs Lab 06/13/15 1356  WBC 14.9*  HGB 16.8*  HCT 49.8*  MCV 91.2  PLT 264   Cardiac Enzymes:  Recent Labs Lab 06/13/15 1407  TROPONINI <0.03   BNP (last 3 results) No results for input(s): BNP in the last 8760 hours.  ProBNP (last 3 results) No results for input(s): PROBNP in the last 8760 hours.  CBG:  Recent Labs Lab 06/13/15 1322  GLUCAP 107*    Micro No results found for this or any previous visit (from the past 240 hour(s)).   Studies: Dg Chest 2 View  06/13/2015  CLINICAL DATA:  Cough EXAM: CHEST  2 VIEW COMPARISON:  08/10/2013 FINDINGS: The heart size and mediastinal contours are within normal limits. Both lungs are clear. The visualized skeletal structures are unremarkable. IMPRESSION: No active cardiopulmonary disease. Electronically Signed   By: Franchot Gallo M.D.   On: 06/13/2015 18:44   Ct Head Wo Contrast  06/13/2015  CLINICAL DATA:  Lethargy and weakness for several days EXAM: CT HEAD WITHOUT CONTRAST TECHNIQUE: Contiguous axial images were obtained from the base of the skull  through the vertex without intravenous contrast. COMPARISON:  09/13/2014 FINDINGS: Bony calvarium is intact. Diffuse atrophic changes and chronic white matter ischemic change is seen. No findings to suggest acute hemorrhage, acute infarction or space-occupying mass lesion are noted. IMPRESSION: Chronic atrophic and ischemic changes without acute abnormality. Electronically Signed   By: Inez Catalina M.D.   On: 06/13/2015 16:05     Mr Brain Wo Contrast  06/14/2015  CLINICAL DATA:  82 year old female with acute encephalopathy: Lethargy, weakness, worsening mental status. Current history of dementia. Initial encounter. EXAM: MRI HEAD WITHOUT CONTRAST TECHNIQUE: Multiplanar, multiecho pulse sequences of the brain and surrounding structures were obtained without intravenous contrast. COMPARISON:  Head CT without contrast 06/13/2015. Brain MRI 03/01/2014 and earlier. FINDINGS: Major intracranial vascular flow voids are stable. Cerebral volume is not significantly changed since 2015. No restricted diffusion to suggest acute infarction. No midline shift, mass effect, evidence of mass lesion, ventriculomegaly, extra-axial collection or acute intracranial hemorrhage. Cervicomedullary junction and pituitary are within normal limits. Stable visualized cervical spine. Stable gray and white matter signal throughout the brain. Patchy and confluent mostly periventricular cerebral white matter T2 and FLAIR hyperintensity. Chronic confluent subcortical white matter signal abnormality in the left parietal lobe with facilitated diffusion (series 6, image 19) stable since 2014. No new cortical encephalomalacia. Fairly numerous punctate peripheral low signal foci on T2 * imaging could reflect scattered chronic micro hemorrhages or vessel artifact. Deep gray matter nuclei, brainstem, and cerebellum remain within normal limits. Visible internal auditory structures appear normal. Mastoids and paranasal sinuses remain clear. Stable postoperative changes to the globes. Negative scalp soft tissues. Normal bone marrow signal. IMPRESSION: No acute intracranial abnormality. Stable noncontrast MRI appearance of the brain since 2015, with chronic cerebral signal changes which are nonspecific but favored due to small vessel disease. Electronically Signed   By: Genevie Ann M.D.   On: 06/14/2015 07:15    Scheduled Meds: . amLODipine  5 mg Oral Daily  . aspirin EC  81 mg  Oral Daily  . atorvastatin  10 mg Oral QHS  . celecoxib  200 mg Oral Daily  . divalproex  250 mg Oral Q breakfast  . donepezil  10 mg Oral QHS  . enoxaparin (LOVENOX) injection  40 mg Subcutaneous Q24H  . levothyroxine  50 mcg Oral QAC breakfast  . memantine  28 mg Oral Daily   Continuous Infusions: . sodium chloride Stopped (06/14/15 0841)  . sodium chloride 100 mL/hr at 06/14/15 1058       Time spent: 35 minutes    [facility]  [facility] Pager [phone] If 7PM-7AM, please contact night-coverage at [email or web address], password [facility] 06/14/2015, 12:38 PM

## 2015-06-14 NOTE — H&P (Addendum)
[facility] History and Physical  Brooke Watson [id number] DOB: 09-Sep-1933 DOA: 06/13/2015  Referring physician: EDP PCP: Gilford Rile, MD   Chief Complaint: AMS   HPI: Brooke Watson is a 82 y.o. female with h/o late-stage dementia with severe behavioral disturbances.  Patient presents to the ED with 1 week history of worsening confusion, lethargy, and AMS.  Patient had been started on depakote a couple of weeks ago by Dr. Casimiro Needle, who then planned on weaning down her seroquel.  She was doing great up until 1 week ago, when she started developing symptoms.  Daughter (who is an NP) took patient off of seroquel 6 days ago, and depakote yesterday due to worsening symptoms.  Symptoms have continued to worsen.  Has had runny nose, but no cough, no congestion, no obvious fever or chills or other localizing complaints.  Review of Systems: unable to perform due to mental status.  Past Medical History  Diagnosis Date  . High cholesterol   . Cognitive decline   . Dementia   . Hypertension   . Thyroid disease   . UTI (urinary tract infection)   . Arthritis    Past Surgical History  Procedure Laterality Date  . Colon surgery     Social History:  reports that she has quit smoking. She has never used smokeless tobacco. She reports that she does not drink alcohol or use illicit drugs.  Allergies  Allergen Reactions  . Ambien [Zolpidem Tartrate]     Disorientation   . Amoxicillin     Unknown.  Has patient had a PCN reaction causing immediate rash, facial/tongue/throat swelling, SOB or lightheadedness with hypotension: unknown Has patient had a PCN reaction causing severe rash involving mucus membranes or skin necrosis: unknown Has patient had a PCN reaction that required hospitalization No Has patient had a PCN reaction occurring within the last 10 years: unknown If all of the above answers are "NO", then may proceed with Cephalosporin use.     Family History  Problem  Relation Age of Onset  . Dementia Neg Hx      Prior to Admission medications   Medication Sig Start Date End Date Taking? Authorizing Provider  amLODipine (NORVASC) 5 MG tablet Take 5 mg by mouth daily.   Yes Historical Provider, MD  aspirin EC 81 MG tablet Take 1 tablet (81 mg total) by mouth daily. 06/30/14  Yes Melvenia Beam, MD  atorvastatin (LIPITOR) 10 MG tablet Take 10 mg by mouth at bedtime. 06/30/14  Yes Historical Provider, MD  celecoxib (CELEBREX) 200 MG capsule Take 200 mg by mouth daily.   Yes Historical Provider, MD  diphenhydrAMINE (BENADRYL) 25 MG tablet Take 25-50 mg by mouth at bedtime as needed for sleep.   Yes Historical Provider, MD  divalproex (DEPAKOTE ER) 250 MG 24 hr tablet Take 2 tablets (500 mg total) by mouth daily with breakfast. Start with one pill and increase to 2 pills in 2 weeks. 05/17/15  Yes Melvenia Beam, MD  haloperidol (HALDOL) 0.5 MG tablet Take 1 tablet (0.5 mg total) by mouth 2 (two) times daily as needed (Only for severe agitation). Do not exceed 10 tabs monthly Patient taking differently: Take 0.25-0.5 mg by mouth 2 (two) times daily as needed (Only for severe agitation). Do not exceed 10 tabs monthly 05/17/15  Yes Melvenia Beam, MD  levothyroxine (SYNTHROID, LEVOTHROID) 50 MCG tablet Take 50 mcg by mouth daily before breakfast.   Yes Historical Provider, MD  Memantine HCl-Donepezil HCl [facility])  28-10 MG CP24 Take 1 capsule by mouth daily.   Yes Historical Provider, MD  QUEtiapine (SEROQUEL) 50 MG tablet Take 1 tablet (50 mg total) by mouth daily with breakfast. 06/04/15  Yes Melvenia Beam, MD  diclofenac sodium (VOLTAREN) 1 % GEL Apply 2 g topically 4 (four) times daily. On neck muscles. 04/14/15   Melvenia Beam, MD  QUEtiapine (SEROQUEL) 100 MG tablet Take 1 tablet (100 mg total) by mouth at bedtime. 06/08/15   Melvenia Beam, MD   Physical Exam: Filed Vitals:   06/13/15 1745 06/13/15 2303  BP: 146/103 134/98  Pulse: 95 79  Temp:    Resp: 16  18    BP 134/98 mmHg  Pulse 79  Temp(Src) 99.9 F (37.7 C) (Rectal)  Resp 18  SpO2 97%  General Appearance:    Slowed mentation, demented, disoriented x 4 no distress, appears stated age  Head:    Normocephalic, atraumatic  Eyes:    PERRL, EOMI, sclera non-icteric        Nose:   Nares without drainage or epistaxis. Mucosa, turbinates normal  Throat:   Moist mucous membranes. Oropharynx without erythema or exudate.  Neck:   Supple. No carotid bruits.  No thyromegaly.  No lymphadenopathy.   Back:     No CVA tenderness, no spinal tenderness  Lungs:     Clear to auscultation bilaterally, without wheezes, rhonchi or rales  Chest wall:    No tenderness to palpitation  Heart:    Regular rate and rhythm without murmurs, gallops, rubs  Abdomen:     Soft, non-tender, nondistended, normal bowel sounds, no organomegaly  Genitalia:    deferred  Rectal:    deferred  Extremities:   No clubbing, cyanosis or edema.  Pulses:   2+ and symmetric all extremities  Skin:   Skin color, texture, turgor normal, no rashes or lesions  Lymph nodes:   Cervical, supraclavicular, and axillary nodes normal  Neurologic:   Lethargic, demented, grossly non-focal, no muscle rigidity or increased motor tone.    Labs on Admission:  Basic Metabolic Panel:  Recent Labs Lab 06/13/15 1356  NA 138  K 3.9  CL 104  CO2 22  GLUCOSE 130*  BUN 27*  CREATININE 1.19*  CALCIUM 8.8*   Liver Function Tests:  Recent Labs Lab 06/13/15 1407  AST 29  ALT 16  ALKPHOS 89  BILITOT 0.9  PROT 7.4  ALBUMIN 3.8   No results for input(s): LIPASE, AMYLASE in the last 168 hours. No results for input(s): AMMONIA in the last 168 hours. CBC:  Recent Labs Lab 06/13/15 1356  WBC 14.9*  HGB 16.8*  HCT 49.8*  MCV 91.2  PLT 264   Cardiac Enzymes:  Recent Labs Lab 06/13/15 1407  TROPONINI <0.03    BNP (last 3 results) No results for input(s): PROBNP in the last 8760 hours. CBG:  Recent Labs Lab 06/13/15 1322   GLUCAP 107*    Radiological Exams on Admission: Dg Chest 2 View  06/13/2015  CLINICAL DATA:  Cough EXAM: CHEST  2 VIEW COMPARISON:  08/10/2013 FINDINGS: The heart size and mediastinal contours are within normal limits. Both lungs are clear. The visualized skeletal structures are unremarkable. IMPRESSION: No active cardiopulmonary disease. Electronically Signed   By: Franchot Gallo M.D.   On: 06/13/2015 18:44   Ct Head Wo Contrast  06/13/2015  CLINICAL DATA:  Lethargy and weakness for several days EXAM: CT HEAD WITHOUT CONTRAST TECHNIQUE: Contiguous axial images were obtained from  the base of the skull through the vertex without intravenous contrast. COMPARISON:  09/13/2014 FINDINGS: Bony calvarium is intact. Diffuse atrophic changes and chronic white matter ischemic change is seen. No findings to suggest acute hemorrhage, acute infarction or space-occupying mass lesion are noted. IMPRESSION: Chronic atrophic and ischemic changes without acute abnormality. Electronically Signed   By: Inez Catalina M.D.   On: 06/13/2015 16:05    EKG: Independently reviewed.  Assessment/Plan Principal Problem:   Acute encephalopathy Active Problems:   Cognitive decline   Dementia with behavioral disturbance   1. Acute encephalopathy -  1. Spoke with Dr. Leonel Ramsay over phone 2. Resuming depakote (level was negative) 3. Will continue to hold seroquel for now 4. Checking ammonia level as depakote can cause a hyperammonemia encephalopathy (in which case would hold depakote and treat). 5. MRI brain 6. EEG 7. IVF 8. No evidence of UTI or pneumonia 9. Will admit for observation.    Code Status: Full for now but daughter not at all opposed to having discussion with patients husband about DNR status Family Communication: Daughter at bedside, but husband is POA Disposition Plan: Admit to obs   Time spent: 28 min  GARDNER, JARED M. [facility] Pager [phone]  If 7AM-7PM, please contact the day  team taking care of the patient [email or web address] Password [email or web address] 06/14/2015, 12:04 AM

## 2015-06-14 NOTE — Procedures (Signed)
ELECTROENCEPHALOGRAM REPORT  Patient: Brooke Watson       Room #: [id number] EEG No. ID: [id number] Age: 82 y.o.        Sex: female Referring Physician: Lajean Manes Report Date:  06/14/2015        Interpreting Physician: Anthony Sar  History: Brooke Watson is an 82 y.o. female with history of dementia admitted after 5 days of progressive worsening of confusion, etiology of which is unclear.  Indications for study:  Assess severity of encephalopathy; rule out seizure activity.  Technique: This is an 18 channel routine scalp EEG performed at the bedside with bipolar and monopolar montages arranged in accordance to the international 10/20 system of electrode placement.   Description: This EEG recording was performed during wakefulness. Patient was noted to be confused at time of study. Background activity consisted of mixed moderate amplitude diffuse irregular delta and theta activity as well as frequent and somewhat periodic appearing generalized triphasic sharp waves. Photic stimulation was not performed. No epileptiform discharges were recorded.  Interpretation: This EEG is abnormal with moderately severe generalized nonspecific continuous slowing of cerebral activity, which can be seen with a wide variety of encephalopathic conditions, including metabolic and toxic encephalopathies, as well as with degenerative EMS disorders. Significance of triphasic waves abnormal ammonia level and hepatic function is unclear. No evidence of an epileptic disorder was demonstrated.   Rush Farmer M.D. [facility] [phone]

## 2015-06-14 NOTE — ED Notes (Signed)
Patient transported to MRI 

## 2015-06-15 DIAGNOSIS — F0391 Unspecified dementia with behavioral disturbance: Secondary | ICD-10-CM | POA: Diagnosis not present

## 2015-06-15 DIAGNOSIS — E876 Hypokalemia: Secondary | ICD-10-CM

## 2015-06-15 DIAGNOSIS — E860 Dehydration: Secondary | ICD-10-CM | POA: Diagnosis not present

## 2015-06-15 DIAGNOSIS — N179 Acute kidney failure, unspecified: Secondary | ICD-10-CM | POA: Diagnosis not present

## 2015-06-15 LAB — CBC
HEMATOCRIT: 35.9 % — AB (ref 36.0–46.0)
HEMOGLOBIN: 11.9 g/dL — AB (ref 12.0–15.0)
MCH: 30 pg (ref 26.0–34.0)
MCHC: 33.1 g/dL (ref 30.0–36.0)
MCV: 90.4 fL (ref 78.0–100.0)
Platelets: 212 10*3/uL (ref 150–400)
RBC: 3.97 MIL/uL (ref 3.87–5.11)
RDW: 13.4 % (ref 11.5–15.5)
WBC: 7.4 10*3/uL (ref 4.0–10.5)

## 2015-06-15 LAB — BASIC METABOLIC PANEL
ANION GAP: 6 (ref 5–15)
BUN: 14 mg/dL (ref 6–20)
CO2: 24 mmol/L (ref 22–32)
Calcium: 7.9 mg/dL — ABNORMAL LOW (ref 8.9–10.3)
Chloride: 111 mmol/L (ref 101–111)
Creatinine, Ser: 0.65 mg/dL (ref 0.44–1.00)
GFR calc Af Amer: >60 mL/min (ref >60)
GLUCOSE: 102 mg/dL — AB (ref 65–99)
POTASSIUM: 3 mmol/L — AB (ref 3.5–5.1)
Sodium: 141 mmol/L (ref 135–145)

## 2015-06-15 LAB — URINE CULTURE: Culture: NO GROWTH

## 2015-06-15 LAB — TSH: TSH: 4.361 u[IU]/mL (ref 0.350–4.500)

## 2015-06-15 MED ORDER — POTASSIUM CHLORIDE CRYS ER 20 MEQ PO TBCR
40.0000 meq | EXTENDED_RELEASE_TABLET | Freq: Once | ORAL | Status: AC
  Administered 2015-06-15: 40 meq via ORAL
  Filled 2015-06-15: qty 2

## 2015-06-15 MED ORDER — DIVALPROEX SODIUM ER 250 MG PO TB24: 250.0000 mg | ORAL_TABLET | Freq: Every day | ORAL | Status: DC

## 2015-06-15 NOTE — Discharge Summary (Signed)
Physician Discharge Summary  Brooke Watson  [id number]  DOB: 09-Sep-1933  DOA: 06/13/2015  PCP: Gilford Rile, MD  Admit date: 06/13/2015 Discharge date: 06/15/2015  Time spent: Greater than 30 minutes  Recommendations for Outpatient Follow-up:  1. Dr. Gilford Rile, PCP in one week with repeat labs (CBC & BMP). Please follow final blood culture results that were sent from the hospital. 2. Dr. Illene Silver, Psychiatry in one day. Post hospital discharge follow-up regarding adjustment of medications. 3. Home health PT, OT, RN, aide, social worker, 3 n 1 and rolling walker   Discharge Diagnoses:  Principal Problem:   Acute encephalopathy Active Problems:   Cognitive decline   Dementia with behavioral disturbance   Discharge Condition: Improved & Stable  Diet recommendation: Heart healthy diet.  Filed Weights   06/14/15 1023  Weight: 66.8 kg (147 lb 4.3 oz)    History of present illness:  Advanced dementia with behavioral disturbances - Presented with worsening mental status changes. - Workup for medical causes for mental status changes, thus far negative. - CT and MRI brain: No acute findings. EEG: No epileptiform activity. - Infectious workup negative. Blood cultures 2: Negative, urine culture times one: Negative, chest x-ray: Negative - Ammonia level: Normal. - TSH: 4.361/normal. Flu panel PCR: Negative - Continue Depakote, Aricept, Namenda.  - Left message for patient's geriatric psychiatrist Dr. Casimiro Needle but haven't heard back yet - Mental status changes improved and as per family, almost back to baseline. - Discussed with patient's daughter this afternoon. She states that they would prefer to take patient home this afternoon and decline SNF as discussed earlier. At this time, we'll discharge patient on following doses of Depakote, Aricept, Namenda and when necessary Haldol for severe agitation. Continue to hold Seroquel and DC Benadryl. Daughter will try and arrange early  follow-up with Dr. Casimiro Needle.  Hypokalemia - Replaced prior to discharge.  Dehydration - Improved. DC IV fluids.  Mild acute kidney injury - Resolved.  Anemia - Likely dilutional. Follow CBC as outpatient.  Essential hypertension - Controlled on amlodipine  Hyperlipidemia - Continue statins.   DVT prophylaxis: Lovenox Code Status: Full Family Communication: Discussed extensively with patient's daughter Ms. Tina at bedside, who is a NP in [place]. Also discussed with patient's spouse at bedside.   Consultants:  Neurology  Procedures:  EEG 06/14/15: Interpretation: This EEG is abnormal with moderately severe generalized nonspecific continuous slowing of cerebral activity, which can be seen with a wide variety of encephalopathic conditions, including metabolic and toxic encephalopathies, as well as with degenerative EMS disorders. Significance of triphasic waves abnormal ammonia level and hepatic function is unclear. No evidence of an epileptic disorder was demonstrated.  Antimicrobials:  None   Discharge Exam:  Complaints: Pleasantly confused. Overnight events noted-some agitation. Patient denies complaints this morning. As per daughter at bedside, patient has significantly improved compared to admission and mental status almost at baseline. Urinating frequently secondary to IV fluids.  Filed Vitals:   06/14/15 1500 06/14/15 2058 06/15/15 0534 06/15/15 1518  BP: 140/80 139/84 148/88 153/92  Pulse: 88 80 75 91  Temp: 99.1 F (37.3 C) 98.4 F (36.9 C) 98.7 F (37.1 C) 98.4 F (36.9 C)  TempSrc: Oral Oral Oral Oral  Resp: 18 18 18 18   Height:      Weight:      SpO2: 100% 99% 99% 98%    General exam: Pleasant elderly female sitting up comfortably in bed. Respiratory system: Clear. No increased work of breathing. Cardiovascular system: S1 & S2 heard,  RRR. No JVD, murmurs, gallops, clicks or pedal edema. Gastrointestinal system: Abdomen is nondistended, soft and  nontender. Normal bowel sounds heard. Central nervous system: Alert and oriented to self and partly to place. No focal neurological deficits. Extremities: Symmetric 5 x 5 power.  Discharge Instructions      Discharge Instructions    Call MD for:  difficulty breathing, headache or visual disturbances    Complete by:  As directed      Call MD for:  extreme fatigue    Complete by:  As directed      Call MD for:  hives    Complete by:  As directed      Call MD for:  persistant dizziness or light-headedness    Complete by:  As directed      Call MD for:  persistant nausea and vomiting    Complete by:  As directed      Call MD for:  severe uncontrolled pain    Complete by:  As directed      Call MD for:  temperature >100.4    Complete by:  As directed      Call MD for:    Complete by:  As directed   Worsening confusion.     Diet - low sodium heart healthy    Complete by:  As directed      Increase activity slowly    Complete by:  As directed             Medication List    STOP taking these medications        diphenhydrAMINE 25 MG tablet  Commonly known as:  BENADRYL     QUEtiapine 100 MG tablet  Commonly known as:  SEROQUEL     QUEtiapine 50 MG tablet  Commonly known as:  SEROQUEL      TAKE these medications        amLODipine 5 MG tablet  Commonly known as:  NORVASC  Take 5 mg by mouth daily.     aspirin EC 81 MG tablet  Take 1 tablet (81 mg total) by mouth daily.     atorvastatin 10 MG tablet  Commonly known as:  LIPITOR  Take 10 mg by mouth at bedtime.     celecoxib 200 MG capsule  Commonly known as:  CELEBREX  Take 200 mg by mouth daily.     diclofenac sodium 1 % Gel  Commonly known as:  VOLTAREN  Apply 2 g topically 4 (four) times daily. On neck muscles.     divalproex 250 MG 24 hr tablet  Commonly known as:  DEPAKOTE ER  Take 1 tablet (250 mg total) by mouth daily with breakfast.     haloperidol 0.5 MG tablet  Commonly known as:  HALDOL  Take  1 tablet (0.5 mg total) by mouth 2 (two) times daily as needed (Only for severe agitation). Do not exceed 10 tabs monthly     levothyroxine 50 MCG tablet  Commonly known as:  SYNTHROID, LEVOTHROID  Take 50 mcg by mouth daily before breakfast.     NAMZARIC 28-10 MG Cp24  Generic drug:  Memantine HCl-Donepezil HCl  Take 1 capsule by mouth daily.       Follow-up Information    Follow up with GRISSO,GREG, MD. Schedule an appointment as soon as possible for a visit in 1 week.   Specialty:  Internal Medicine   Why:  To be seen with repeat labs (CBC & BMP).   Contact  information:   [address] [place] [place] [address] [phone]       Follow up with PLOVSKY, Charlestine Night, MD. Schedule an appointment as soon as possible for a visit in 1 day.   Specialty:  Psychiatry   Contact information:   [address] [place] [address] [phone]       Follow up with [facility].   Contact information:   [phone]      Get Medicines reviewed and adjusted: Please take all your medications with you for your next visit with your Primary MD  Please request your Primary MD to go over all hospital tests and procedure/radiological results at the follow up. Please ask your Primary MD to get all Hospital records sent to his/her office.  If you experience worsening of your admission symptoms, develop shortness of breath, life threatening emergency, suicidal or homicidal thoughts you must seek medical attention immediately by calling 911 or calling your MD immediately if symptoms less severe.  You must read complete instructions/literature along with all the possible adverse reactions/side effects for all the Medicines you take and that have been prescribed to you. Take any new Medicines after you have completely understood and accept all the possible adverse reactions/side effects.   Do not drive when taking pain medications.   Do not take more than prescribed Pain,  Sleep and Anxiety Medications  Special Instructions: If you have smoked or chewed Tobacco in the last 2 yrs please stop smoking, stop any regular Alcohol and or any Recreational drug use.  Wear Seat belts while driving.  Please note  You were cared for by a hospitalist during your hospital stay. Once you are discharged, your primary care physician will handle any further medical issues. Please note that NO REFILLS for any discharge medications will be authorized once you are discharged, as it is imperative that you return to your primary care physician (or establish a relationship with a primary care physician if you do not have one) for your aftercare needs so that they can reassess your need for medications and monitor your lab values.    The results of significant diagnostics from this hospitalization (including imaging, microbiology, ancillary and laboratory) are listed below for reference.    Significant Diagnostic Studies: Dg Chest 2 View  06/13/2015  CLINICAL DATA:  Cough EXAM: CHEST  2 VIEW COMPARISON:  08/10/2013 FINDINGS: The heart size and mediastinal contours are within normal limits. Both lungs are clear. The visualized skeletal structures are unremarkable. IMPRESSION: No active cardiopulmonary disease. Electronically Signed   By: Franchot Gallo M.D.   On: 06/13/2015 18:44   Ct Head Wo Contrast  06/13/2015  CLINICAL DATA:  Lethargy and weakness for several days EXAM: CT HEAD WITHOUT CONTRAST TECHNIQUE: Contiguous axial images were obtained from the base of the skull through the vertex without intravenous contrast. COMPARISON:  09/13/2014 FINDINGS: Bony calvarium is intact. Diffuse atrophic changes and chronic white matter ischemic change is seen. No findings to suggest acute hemorrhage, acute infarction or space-occupying mass lesion are noted. IMPRESSION: Chronic atrophic and ischemic changes without acute abnormality. Electronically Signed   By: Inez Catalina M.D.   On: 06/13/2015  16:05   Mr Brain Wo Contrast  06/14/2015  CLINICAL DATA:  82 year old female with acute encephalopathy: Lethargy, weakness, worsening mental status. Current history of dementia. Initial encounter. EXAM: MRI HEAD WITHOUT CONTRAST TECHNIQUE: Multiplanar, multiecho pulse sequences of the brain and surrounding structures were obtained without intravenous contrast. COMPARISON:  Head CT without contrast 06/13/2015. Brain MRI 03/01/2014 and earlier. FINDINGS: Major intracranial vascular flow voids are stable. Cerebral volume is not significantly changed since 2015. No restricted diffusion to suggest acute infarction. No midline shift, mass effect, evidence of mass lesion, ventriculomegaly, extra-axial collection or acute intracranial hemorrhage. Cervicomedullary junction and pituitary are within normal limits. Stable visualized cervical spine. Stable gray and white matter signal throughout the brain. Patchy and confluent mostly periventricular cerebral white matter T2 and FLAIR hyperintensity. Chronic confluent subcortical white matter signal abnormality in the left parietal lobe with facilitated diffusion (series 6, image 19) stable since 2014. No new cortical encephalomalacia. Fairly numerous punctate peripheral low signal foci on T2 * imaging could reflect scattered chronic micro hemorrhages or vessel artifact. Deep gray matter nuclei, brainstem, and cerebellum remain within normal limits. Visible internal auditory structures appear normal. Mastoids and paranasal sinuses remain clear. Stable postoperative changes to the globes. Negative scalp soft tissues. Normal bone marrow signal. IMPRESSION: No acute intracranial abnormality. Stable noncontrast MRI appearance of the brain since 2015, with chronic cerebral signal changes which are nonspecific but favored due to small vessel disease. Electronically Signed   By: Genevie Ann M.D.   On: 06/14/2015 07:15    Microbiology: Recent Results (from the past 240 hour(s))   Culture, blood (Routine X 2) w Reflex to ID Panel     Status: None (Preliminary result)   Collection Time: 06/13/15  3:40 PM  Result Value Ref Range Status   Specimen Description BLOOD RIGHT FOREARM  Final   Special Requests   Final    BOTTLES DRAWN AEROBIC AND ANAEROBIC 5CC BOTH BOTTLES   Culture   Final    NO GROWTH 2 DAYS Performed at [facility]    Report Status PENDING  Incomplete  Culture, blood (Routine X 2) w Reflex to ID Panel     Status: None (Preliminary result)   Collection Time: 06/13/15  3:49 PM  Result Value Ref Range Status   Specimen Description BLOOD LEFT ARM  Final   Special Requests   Final    BOTTLES DRAWN AEROBIC AND ANAEROBIC 5CC BOTH BOTTLES   Culture   Final    NO GROWTH 2 DAYS Performed at [facility]    Report Status PENDING  Incomplete  Urine culture     Status: None   Collection Time: 06/13/15 11:03 PM  Result Value Ref Range Status   Specimen Description URINE, CLEAN CATCH  Final   Special Requests NONE  Final   Culture   Final    NO GROWTH 1 DAY Performed at [facility]    Report Status 06/15/2015 FINAL  Final     Labs: Basic Metabolic Panel:  Recent Labs Lab 06/13/15 1356 06/15/15 0330  NA 138 141  K 3.9 3.0*  CL 104 111  CO2 22 24  GLUCOSE 130* 102*  BUN 27* 14  CREATININE 1.19* 0.65  CALCIUM 8.8* 7.9*   Liver Function Tests:  Recent Labs Lab 06/13/15 1407  AST 29  ALT 16  ALKPHOS 89  BILITOT 0.9  PROT 7.4  ALBUMIN 3.8   No results for input(s): LIPASE, AMYLASE in the last 168 hours.  Recent Labs Lab 06/14/15 0023  AMMONIA 26   CBC:  Recent Labs Lab 06/13/15 1356 06/15/15 0330  WBC 14.9* 7.4  HGB 16.8* 11.9*  HCT 49.8* 35.9*  MCV 91.2 90.4  PLT 264 212   Cardiac Enzymes:  Recent Labs Lab 06/13/15 1407  TROPONINI <0.03  BNP: BNP (last 3 results) No results for input(s): BNP in the last 8760 hours.  ProBNP (last 3 results) No results for input(s): PROBNP in the last  8760 hours.  CBG:  Recent Labs Lab 06/13/15 1322  GLUCAP 107*       Signed:  HONGALGI,ANAND, MD, FACP, FHM. [facility] Pager [phone]  If 7PM-7AM, please contact night-coverage [email or web address] Password [email or web address] 06/15/2015, 4:31 PM

## 2015-06-15 NOTE — Progress Notes (Signed)
Nursing Note: Order received.Pt given haldol 1 mg IV.Pt in bed and is calmer than earlier.wbb

## 2015-06-15 NOTE — Care Management Obs Status (Signed)
[place] NOTIFICATION   Patient Details  Name: Brooke Watson MRN: [id number] Date of Birth: 01-Mar-1934   Medicare Observation Status Notification Given:  Yes    Lynnell Catalan, RN 06/15/2015, 3:49 PM

## 2015-06-15 NOTE — Progress Notes (Signed)
CSW received a consult for SNF placement.   CSW met with the Pt, spouse, and daughter at the bedside with the Pt's CM.   CSW and CM discussed Pt/family options for rehabilitation. Pt's family stated they would feel more comfortable taking the Pt home with the support of 24 nursing and HHPT.   CSW provided SNF listing for both [facility] and [facility].   CSW explained the admission process from home and encouraged the family to take this opportunity and look at some of the facilities that accepted the Pt for treatment. Pt's daughter stated that she would like to visit some of the facilities prior to returning to [place]. Pt did not participate in the d/c planning due to her dementia.   No further needs at this time.   CSW is signing off.   Pete Pelt MSW, [facility]  [phone]

## 2015-06-15 NOTE — Progress Notes (Signed)
Nursing Note: Pt 's bed alarm sounded.Entered room and pt agitated,argueing with her daughter stating that she is leaving.Pt getting up and trying to leave.Attempts to re-orient, only made pt  more agitated and angry.Pt did not recognize her daughter and was insisting on leaving.Daughter who is in the medical field,states that this is the way the pt is at home and this is why she is on seroquel,depakote and namenda /these meds currently on hold .A: Paged on-call for orders as pt is agitated and is becoming more agitated.

## 2015-06-15 NOTE — Progress Notes (Signed)
PROGRESS NOTE    Brooke Watson  [id number]  DOB: 1933-10-28  DOA: 06/13/2015 PCP: Gilford Rile, MD Outpatient Specialists: Psychiatry: Dr. [facility] course: 82 y.o. female with h/o late-stage dementia with severe behavioral disturbances, HTN, HLD and hypothyroid, lives with spouse, follows with outpatient neurology (Dr. Lavell Anchors) presented to the ED with 1 week history of worsening confusion, lethargy, and AMS.Patient had been referred to OP GeriPsych/Dr. Plovsky who started her on depakote a couple of weeks ago, who then planned on weaning down her seroquel.Her outpatient medications were being titrated (may increase Depakote from 250 MG daily to 250 MG twice a day, changed Haldol from PRN's to 0.5 MG twice a day and Seroquel decreased to 150 MG at bedtime. She then started becoming weak, lethargic and somnolent. Patient's daughter Ms. [name] who is a NP in [place], had patient hold all psychotropic medications and patient was then brought to the ED for further evaluation. Clinically improved. Physical therapy recommends SNF-spouse and family had initially declined but now agreeable.  Assessment & Plan:   Advanced dementia with behavioral disturbances - Presented with worsening mental status changes. - Workup for medical causes for mental status changes, thus far negative. - CT and MRI brain: No acute findings. EEG: No epileptiform activity. - Infectious workup negative. Blood cultures 2: Negative, urine culture times one: Negative, chest x-ray: Negative - Ammonia level: Normal. - TSH: 4.361/normal. Flu panel PCR: Negative - Continue Depakote, Aricept, Namenda. Discuss with Dr. Elisabeth Cara regarding resumption of other medications i.e. Haldol when necessary and Seroquel. - Mental status changes improved and as per family, almost back to baseline.  Hypokalemia - Replace and follow.  Dehydration - Improved. DC IV fluids.  Mild acute kidney injury - Resolved.  Anemia -  Likely dilutional. Follow CBC in a.m.  Essential hypertension - Controlled on amlodipine  Hyperlipidemia - Continue statins.   DVT prophylaxis: Lovenox Code Status: Full Family Communication: Discussed extensively with patient's daughter Ms. Tina at bedside, who is a NP in [place]. Also discussed with patient's spouse at bedside. Disposition Plan: DC to SNF when bed available.   Consultants:  Neurology  Procedures:  EEG 06/14/15: Interpretation: This EEG is abnormal with moderately severe generalized nonspecific continuous slowing of cerebral activity, which can be seen with a wide variety of encephalopathic conditions, including metabolic and toxic encephalopathies, as well as with degenerative EMS disorders. Significance of triphasic waves abnormal ammonia level and hepatic function is unclear. No evidence of an epileptic disorder was demonstrated.  Antimicrobials:  None   Subjective: Pleasantly confused. Overnight events noted-some agitation. Patient denies complaints this morning. As per daughter at bedside, patient has significantly improved compared to admission and mental status almost at baseline. Urinating frequently secondary to IV fluids.  Objective: Filed Vitals:   06/14/15 1023 06/14/15 1500 06/14/15 2058 06/15/15 0534  BP: 138/82 140/80 139/84 148/88  Pulse: 81 88 80 75  Temp: 98.7 F (37.1 C) 99.1 F (37.3 C) 98.4 F (36.9 C) 98.7 F (37.1 C)  TempSrc: Oral Oral Oral Oral  Resp: 18 18 18 18   Height: 5\' 7"  (1.702 m)     Weight: 66.8 kg (147 lb 4.3 oz)     SpO2: 100% 100% 99% 99%   No intake or output data in the 24 hours ending 06/15/15 1350 Filed Weights   06/14/15 1023  Weight: 66.8 kg (147 lb 4.3 oz)    Exam:  General exam: Pleasant elderly female sitting up comfortably in bed. Respiratory system: Clear. No  increased work of breathing. Cardiovascular system: S1 & S2 heard, RRR. No JVD, murmurs, gallops, clicks or pedal edema. Gastrointestinal  system: Abdomen is nondistended, soft and nontender. Normal bowel sounds heard. Central nervous system: Alert and oriented to self and partly to place. No focal neurological deficits. Extremities: Symmetric 5 x 5 power.   Data Reviewed: Basic Metabolic Panel:  Recent Labs Lab 06/13/15 1356 06/15/15 0330  NA 138 141  K 3.9 3.0*  CL 104 111  CO2 22 24  GLUCOSE 130* 102*  BUN 27* 14  CREATININE 1.19* 0.65  CALCIUM 8.8* 7.9*   Liver Function Tests:  Recent Labs Lab 06/13/15 1407  AST 29  ALT 16  ALKPHOS 89  BILITOT 0.9  PROT 7.4  ALBUMIN 3.8   No results for input(s): LIPASE, AMYLASE in the last 168 hours.  Recent Labs Lab 06/14/15 0023  AMMONIA 26   CBC:  Recent Labs Lab 06/13/15 1356 06/15/15 0330  WBC 14.9* 7.4  HGB 16.8* 11.9*  HCT 49.8* 35.9*  MCV 91.2 90.4  PLT 264 212   Cardiac Enzymes:  Recent Labs Lab 06/13/15 1407  TROPONINI <0.03   BNP (last 3 results) No results for input(s): PROBNP in the last 8760 hours. CBG:  Recent Labs Lab 06/13/15 1322  GLUCAP 107*    Recent Results (from the past 240 hour(s))  Culture, blood (Routine X 2) w Reflex to ID Panel     Status: None (Preliminary result)   Collection Time: 06/13/15  3:40 PM  Result Value Ref Range Status   Specimen Description BLOOD RIGHT FOREARM  Final   Special Requests   Final    BOTTLES DRAWN AEROBIC AND ANAEROBIC 5CC BOTH BOTTLES   Culture   Final    NO GROWTH < 24 HOURS Performed at [facility]    Report Status PENDING  Incomplete  Culture, blood (Routine X 2) w Reflex to ID Panel     Status: None (Preliminary result)   Collection Time: 06/13/15  3:49 PM  Result Value Ref Range Status   Specimen Description BLOOD LEFT ARM  Final   Special Requests   Final    BOTTLES DRAWN AEROBIC AND ANAEROBIC 5CC BOTH BOTTLES   Culture   Final    NO GROWTH < 24 HOURS Performed at [facility]    Report Status PENDING  Incomplete  Urine culture     Status: None    Collection Time: 06/13/15 11:03 PM  Result Value Ref Range Status   Specimen Description URINE, CLEAN CATCH  Final   Special Requests NONE  Final   Culture   Final    NO GROWTH 1 DAY Performed at [facility]    Report Status 06/15/2015 FINAL  Final         Studies: Dg Chest 2 View  06/13/2015  CLINICAL DATA:  Cough EXAM: CHEST  2 VIEW COMPARISON:  08/10/2013 FINDINGS: The heart size and mediastinal contours are within normal limits. Both lungs are clear. The visualized skeletal structures are unremarkable. IMPRESSION: No active cardiopulmonary disease. Electronically Signed   By: Franchot Gallo M.D.   On: 06/13/2015 18:44   Ct Head Wo Contrast  06/13/2015  CLINICAL DATA:  Lethargy and weakness for several days EXAM: CT HEAD WITHOUT CONTRAST TECHNIQUE: Contiguous axial images were obtained from the base of the skull through the vertex without intravenous contrast. COMPARISON:  09/13/2014 FINDINGS: Bony calvarium is intact. Diffuse atrophic changes and chronic white matter ischemic change is seen.  No findings to suggest acute hemorrhage, acute infarction or space-occupying mass lesion are noted. IMPRESSION: Chronic atrophic and ischemic changes without acute abnormality. Electronically Signed   By: Inez Catalina M.D.   On: 06/13/2015 16:05   Mr Brain Wo Contrast  06/14/2015  CLINICAL DATA:  82 year old female with acute encephalopathy: Lethargy, weakness, worsening mental status. Current history of dementia. Initial encounter. EXAM: MRI HEAD WITHOUT CONTRAST TECHNIQUE: Multiplanar, multiecho pulse sequences of the brain and surrounding structures were obtained without intravenous contrast. COMPARISON:  Head CT without contrast 06/13/2015. Brain MRI 03/01/2014 and earlier. FINDINGS: Major intracranial vascular flow voids are stable. Cerebral volume is not significantly changed since 2015. No restricted diffusion to suggest acute infarction. No midline shift, mass effect, evidence of mass  lesion, ventriculomegaly, extra-axial collection or acute intracranial hemorrhage. Cervicomedullary junction and pituitary are within normal limits. Stable visualized cervical spine. Stable gray and white matter signal throughout the brain. Patchy and confluent mostly periventricular cerebral white matter T2 and FLAIR hyperintensity. Chronic confluent subcortical white matter signal abnormality in the left parietal lobe with facilitated diffusion (series 6, image 19) stable since 2014. No new cortical encephalomalacia. Fairly numerous punctate peripheral low signal foci on T2 * imaging could reflect scattered chronic micro hemorrhages or vessel artifact. Deep gray matter nuclei, brainstem, and cerebellum remain within normal limits. Visible internal auditory structures appear normal. Mastoids and paranasal sinuses remain clear. Stable postoperative changes to the globes. Negative scalp soft tissues. Normal bone marrow signal. IMPRESSION: No acute intracranial abnormality. Stable noncontrast MRI appearance of the brain since 2015, with chronic cerebral signal changes which are nonspecific but favored due to small vessel disease. Electronically Signed   By: Genevie Ann M.D.   On: 06/14/2015 07:15        Scheduled Meds: . amLODipine  5 mg Oral Daily  . aspirin EC  81 mg Oral Daily  . atorvastatin  10 mg Oral QHS  . celecoxib  200 mg Oral Daily  . divalproex  250 mg Oral Q breakfast  . donepezil  10 mg Oral QHS  . enoxaparin (LOVENOX) injection  40 mg Subcutaneous Q24H  . levothyroxine  50 mcg Oral QAC breakfast  . memantine  28 mg Oral Daily   Continuous Infusions: . sodium chloride 100 mL/hr at 06/14/15 1058    Principal Problem:   Acute encephalopathy Active Problems:   Cognitive decline   Dementia with behavioral disturbance    Time spent: 25 minutes.    Vernell Leep, MD, FACP, FHM. [facility] Pager [phone]  If 7PM-7AM, please contact  night-coverage [email or web address] Password [email or web address] 06/15/2015, 1:50 PM

## 2015-06-15 NOTE — NC FL2 (Signed)
[place] LEVEL OF CARE SCREENING TOOL     IDENTIFICATION  Patient Name: Brooke Watson Birthdate: 1933/09/05 Sex: female Admission Date (Current Location): 06/13/2015  [facility] and [place] Number:  [place] and Address:  [facility],  [place] [address] [place]      Provider Number: [id number]  Attending Physician Name and Address:  Modena Jansky, MD  Relative Name and Phone Number:       Current Level of Care: Hospital Recommended Level of Care: [place] Prior Approval Number:    Date Approved/Denied:   PASRR Number: [id number]  Discharge Plan: SNF    Current Diagnoses: Patient Active Problem List   Diagnosis Date Noted  . Acute encephalopathy 06/13/2015  . Aggressive behavior 09/05/2014  . Dementia with behavioral disturbance 07/01/2014  . Delusions ([place]) 07/01/2014  . RLS (restless legs syndrome) 02/16/2014  . Cognitive decline 11/02/2013    Orientation RESPIRATION BLADDER Height & Weight     Self, Place  Normal Continent Weight: 147 lb 4.3 oz (66.8 kg) Height:  5\' 7"  (170.2 cm)  BEHAVIORAL SYMPTOMS/MOOD NEUROLOGICAL BOWEL NUTRITION STATUS   (no behaviors)  (NONE) Continent Diet (Diet Regular)  AMBULATORY STATUS COMMUNICATION OF NEEDS Skin   Limited Assist Verbally Normal                       Personal Care Assistance Level of Assistance  Bathing, Feeding, Dressing Bathing Assistance: Limited assistance Feeding assistance: Independent Dressing Assistance: Limited assistance     Functional Limitations Info  Sight, Hearing, Speech Sight Info: Impaired (wears eyeglasses) Hearing Info: Adequate Speech Info: Adequate    SPECIAL CARE FACTORS FREQUENCY  PT (By licensed PT), OT (By licensed OT)     PT Frequency: 5 x a week OT Frequency: 5 x a week            Contractures Contractures Info: Not present    Additional Factors Info  Code Status, Allergies Code Status Info:  FULL code status Allergies Info: Ambien, Amoxicillin           Current Medications (06/15/2015):  This is the current hospital active medication list Current Facility-Administered Medications  Medication Dose Route Frequency Provider Last Rate Last Dose  . 0.9 %  sodium chloride infusion   Intravenous Continuous Etta Quill, DO 100 mL/hr at 06/14/15 1058    . amLODipine (NORVASC) tablet 5 mg  5 mg Oral Daily Etta Quill, DO   5 mg at 06/15/15 1008  . aspirin EC tablet 81 mg  81 mg Oral Daily Etta Quill, DO   81 mg at 06/15/15 1009  . atorvastatin (LIPITOR) tablet 10 mg  10 mg Oral QHS Etta Quill, DO   10 mg at 06/14/15 2243  . celecoxib (CELEBREX) capsule 200 mg  200 mg Oral Daily Etta Quill, DO   200 mg at 06/15/15 1009  . divalproex (DEPAKOTE ER) 24 hr tablet 250 mg  250 mg Oral Q breakfast Etta Quill, DO   250 mg at 06/15/15 1009  . donepezil (ARICEPT) tablet 10 mg  10 mg Oral QHS Etta Quill, DO   10 mg at 06/14/15 2242  . enoxaparin (LOVENOX) injection 40 mg  40 mg Subcutaneous Q24H Etta Quill, DO   40 mg at 06/15/15 1010  . levothyroxine (SYNTHROID, LEVOTHROID) tablet 50 mcg  50 mcg Oral QAC breakfast Etta Quill, DO   50 mcg  at 06/15/15 1010  . memantine (NAMENDA XR) 24 hr capsule 28 mg  28 mg Oral Daily Etta Quill, DO   28 mg at 06/15/15 1010     Discharge Medications: Please see discharge summary for a list of discharge medications.  Relevant Imaging Results:  Relevant Lab Results:   Additional Information SSN: [id number]  KIDD, Hughes Better A, LCSW

## 2015-06-15 NOTE — Evaluation (Signed)
Physical Therapy Evaluation Patient Details Name: Brooke Watson MRN: [id number] DOB: 12/06/1933 Today's Date: 06/15/2015   History of Present Illness  Brooke Watson is a 82 y.o. female with h/o late-stage dementia with severe behavioral disturbances. Patient presents to the ED 06/13/15 with 1 week history of worsening confusion, lethargy, and AMS.   Clinical Impression  The patient is pleasant, presents with significant festinating gait that daughter reports is worse than earlier ambulation(< 30 " ago-reports taking Depacote recently). The patient's spouse is primary caregiver. Patient is currently requiring 24/7 very close assistance due to fall risk. Family interested in  Pursuing  SNF if remains at mod/max assist level. Pt admitted with above diagnosis. Pt currently with functional limitations due to the deficits listed below (see PT Problem List).  Pt will benefit from skilled PT to increase their independence and safety with mobility to allow discharge to the venue listed below.       Follow Up Recommendations SNF;Supervision/Assistance - 24 hour    Equipment Recommendations  Rolling walker with 5" wheels    Recommendations for Other Services       Precautions / Restrictions Precautions Precautions: Fall Precaution Comments: festinating gait      Mobility  Bed Mobility               General bed mobility comments: in recliner  Transfers Overall transfer level: Needs assistance Equipment used: 2 person hand held assist Transfers: Sit to/from Stand Sit to Stand: Mod assist         General transfer comment: multimodal cues for standing , and turning to recliner. Patient stopped at another chair. frequently stops activity and styands.  Ambulation/Gait Ambulation/Gait assistance: Mod assist;Max assist;+2 safety/equipment Ambulation Distance (Feet): 50 Feet Assistive device: 1 person hand held assist Gait Pattern/deviations: Step-to pattern;Festinating      General Gait Details: significant festinating gait, only 2 times did she take regular step length. Patient is high risk to fall. Daughter and spouse present. Daughter reports that the patient had dose of Depakote and her gait is much worse than a few minutes ago when she ambulated  with patient in hall.   Stairs            Wheelchair Mobility    Modified Rankin (Stroke Patients Only)       Balance Overall balance assessment: Needs assistance         Standing balance support: During functional activity;Bilateral upper extremity supported Standing balance-Leahy Scale: Zero                               Pertinent Vitals/Pain Pain Assessment: Faces Faces Pain Scale: No hurt    Home Living Family/patient expects to be discharged to:: Private residence Living Arrangements: Spouse/significant other;Children;Other relatives Available Help at Discharge: Family;Available 24 hours/day Type of Home: House       Home Layout: One level Home Equipment: Walker - 4 wheels Additional Comments: spouse  available 24/7 but patient is risk for fall.    Prior Function Level of Independence: Needs assistance   Gait / Transfers Assistance Needed: was ambulatory to bathroom without assist.  ADL's / Homemaking Assistance Needed: sister assisted in shower        Hand Dominance        Extremity/Trunk Assessment   Upper Extremity Assessment: Generalized weakness           Lower Extremity Assessment: RLE deficits/detail;LLE deficits/detail RLE Deficits / Details:  somewhat rigid to active movements LLE Deficits / Details: same as Right.   Cervical / Trunk Assessment: Kyphotic  Communication   Communication: Receptive difficulties;Expressive difficulties (stated" I am just tired")  Cognition Arousal/Alertness: Awake/alert Behavior During Therapy: Flat affect Overall Cognitive Status: Impaired/Different from baseline Area of Impairment: Following  commands;Awareness       Following Commands: Follows one step commands inconsistently       General Comments: per family, patient is less readily following commands    General Comments      Exercises        Assessment/Plan    PT Assessment Patient needs continued PT services  PT Diagnosis Abnormality of gait;Altered mental status   PT Problem List Decreased activity tolerance;Decreased balance;Decreased mobility;Decreased cognition;Decreased knowledge of precautions;Decreased safety awareness;Decreased knowledge of use of DME  PT Treatment Interventions DME instruction;Gait training;Functional mobility training;Therapeutic activities;Therapeutic exercise;Patient/family education   PT Goals (Current goals can be found in the Care Plan section) Acute Rehab PT Goals Patient Stated Goal: to go home PT Goal Formulation: With patient/family Time For Goal Achievement: 06/29/15 Potential to Achieve Goals: Good    Frequency Min 3X/week   Barriers to discharge Decreased caregiver support daughter returning to [place], will have spouse 24/7 but but will  need close 24/7. sisters will come in intermittently    Co-evaluation               End of Session Equipment Utilized During Treatment: Gait belt Activity Tolerance: Patient limited by fatigue Patient left: in chair;with call bell/phone within reach;with chair alarm set;with family/visitor present Nurse Communication: Mobility status    Functional Assessment Tool Used: clinical judgemnet Functional Limitation: Mobility: Walking and moving around Mobility: Walking and Moving Around Current Status [phone]): At least 60 percent but less than 80 percent impaired, limited or restricted Mobility: Walking and Moving Around Goal Status [phone]): At least 1 percent but less than 20 percent impaired, limited or restricted    Time: 1135-1202 PT Time Calculation (min) (ACUTE ONLY): 27 min   Charges:   PT Evaluation $PT Eval Low  Complexity: 1 Procedure PT Treatments $Gait Training: 8-22 mins   PT G Codes:   PT G-Codes **NOT FOR INPATIENT CLASS** Functional Assessment Tool Used: clinical judgemnet Functional Limitation: Mobility: Walking and moving around Mobility: Walking and Moving Around Current Status [id number]): At least 60 percent but less than 80 percent impaired, limited or restricted Mobility: Walking and Moving Around Goal Status [phone]): At least 1 percent but less than 20 percent impaired, limited or restricted    Claretha Cooper 06/15/2015, 2:14 PM Tresa Endo PT [phone]

## 2015-06-15 NOTE — Progress Notes (Signed)
[facility]  Delivered rw and commode to hospital room   Linward Headland 06/15/2015, 4:10 PM

## 2015-06-15 NOTE — Progress Notes (Signed)
Pt discharged home with spouse and daughter in stable condition.  Discharge instructions given. Family verbalized understanding.

## 2015-06-15 NOTE — Progress Notes (Signed)
Nursing Note: Pt asleep.arouses easily to take meds then back to sleep.Daughter at the bedside spending the night.wbb

## 2015-06-15 NOTE — Care Management Note (Signed)
Case Management Note  Patient Details  Name: Brooke Watson MRN: [id number] Date of Birth: 03/01/1934  Subjective/Objective:     82 yo admitted with Acute encephalopathy. Hx of Dementia               Action/Plan: From home with husband  Expected Discharge Date:   (unknown)               Expected Discharge Plan:  [place]  In-House Referral:  Clinical Social Work  Discharge planning Services  CM Consult  Post Acute Care Choice:  Home Health Choice offered to:  Adult Children, Spouse  DME Arranged:  3-N-1, Walker rolling DME Agency:  [facility] Arranged:  RN, PT, OT, Nurse's Aide, Social Work [employer] Agency:  [facility] ([facility])  Status of Service:  In process, will continue to follow  Medicare Important Message Given:    Date Medicare IM Given:    Medicare IM give by:    Date Additional Medicare IM Given:    Additional Medicare Important Message give by:     If discussed at [place] of Stay Meetings, dates discussed:    Additional Comments: PT recommendations were for SNF, but husband and daughter would like pt to go home with [facility] services.  Husband plans to hire private duty care to help her at home. Private Duty Caregiver list provided.  When offered choice for [facility] services husband chose [facility].  [facility] rep contacted for referral.  3 in 1 and rolling walker orders placed.  [place] DME rep contacted for referral.   Lynnell Catalan, RN 06/15/2015, 3:41 PM [phone]

## 2015-06-15 NOTE — Discharge Instructions (Signed)

## 2015-06-16 ENCOUNTER — Telehealth: Payer: Self-pay | Admitting: Neurology

## 2015-06-16 NOTE — Telephone Encounter (Signed)
Dr Jaynee Eagles- FYI  Called and spoke to daughter, [name]. Advised per Dr Jaynee Eagles that she is ok with her taking seroquel but she wants her to call Dr Plovsky's office first and get the okay from them too. She verbalized understanding. She states pt doing very well. She has gotten up and had a shower, able to go to the bathroom independently. She is a little tired. She is experiencing incontinence at nightx1. This occurred in the hospital as well. Advised I will relay message to Dr Jaynee Eagles and she can call with any further questions.

## 2015-06-16 NOTE — Progress Notes (Signed)
I had left a message for Dr. Casimiro Needle on 3/8 prior to patient's discharge. He called back today. I have updated him regarding patient's care in the hospital and that she was discharged home yesterday. Patient's family was advised to contact his office for early follow-up appointment. He agreed with treatment plan and follow-up.  Vernell Leep, MD, FACP, FHM. [facility] Pager [phone]  If 7PM-7AM, please contact night-coverage [email or web address] Password [facility] 06/16/2015, 3:43 PM

## 2015-06-16 NOTE — Telephone Encounter (Signed)
Pt's daughter called said she was home from the hospital yesterday evening 06/15/15, dx with dehydration. She is asking if pt should take seroquel 100mg  at night. She said pt is doing well, toiletry is going well and home care is coming tomorrow. Daughter will be with her thru Saturday 06/18/15. Please call or text her when you have time, she knows you are busy with pt's.

## 2015-06-18 LAB — CULTURE, BLOOD (ROUTINE X 2)
Culture: NO GROWTH
Culture: NO GROWTH

## 2015-06-30 ENCOUNTER — Encounter: Payer: Self-pay | Admitting: *Deleted

## 2015-06-30 NOTE — Progress Notes (Signed)
Faxed signed order from Dr Jaynee Eagles back to [place] for shower chair. Fax: [phone]. Received confirmation. Sent copy to medical records.

## 2015-07-14 ENCOUNTER — Ambulatory Visit (INDEPENDENT_AMBULATORY_CARE_PROVIDER_SITE_OTHER): Payer: Medicare Other | Admitting: Neurology

## 2015-07-14 ENCOUNTER — Encounter: Payer: Self-pay | Admitting: Neurology

## 2015-07-14 VITALS — BP 116/80 | HR 94 | Wt 146.8 lb

## 2015-07-14 DIAGNOSIS — Z79899 Other long term (current) drug therapy: Secondary | ICD-10-CM

## 2015-07-14 NOTE — Progress Notes (Addendum)
[id number]    Provider:  Dr Jaynee Eagles Referring Provider: Raina Mina., MD Primary Care Physician:  Gilford Rile, MD  CC: Dementia with behavioral disturbance  Interval history 07/14/2015: Patient was recently hospitalized and diagnosed with acute encephalopathy. She was diagnosed with acute kidney injury and improved on fluids. CT and MRI of the brain and EEG found no etiology blood cultures 2 were negative, urine cultures negative, chest x-ray negative, ammonia level normal, TSH normal. She was continued on Depakote, Aricept and Namenda. Dr. Amalia Greenhouse was contacted for medical management. Seroquel was held and eventually restarted. They're asked to discontinue Benadryl. She is getting physical therapy at home. Husband doesn't know what pills she is getting will call daughter. In the afternoon, she wants to "go home" and she gets confused. He will give her haldol only if she gets agitated and she can't be redirected. She says she "I think i am doing well for a 72-something women". She is gaining weight. Husband is happy, feels ;ike everything is doing better. She is drinking more water. She is taking Depakote 250mg  qam, seroquel 100mg  qpm and possibly an additional 50mg  seroquel if needed.   Addendum Feb 4935: 82 year old female who is here for follow up of Dementia with behavioral disturbances and delusions. She is having delusions, wandering, getting lost, calling the police, severely agitated at times.Significant decline in the last year. She lives with her husband of 44 years and they live independently. I have highly recommended memory unit for patient but family is trying desperately to keep her at home despite recommendations. Patient was seen by Dr. Casimiro Needle, geriatric psychiatry, for her late-stage dementia with severe behavioral disturbances. He recommended Depakote 500mg  qday, Seroquel 100mg  qhs and 50mg  qam and Haldol up to 0,5mg  bid prn as needed for severe agitation.  Changes made.   Addendum 05/09/2015: Had a long discussion with daughter. Patient has late stage dementia and is still living at home with her husband. Daughter and husband are trying desperately to keep [name] at home. I have encouraged them to place her in a memory unit. She has increasing agitation, delusions, calling the police and locking her husband out of the house, not knowing her husband. Currently she is on Seroquel 200 mg extended release with Haldol(0.25mg  twice daily prn dispensed 20 tabs monthly) only as needed for severe agitation that is not redirectable and might end in harm to the patient. I have on countless occasions discussed that these types of medications carry black box warnings and increase risk for morbidity and mortality. I have advised the family to follow up countless times with either Dr. Adele Schilder or [facility] who are geriatric psychiatrists. They do have an appointment with Dr. Casimiro Needle at the end of February. In the meantime have increased the Seroquel 200 mg extended release and when necessary Haldol only as needed for severe agitation that is not redirectable. I informed the daughter and the husband that at this point I don't feel comfortable in increasing any more medications. She will have to follow with geriatric psychiatry. And again my recommendation is for memory unit.   Update 04/14/2015: She is getting agitated in the afternoons. She is on 75mg  Seroquel twice day IR and will change to long-acting 150mg  once daily. She is getting confused especially in the afternoons. She worries about her money, that someone is stealing her money She thinks someone is taking her shoes. She is a little agitated today, a little argumentative but redirectable. Her dementia is progressing, she  is having more delusions, she is forgetting more. She has gained weight, her appetite is good. She sleeps all night, sleeps well. Occ she gets up and wanders but she is redirectable. Her neck still hurts.    Update 01/06/2015: Here for follow up. The medication is working. In the afternoon she may have a "spell" but nothing like the behavioral issues as previously. This is less confusing for father. She sundowns in the evening around 4pm but it is manageable. When he gives he a haldol it is managable. Sister helps, daughter talks to him twice a day. Her neck is a little stiff but it doesn't bother her. Patient says "I am doing very well, no problems". She is eating well and sleeping well. No more wandering. If she wants to walk, they go with her and she is fine in an hour.   She is on Seroquel 50mg  qam and 75mg qpm. Can consider changing to XR once daily version but hate to change medication regimen, patient's husband gets confused with changes - and may be more expensive. Haldol very low dose and very sparingly - only for severe agitation when she can't be redirected and her safety is of concern.   Update 09/30/2014: Brooke Watson is a 82 y.o. female here as a follow up for Dementia with behavioral disturbance. Husband accompanies patient and provides most of the information. They have been married for approximately 7 years and the husband is trying desperately to keep his wife at home. Recently she has become more forgetful, and often agitated and running to the police when she doesn't recognize her husband, wandering. Have been talking with patient's husband and patient's daughter extensively in recent months and recommending that patient placed in a care facility. Husband is quite earnest however he himself is having difficulty managing medications and managing patient.  Her medications were changed from Risperdal to Seroquel twice daily due to extrapyramidal symptoms. Haldol was limited to 5 tabs per month and only to be given for extreme agitation or behavior that severe and not redirectable.   She had a "spell" Monday night, she didn't know husband, couldn't reorient her. He gave her extra  seroquel and haldol. This happens once a month. Patient says "I don't even know what he is talking about". No falls. She is walking twice a week with sister a half mile. BP was 167/102 in the office but they have not restarted BP medications. She has some pain in the neck, tremor and some decreased ROM in the neck. Every once in a while she has "spells" and she she gets agitated, wants to leave the house and "go home". She is sleeping well at night, no wandering, not leaving the bedroom. Walking is fine. Patient says "I feel fine" except for the "crick in my neck". Neck hurts sometimes, tightness. Heating pad helps.   Highly encouraged f/u with pcp and restarting BP medications.    History 08/30/2014: Patient was given Risperdal bid to help manage patient's delusions with prn Haldol ONLY if patient became aggressive, however husband was overdosing patient which likely contributed to falls as well extrapyramidal symptoms. Patient has had several falls recently, and today presents with neck pain and a new lesion on her right collarbone.  Patient is getting 0.5mg  Risperdal daily in the morning for agitation. Her neck hurts today since a fall. She fell a week ago on Monday. She had CT of the neck and sacrum 2 days ago which did not show fractures. This morning husband  noticed a bruise on her collar bone. Her neck is very sore. She is not having behavioral issues anymore. She can't move her neck though. No tremors or stiffness in the rest of the body. She takes benadryl every night for sleep and it doesn't help with the neck stiffness. Husband tried to stop the risperdal a few weeks ago and she started becoming agitated, wanting to go outside and walk, wanted to see her deceased mother. He restarted the Risperdal.  She appears to be having a dystonic reaction in her neck today. She has a red lesion on her right collar bone unclear if it is a new bruise or burn.   Will stop the risperdal and haldol and  dispose of meds. Will start low dose Seroquel as this has fewer EPS side effects. Will start at 25mg  bid and slowly increase to 50mg  bid if needed.  Review of Systems: Patient complains of symptoms per HPI as well as the following symptoms: neck pain, no CP, no SOB. Pertinent negatives per HPI. All others negative.    Social History   Social History  . Marital Status: Married    Spouse Name: Alveta Heimlich  . Number of Children: 1  . Years of Education: 12   Occupational History  . Retired [employer]   Social History Main Topics  . Smoking status: Former Research scientist (life sciences)  . Smokeless tobacco: Never Used  . Alcohol Use: No  . Drug Use: No  . Sexual Activity: Not on file   Other Topics Concern  . Not on file   Social History Narrative   Patient is married. Alveta Heimlich   Patient has a daughter.   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    Caffeine use: none     Family History  Problem Relation Age of Onset  . Dementia Neg Hx     Past Medical History  Diagnosis Date  . High cholesterol   . Cognitive decline   . Dementia   . Hypertension   . Thyroid disease   . UTI (urinary tract infection)   . Arthritis     Past Surgical History  Procedure Laterality Date  . Colon surgery      Current Outpatient Prescriptions  Medication Sig Dispense Refill  . amLODipine (NORVASC) 5 MG tablet Take 5 mg by mouth daily.    Marland Kitchen aspirin EC 81 MG tablet Take 1 tablet (81 mg total) by mouth daily. 30 tablet 6  . atorvastatin (LIPITOR) 10 MG tablet Take 10 mg by mouth at bedtime.  6  . diclofenac sodium (VOLTAREN) 1 % GEL Apply 2 g topically 4 (four) times daily. On neck muscles. 100 g 6  . divalproex (DEPAKOTE ER) 250 MG 24 hr tablet Take 1 tablet (250 mg total) by mouth daily with breakfast.    . haloperidol (HALDOL) 0.5 MG tablet Take 1 tablet (0.5 mg total) by mouth 2 (two) times daily as needed (Only for severe agitation). Do not exceed 10 tabs monthly  (Patient taking differently: Take 0.25-0.5 mg by mouth 2 (two) times daily as needed (Only for severe agitation). Do not exceed 10 tabs monthly) 60 tablet 11  . levothyroxine (SYNTHROID, LEVOTHROID) 50 MCG tablet Take 50 mcg by mouth daily before breakfast.    . Memantine HCl-Donepezil HCl (NAMZARIC) 28-10 MG CP24 Take 1 capsule by mouth daily.     No current facility-administered medications for this visit.    Allergies as of 07/14/2015 -  Review Complete 07/14/2015  Allergen Reaction Noted  . Ambien [zolpidem tartrate]  10/23/2013  . Amoxicillin  10/23/2013    Vitals: BP 116/80 mmHg  Pulse 94  Wt 146 lb 12.8 oz (66.588 kg)  SpO2 96% Last Weight:  Wt Readings from Last 1 Encounters:  07/14/15 146 lb 12.8 oz (66.588 kg)   Last Height:   Ht Readings from Last 1 Encounters:  06/14/15 5\' 7"  (1.702 m)     CV: RRR, no MRG.  Gen: NAD. She remembers the year she was born but not how old she is. Oriented to self but not year.Follows simple commands. Her neck has improved range of motion, improved dystonia.  Speech:  No aphasia Cranial Nerves:  The pupils are equal, round, and reactive to light. Visual fields are full to threat. Extraocular movements are intact. Trigeminal sensation is intact and the muscles of mastication are normal. The face is symmetric. The palate elevates in the midline. Hearing intact. Voice is normal. Shoulder shrug is normal. The tongue has normal motion without fasciculations.   Motor 5/5.  Gait:  shuffling but if you ask her to take big steps her gait improves to normal and she has normal long strides.   Motor Observation:  Mild postural tremor.   Tone:   Increased cervical muscle tone has improved. She has some mild cogwheeling in the upper extremities.     Assessment/Plan: 82 year old female who is here for follow up of Dementia with behavioral disturbances and delusions. She is having delusions, wandering, getting lost, calling  the police, severely agitated at times.Significant decline in the last year. She lives with her husband of 15 years and they live independently.  Neuropsychiatric medications managed by DR. PLOVSKY. She is taking Depakote 250mg  qam, seroquel 100mg  qpm and possibly an additional 50mg  seroquel if needed per Dr. Casimiro Needle. Also, per Dr. Casimiro Needle, she takes haldol prn up to 0.5mg  bid.    Parkinsonism may be caused by increase in Haldol and seroquel. These medications are managed by Dr. Casimiro Needle. Have encouraged them to call his office to discuss. Feel husband overuses Haldol. Have discussed multiple times with daughter and husband that patient should be in a specialized memory unit or other facility. But family is desperately trying to keep her at home. Daughter helps father manage medications remotely by color of the pill. We have had nurses in the home to help, also currently with PT/OT at home. PCP very aware.   Have been trying to encourage daughter and husband to place patient in a facility for many months now. Daughter and husband are resistant. Have also recommended going to the emergency room on many occasions. Have encouraged follow up with primary care for Bp management and evaluation of other metabolic/toxic etiologies for increased confusion.  Discussed multiple times with patient's husband and daughter that antipsychotic medications can help in dementia with behavioral disturbances however these medications carry significant risk of sedation, falls, extrapyramidal symptoms, as well as an increased risk of death in the elderly. We need to use these medications sparingly and if patient cannot be safely managed at home they need to consider placing the patient unfortunately in a facility where she could be cared for 24 x 7 safely. They acknowledged understanding of risks on multiple occassions.   PT/OT, nursing for help with medication management and home safety assessment ordered last appointment as  well as home nursing. discussed with daughter. Continue current meds and will keep a close eye on patient and husband.  FOLLOW UP WITH DR. Casimiro Needle for seroquel, depakote and haldol management.  Continue namzeric for dementia.     Sarina Ill, MD  [facility] [address] [place],  [address]  Phone [phone] Fax [phone]  A total of 30 minutes was spent face-to-face with this patient. Over half this time was spent on counseling patient on the dementia diagnosis and different diagnostic and therapeutic options available.

## 2015-07-15 ENCOUNTER — Telehealth: Payer: Self-pay | Admitting: Neurology

## 2015-07-15 ENCOUNTER — Ambulatory Visit (HOSPITAL_COMMUNITY): Payer: Medicare Other | Admitting: Psychiatry

## 2015-07-15 LAB — COMPREHENSIVE METABOLIC PANEL
ALT: 9 IU/L (ref 0–32)
AST: 18 IU/L (ref 0–40)
Albumin/Globulin Ratio: 1.8 (ref 1.2–2.2)
Albumin: 4 g/dL (ref 3.5–4.8)
Alkaline Phosphatase: 88 IU/L (ref 39–117)
BILIRUBIN TOTAL: 0.5 mg/dL (ref 0.0–1.2)
BUN / CREAT RATIO: 13 (ref 12–28)
BUN: 15 mg/dL (ref 8–27)
CHLORIDE: 102 mmol/L (ref 96–106)
CO2: 24 mmol/L (ref 18–29)
CREATININE: 1.16 mg/dL — AB (ref 0.57–1.00)
Calcium: 9.3 mg/dL (ref 8.7–10.3)
GFR, EST AFRICAN AMERICAN: 52 mL/min/{1.73_m2} — AB (ref >59)
GFR, EST NON AFRICAN AMERICAN: 45 mL/min/{1.73_m2} — AB (ref >59)
GLUCOSE: 83 mg/dL (ref 65–99)
Globulin, Total: 2.2 g/dL (ref 1.5–4.5)
Potassium: 4.3 mmol/L (ref 3.5–5.2)
Sodium: 142 mmol/L (ref 134–144)
TOTAL PROTEIN: 6.2 g/dL (ref 6.0–8.5)

## 2015-07-15 LAB — VALPROIC ACID LEVEL: Valproic Acid Lvl: 22 ug/mL — ABNORMAL LOW (ref 50–100)

## 2015-07-15 LAB — CBC
HEMATOCRIT: 43.3 % (ref 34.0–46.6)
HEMOGLOBIN: 14.3 g/dL (ref 11.1–15.9)
MCH: 29.7 pg (ref 26.6–33.0)
MCHC: 33 g/dL (ref 31.5–35.7)
MCV: 90 fL (ref 79–97)
Platelets: 189 10*3/uL (ref 150–379)
RBC: 4.82 x10E6/uL (ref 3.77–5.28)
RDW: 14.8 % (ref 12.3–15.4)
WBC: 7.1 10*3/uL (ref 3.4–10.8)

## 2015-07-15 NOTE — Telephone Encounter (Signed)
[name]/[facility] [phone] called to advise, approximately (1) hour before he arrived, husband states patient fell, husband didn't witness the fall but heard it. Patient denies hitting head, injuries.

## 2015-07-15 NOTE — Telephone Encounter (Signed)
Thanks

## 2015-07-18 ENCOUNTER — Telehealth: Payer: Self-pay | Admitting: *Deleted

## 2015-07-18 NOTE — Telephone Encounter (Signed)
Called and spoke to daughter, [name] about labs per Dr Jaynee Eagles note. Also called and spoke to husband Alveta Heimlich about labs. Highly encouraged that she drink more fluid throughout the day to prevent another admission d/t dehydration. He verbalized understanding.

## 2015-07-18 NOTE — Telephone Encounter (Signed)
-----   Message from Melvenia Beam, MD sent at 07/16/2015  5:14 PM EDT ----- Patient's creatinine is a little elevated as compared to a month ago. I would like her to drink more fluid given her recent admission with dehydration. Thanks.

## 2015-07-21 ENCOUNTER — Telehealth: Payer: Self-pay | Admitting: Neurology

## 2015-07-21 NOTE — Telephone Encounter (Signed)
Called daughter, [name] back. Relayed per Dr Felecia Shelling to lower dose of seroquel to 50mg  at night.and to keep f/u appt for this upcoming Tuesday with Dr Jaynee Eagles. She verbalized understanding.

## 2015-07-21 NOTE — Telephone Encounter (Signed)
Called [name] back from [facility]. He stated her gait today reminds him of someone with parkinson's disease. They did a 20 min to walk about 75 ft. With and without assistive device. Starting pill rolling in left hand (parkinson's sign). First time seeing this. And she had "freezing" moments where she could not walk. This happened about 5 times. Daughter mentioned something about checking the depakote level. Advised I will have Dr Felecia Shelling [facility]) check because pt had depakote level checked last Thursday. He is going to see her next this up coming Tuesday. Daughter, Brooke Watson was on the other line and was going to call our office. Advised I will call her and schedule a f/u next week with her. He advised they would like to be seen as soon as possible.   Called and spoke to [name], daughter. Made f/u for 07/26/15 at 10am, check in 930am. Per daughter- Husband still giving .25mg  haldol 1-2 times per day.He is only giving her 100mg  seroquel per night. She wants to know if they can decrease dose to 50mg  seroquel at night? Instead of 100mg . They still have 50mg  they could give her. Daughter spoke to father and asked him to move her minimally. Pt did go to church on Sunday and was doing okay then.  Daughter states she cannot process on how to correct gait like when she was in office last week. Daughter thinks she is safe at home with husband. Husband gives nighttime meds around 330-4pm per daughter.

## 2015-07-21 NOTE — Telephone Encounter (Signed)
They may try a lower dose of Seroquel (50 mg) at night.

## 2015-07-21 NOTE — Telephone Encounter (Signed)
Brooke Watson with [facility] is calling regarding the patient. Brooke Watson saw the patient today and states he is concerned with the patient's function mobility and with gait. The patient's husband is having a hard time helping the patient with walking. Please call to discuss.

## 2015-07-22 NOTE — Telephone Encounter (Signed)
Called and spoke to daughter. Relayed Dr Jaynee Eagles message about Dr Casimiro Needle. She will try and call today. But she said everytime she has tried to call he has not gotten back with them. she is transferred to his VM and he does not call back. she calls the office back and they tell her "o he doesn't check his VM". Advised I will let Dr Jaynee Eagles know and we will f/u with them at upcoming appt on Tuesday. She verbalized understanding and appreciation.

## 2015-07-22 NOTE — Telephone Encounter (Signed)
Brooke Watson, I sent them to Dr. Casimiro Needle to manage the haldol and seroquel and he has been managing the dosing. These meds can cause her parkinsonism. Please have them call Dr. Casimiro Needle today asap and ask him about the haldol and seroquel. He had increased the haldol.. They need to call him. Daughter's number is [phone]. thanks

## 2015-07-25 ENCOUNTER — Telehealth: Payer: Self-pay | Admitting: *Deleted

## 2015-07-25 ENCOUNTER — Encounter (HOSPITAL_COMMUNITY): Payer: Self-pay | Admitting: Emergency Medicine

## 2015-07-25 ENCOUNTER — Inpatient Hospital Stay (HOSPITAL_COMMUNITY)
Admission: EM | Admit: 2015-07-25 | Discharge: 2015-08-05 | DRG: 085 | Disposition: A | Discharge status: Skilled Nursing Facility | Payer: Medicare Other | Attending: Internal Medicine | Admitting: Internal Medicine

## 2015-07-25 ENCOUNTER — Observation Stay (HOSPITAL_COMMUNITY): Payer: Medicare Other

## 2015-07-25 ENCOUNTER — Inpatient Hospital Stay (HOSPITAL_COMMUNITY): Payer: Medicare Other

## 2015-07-25 ENCOUNTER — Emergency Department (HOSPITAL_COMMUNITY): Payer: Medicare Other

## 2015-07-25 DIAGNOSIS — Z79899 Other long term (current) drug therapy: Secondary | ICD-10-CM | POA: Diagnosis not present

## 2015-07-25 DIAGNOSIS — R1310 Dysphagia, unspecified: Secondary | ICD-10-CM | POA: Diagnosis not present

## 2015-07-25 DIAGNOSIS — Z8673 Personal history of transient ischemic attack (TIA), and cerebral infarction without residual deficits: Secondary | ICD-10-CM

## 2015-07-25 DIAGNOSIS — G9349 Other encephalopathy: Secondary | ICD-10-CM | POA: Diagnosis present

## 2015-07-25 DIAGNOSIS — G9340 Encephalopathy, unspecified: Secondary | ICD-10-CM | POA: Diagnosis not present

## 2015-07-25 DIAGNOSIS — Z87891 Personal history of nicotine dependence: Secondary | ICD-10-CM

## 2015-07-25 DIAGNOSIS — E860 Dehydration: Secondary | ICD-10-CM | POA: Diagnosis present

## 2015-07-25 DIAGNOSIS — I611 Nontraumatic intracerebral hemorrhage in hemisphere, cortical: Secondary | ICD-10-CM | POA: Diagnosis present

## 2015-07-25 DIAGNOSIS — Z881 Allergy status to other antibiotic agents status: Secondary | ICD-10-CM

## 2015-07-25 DIAGNOSIS — E7800 Pure hypercholesterolemia, unspecified: Secondary | ICD-10-CM | POA: Diagnosis present

## 2015-07-25 DIAGNOSIS — R4182 Altered mental status, unspecified: Secondary | ICD-10-CM

## 2015-07-25 DIAGNOSIS — E039 Hypothyroidism, unspecified: Secondary | ICD-10-CM | POA: Diagnosis present

## 2015-07-25 DIAGNOSIS — S06369A Traumatic hemorrhage of cerebrum, unspecified, with loss of consciousness of unspecified duration, initial encounter: Secondary | ICD-10-CM | POA: Diagnosis not present

## 2015-07-25 DIAGNOSIS — R627 Adult failure to thrive: Secondary | ICD-10-CM | POA: Diagnosis present

## 2015-07-25 DIAGNOSIS — I10 Essential (primary) hypertension: Secondary | ICD-10-CM | POA: Diagnosis not present

## 2015-07-25 DIAGNOSIS — Z888 Allergy status to other drugs, medicaments and biological substances status: Secondary | ICD-10-CM | POA: Diagnosis not present

## 2015-07-25 DIAGNOSIS — S065X0A Traumatic subdural hemorrhage without loss of consciousness, initial encounter: Principal | ICD-10-CM | POA: Diagnosis present

## 2015-07-25 DIAGNOSIS — Z6824 Body mass index (BMI) 24.0-24.9, adult: Secondary | ICD-10-CM | POA: Diagnosis not present

## 2015-07-25 DIAGNOSIS — L8992 Pressure ulcer of unspecified site, stage 2: Secondary | ICD-10-CM | POA: Diagnosis present

## 2015-07-25 DIAGNOSIS — E440 Moderate protein-calorie malnutrition: Secondary | ICD-10-CM | POA: Diagnosis present

## 2015-07-25 DIAGNOSIS — E038 Other specified hypothyroidism: Secondary | ICD-10-CM | POA: Diagnosis present

## 2015-07-25 DIAGNOSIS — R531 Weakness: Secondary | ICD-10-CM | POA: Diagnosis present

## 2015-07-25 DIAGNOSIS — Z515 Encounter for palliative care: Secondary | ICD-10-CM | POA: Diagnosis not present

## 2015-07-25 DIAGNOSIS — Z66 Do not resuscitate: Secondary | ICD-10-CM | POA: Diagnosis present

## 2015-07-25 DIAGNOSIS — D181 Lymphangioma, any site: Secondary | ICD-10-CM | POA: Diagnosis present

## 2015-07-25 DIAGNOSIS — S065X9A Traumatic subdural hemorrhage with loss of consciousness of unspecified duration, initial encounter: Secondary | ICD-10-CM

## 2015-07-25 DIAGNOSIS — F0391 Unspecified dementia with behavioral disturbance: Secondary | ICD-10-CM | POA: Diagnosis not present

## 2015-07-25 DIAGNOSIS — L8990 Pressure ulcer of unspecified site, unspecified stage: Secondary | ICD-10-CM | POA: Diagnosis present

## 2015-07-25 DIAGNOSIS — M1990 Unspecified osteoarthritis, unspecified site: Secondary | ICD-10-CM | POA: Diagnosis present

## 2015-07-25 DIAGNOSIS — Z7982 Long term (current) use of aspirin: Secondary | ICD-10-CM

## 2015-07-25 DIAGNOSIS — I619 Nontraumatic intracerebral hemorrhage, unspecified: Secondary | ICD-10-CM

## 2015-07-25 DIAGNOSIS — F03918 Unspecified dementia, unspecified severity, with other behavioral disturbance: Secondary | ICD-10-CM

## 2015-07-25 DIAGNOSIS — I6200 Nontraumatic subdural hemorrhage, unspecified: Secondary | ICD-10-CM | POA: Diagnosis present

## 2015-07-25 DIAGNOSIS — W19XXXA Unspecified fall, initial encounter: Secondary | ICD-10-CM | POA: Diagnosis present

## 2015-07-25 DIAGNOSIS — Z7189 Other specified counseling: Secondary | ICD-10-CM | POA: Diagnosis present

## 2015-07-25 DIAGNOSIS — E639 Nutritional deficiency, unspecified: Secondary | ICD-10-CM

## 2015-07-25 DIAGNOSIS — Z4659 Encounter for fitting and adjustment of other gastrointestinal appliance and device: Secondary | ICD-10-CM

## 2015-07-25 DIAGNOSIS — S065XAA Traumatic subdural hemorrhage with loss of consciousness status unknown, initial encounter: Secondary | ICD-10-CM | POA: Diagnosis present

## 2015-07-25 LAB — COMPREHENSIVE METABOLIC PANEL
ALBUMIN: 4.1 g/dL (ref 3.5–5.0)
ALT: 20 U/L (ref 14–54)
AST: 45 U/L — AB (ref 15–41)
Alkaline Phosphatase: 85 U/L (ref 38–126)
Anion gap: 9 (ref 5–15)
BUN: 18 mg/dL (ref 6–20)
CHLORIDE: 107 mmol/L (ref 101–111)
CO2: 26 mmol/L (ref 22–32)
CREATININE: 1.14 mg/dL — AB (ref 0.44–1.00)
Calcium: 9.1 mg/dL (ref 8.9–10.3)
GFR calc Af Amer: 52 mL/min — ABNORMAL LOW (ref >60)
GFR calc non Af Amer: 45 mL/min — ABNORMAL LOW (ref >60)
GLUCOSE: 116 mg/dL — AB (ref 65–99)
POTASSIUM: 4.2 mmol/L (ref 3.5–5.1)
Sodium: 142 mmol/L (ref 135–145)
Total Bilirubin: 1.3 mg/dL — ABNORMAL HIGH (ref 0.3–1.2)
Total Protein: 7.4 g/dL (ref 6.5–8.1)

## 2015-07-25 LAB — PROTIME-INR
INR: 1.12 (ref 0.00–1.49)
PROTHROMBIN TIME: 14.6 s (ref 11.6–15.2)

## 2015-07-25 LAB — CBC WITH DIFFERENTIAL/PLATELET
Basophils Absolute: 0 10*3/uL (ref 0.0–0.1)
Basophils Relative: 0 %
EOS ABS: 0 10*3/uL (ref 0.0–0.7)
EOS PCT: 0 %
HCT: 44.2 % (ref 36.0–46.0)
Hemoglobin: 14.9 g/dL (ref 12.0–15.0)
LYMPHS ABS: 2.1 10*3/uL (ref 0.7–4.0)
LYMPHS PCT: 20 %
MCH: 30.2 pg (ref 26.0–34.0)
MCHC: 33.7 g/dL (ref 30.0–36.0)
MCV: 89.7 fL (ref 78.0–100.0)
MONO ABS: 1.3 10*3/uL — AB (ref 0.1–1.0)
MONOS PCT: 12 %
Neutro Abs: 6.8 10*3/uL (ref 1.7–7.7)
Neutrophils Relative %: 68 %
PLATELETS: 206 10*3/uL (ref 150–400)
RBC: 4.93 MIL/uL (ref 3.87–5.11)
RDW: 14.3 % (ref 11.5–15.5)
WBC: 10.2 10*3/uL (ref 4.0–10.5)

## 2015-07-25 LAB — URINALYSIS, ROUTINE W REFLEX MICROSCOPIC
BILIRUBIN URINE: NEGATIVE
GLUCOSE, UA: NEGATIVE mg/dL
Ketones, ur: NEGATIVE mg/dL
Leukocytes, UA: NEGATIVE
Nitrite: NEGATIVE
PROTEIN: NEGATIVE mg/dL
Specific Gravity, Urine: 1.022 (ref 1.005–1.030)
pH: 6 (ref 5.0–8.0)

## 2015-07-25 LAB — MRSA PCR SCREENING: MRSA BY PCR: NEGATIVE

## 2015-07-25 LAB — URINE MICROSCOPIC-ADD ON
BACTERIA UA: NONE SEEN
Squamous Epithelial / LPF: NONE SEEN

## 2015-07-25 LAB — TSH: TSH: 4.273 u[IU]/mL (ref 0.350–4.500)

## 2015-07-25 LAB — VALPROIC ACID LEVEL: VALPROIC ACID LVL: 18 ug/mL — AB (ref 50.0–100.0)

## 2015-07-25 MED ORDER — SODIUM CHLORIDE 0.9% FLUSH
3.0000 mL | Freq: Two times a day (BID) | INTRAVENOUS | Status: DC
  Administered 2015-07-26 – 2015-07-27 (×3): 3 mL via INTRAVENOUS

## 2015-07-25 MED ORDER — ONDANSETRON HCL 4 MG/2ML IJ SOLN: 4.0000 mg | Freq: Three times a day (TID) | INTRAMUSCULAR | Status: AC | PRN

## 2015-07-25 MED ORDER — ACETAMINOPHEN 650 MG RE SUPP: 650.0000 mg | Freq: Four times a day (QID) | RECTAL | Status: DC | PRN

## 2015-07-25 MED ORDER — LABETALOL HCL 5 MG/ML IV SOLN
20.0000 mg | INTRAVENOUS | Status: DC | PRN
  Administered 2015-07-25 – 2015-08-01 (×13): 20 mg via INTRAVENOUS
  Filled 2015-07-25 (×15): qty 4

## 2015-07-25 MED ORDER — DEXTROSE 5 % IV SOLN
250.0000 mg | Freq: Two times a day (BID) | INTRAVENOUS | Status: DC
  Administered 2015-07-25 – 2015-07-28 (×6): 250 mg via INTRAVENOUS
  Filled 2015-07-25 (×8): qty 2.5

## 2015-07-25 MED ORDER — ACETAMINOPHEN 325 MG PO TABS: 650.0000 mg | ORAL_TABLET | Freq: Four times a day (QID) | ORAL | Status: DC | PRN

## 2015-07-25 MED ORDER — IOPAMIDOL (ISOVUE-370) INJECTION 76%
100.0000 mL | Freq: Once | INTRAVENOUS | Status: AC | PRN
  Administered 2015-07-25: 100 mL via INTRAVENOUS

## 2015-07-25 MED ORDER — LEVOTHYROXINE SODIUM 100 MCG IV SOLR
25.0000 ug | Freq: Every day | INTRAVENOUS | Status: DC
  Administered 2015-07-26 – 2015-07-28 (×3): 25 ug via INTRAVENOUS
  Filled 2015-07-25 (×3): qty 5

## 2015-07-25 MED ORDER — NICARDIPINE HCL IN NACL 20-0.86 MG/200ML-% IV SOLN
5.0000 mg/h | Freq: Once | INTRAVENOUS | Status: AC
  Administered 2015-07-25: 5 mg/h via INTRAVENOUS
  Filled 2015-07-25: qty 200

## 2015-07-25 MED ORDER — SODIUM CHLORIDE 0.9 % IV SOLN
INTRAVENOUS | Status: AC
Start: 2015-07-25 — End: 2015-07-26
  Administered 2015-07-25 – 2015-07-26 (×2): via INTRAVENOUS

## 2015-07-25 NOTE — Telephone Encounter (Signed)
Dr Jaynee Eagles- FYI  Daughter, [name] called back. Husband is going to bring her to ER today since she is so lethargic. Home health nurse came to evaluate her and took her BP and P. BP: 110/60 and P: 60. She highly encouraged them to bring her to ER to further evaluate her. I also encouraged her to go to ER. Daughter called and cx Dr Casimiro Needle appt for tomorrow. She will call back to r/s. Advised she put her on cx list, and possibly get in earlier if they are booked out. I told her I will forward message to Dr Jaynee Eagles. She verbalized understanding and appreciation. I cx appt f/u appt with Dr Jaynee Eagles per daughter request.

## 2015-07-25 NOTE — Progress Notes (Signed)
Systolic blood [id number] with nicardipine infusion at 5mg /hr.  Stepdown unit cannot manage this drip.  I spoke with the on-call critical care physician about this patient. Case was reviewed.  Critical care admission declined at this time.  Received recommendations for BP management with IV labetalol boluses.  RN updated on new orders.  Proceed with transfer to stepdown unit.  Will need to call critical care attending back if clinical status changes or if she develops accelerated HTN refractory to IV labetolol.

## 2015-07-25 NOTE — H&P (Signed)
[facility] History and Physical  Brooke Watson [id number] DOB: 05-26-1933 DOA: 07/25/2015  PCP: Gilford Rile, MD  Specialists: Dr. Jaynee Eagles, Neurology  Chief Complaint: Altered mental status, gait disturbance, tremor  HPI: Brooke Watson is a 82 y.o. woman with a history of dementia, TIA, and HTN who was brought to the ED by her husband for evaluation of progressive lethargy, decreased oral intake, gait disturbance since last Thursday, and recent fall (he denied any head trauma).  The patient was admitted in March for dehydration.  She had an extensive work-up then that was fairly unremarkable.  Per review of the medical record, there have been ongoing discussions about whether dose adjustments needed to be made on some of her neuropsych medications.  According to her daughter, they stopped her Haldol on Thursday when her physical therapist first noted marked changes in her gait.  No LOC.  No documented fever.  No N/V/D.  She has had decreased urine output.  No chest pain or shortness of breath.  No abdominal pain. No cought.  When she is well, she eats 2 meals daily.  The family has been trying to encourage her to increase her fluid intake daily.  Currently, she drinks about 1L daily.  Unfortunately, CT scan in the ED shows acute cerebral hemorrhage.  The neurohospitalist was called from the ED.  We are admitting to the stepdown unit at [facility]; consultation to neurosurgery to be facilitated by neurology, once she has been assessed.  Review of Systems: 12 systems reviewed and negative except as stated in the HPI.  Past Medical History  Diagnosis Date  . High cholesterol   . Cognitive decline   . Dementia   . Hypertension   . Thyroid disease   . UTI (urinary tract infection)   . Arthritis    Past Surgical History  Procedure Laterality Date  . Colon surgery     Social History:  Social History   Social History Narrative   Patient is married. Alveta Heimlich   Patient has a  daughter.   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    Caffeine use: none   Patient's daughter [name] lives in [place], but she is very involved in the patient's care/clinical decision making.    Allergies  Allergen Reactions  . Ambien [Zolpidem Tartrate]     Disorientation   . Amoxicillin     Unknown.  Has patient had a PCN reaction causing immediate rash, facial/tongue/throat swelling, SOB or lightheadedness with hypotension: unknown Has patient had a PCN reaction causing severe rash involving mucus membranes or skin necrosis: unknown Has patient had a PCN reaction that required hospitalization No Has patient had a PCN reaction occurring within the last 10 years: unknown If all of the above answers are "NO", then may proceed with Cephalosporin use.     Family History  Problem Relation Age of Onset  The patient's husband tells me that her mother and brother had dementia.  Prior to Admission medications   Medication Sig Start Date End Date Taking? Authorizing Provider  amLODipine (NORVASC) 5 MG tablet Take 5 mg by mouth daily.   Yes Historical Provider, MD  aspirin EC 81 MG tablet Take 1 tablet (81 mg total) by mouth daily. 06/30/14  Yes Melvenia Beam, MD  atorvastatin (LIPITOR) 10 MG tablet Take 10 mg by mouth at bedtime. 06/30/14  Yes Historical Provider, MD  divalproex (DEPAKOTE ER) 250 MG 24 hr tablet Take 1  tablet (250 mg total) by mouth daily with breakfast. 06/15/15  Yes Modena Jansky, MD  haloperidol (HALDOL) 0.5 MG tablet Take 1 tablet (0.5 mg total) by mouth 2 (two) times daily as needed (Only for severe agitation). Do not exceed 10 tabs monthly Patient taking differently: Take 0.25-0.5 mg by mouth 2 (two) times daily as needed (Only for severe agitation). Do not exceed 10 tabs monthly 05/17/15  Yes Melvenia Beam, MD  ibuprofen (ADVIL,MOTRIN) 200 MG tablet Take 400 mg by mouth 2 (two) times daily as needed for moderate pain.   Yes  Historical Provider, MD  levothyroxine (SYNTHROID, LEVOTHROID) 50 MCG tablet Take 50 mcg by mouth daily before breakfast.   Yes Historical Provider, MD  Memantine HCl-Donepezil HCl (NAMZARIC) 28-10 MG CP24 Take 1 capsule by mouth daily.   Yes Historical Provider, MD  QUEtiapine (SEROQUEL) 100 MG tablet Take 100 mg by mouth at bedtime. 07/05/15  Yes Historical Provider, MD   Physical Exam: Filed Vitals:   07/25/15 1500 07/25/15 1549 07/25/15 1600 07/25/15 1625  BP: 149/111 160/101 154/99 157/100  Pulse: 81 78 79 78  Temp:   99.9 F (37.7 C) 99.9 F (37.7 C)  TempSrc:      Resp: 16 14 13 15   SpO2: 95% 95% 96% 95%    General:  Awake but disoriented.  Conversational but cannot correctly answer orientation questions.  Head: [place]/AT  Eyes: Pupils equal and reactive bilaterally  ENT: Mucous membranes appear dry.  She will not fully open her mouth for exam.  Cardiovascular: NR/RR. Trace LE edema.  Respiratory: No wheeze or ronchi, listening anteriorly.  GI: Abdomen is soft/NT/ND.  Bowel sounds are present.  No guarding.  Skin: Warm and dry.  Musculoskeletal: Resting tremor noted.  She has spontaneous movement in all four extremities, even though she is not consistently following commands.  Psychiatric: Flat affect.  Neurologic: Global encephalopathy.   Labs on Admission:  Basic Metabolic Panel:  Recent Labs Lab 07/25/15 1254  NA 142  K 4.2  CL 107  CO2 26  GLUCOSE 116*  BUN 18  CREATININE 1.14*  CALCIUM 9.1   Liver Function Tests:  Recent Labs Lab 07/25/15 1254  AST 45*  ALT 20  ALKPHOS 85  BILITOT 1.3*  PROT 7.4  ALBUMIN 4.1   CBC:  Recent Labs Lab 07/25/15 1254  WBC 10.2  NEUTROABS 6.8  HGB 14.9  HCT 44.2  MCV 89.7  PLT 206   Radiological Exams on Admission: Ct Head Wo Contrast  07/25/2015  CLINICAL DATA:  Altered mental status after fall 1 day prior EXAM: CT HEAD WITHOUT CONTRAST TECHNIQUE: Contiguous axial images were obtained from the base  of the skull through the vertex without intravenous contrast. COMPARISON:  Head CT June 13, 2015 and brain MRI June 14, 2015 FINDINGS: There is focal hemorrhage arising in the rostrum of the corpus callosum measuring 1.9 x 1.9 cm. Hemorrhage extends superiorly to the right parafalcine region, likely with a degree of parafalcine subdural hematoma. The maximum thickness in this area measures 1.0 cm. There is also tentorial subdural hematoma anteriorly on the right with a maximum thickness of 6 mm. Intraparenchymal hemorrhage is noted at the high frontal-parietal junction measuring 1.3 x 1.2 cm. There is no midline shift. There is moderate generalized atrophy. No mass lesion is seen. There is no midline shift. No epidural fluid collections are identified. There is patchy small vessel disease in the centra semiovale bilaterally. No acute infarct evident. Bony calvarium appears  intact. The mastoid air cells are clear. No intraorbital lesions are evident. IMPRESSION: Right parafalcine in anterior right tentorial subdural hematomas. Parenchymal hemorrhage is seen in the corpus callosum anteriorly as well as at the right frontal -parietal junction near the vertex. No midline shift. Underlying atrophy with small vessel disease in the supratentorial white matter, stable. Critical Value/emergent results were called by telephone at the time of interpretation on 07/25/2015 at 3:50 pm to Dr. Leonard Schwartz , who verbally acknowledged these results. Electronically Signed   By: Lowella Grip III M.D.   On: 07/25/2015 15:50   Dg Chest Portable 1 View  07/25/2015  CLINICAL DATA:  Recent fall with difficulty moving EXAM: PORTABLE CHEST 1 VIEW COMPARISON:  06/13/2015 FINDINGS: Cardiac shadow is within normal limits. The thoracic aorta is tortuous but otherwise within normal limits. The lungs are well aerated bilaterally without focal infiltrate. No acute bony abnormality is seen. IMPRESSION: No active disease. Electronically  Signed   By: Inez Catalina M.D.   On: 07/25/2015 13:16    EKG: PENDING  Assessment/Plan Active Problems:   Dementia with behavioral disturbance   Acute encephalopathy   Dehydration   Acute cerebral hemorrhage (HCC)   Accelerated hypertension  Admit to step down unit at [facility] per neurology recommendations  Acute cerebral hemorrhage causing acute encephalopathy --CTA head pending --NPO --No anticoagulants --Hold aspirin, NSAIDs --Goal systolic blood pressure [id number] per neurology --Seizure precautions --Frequent neurochecks --Dr. Silverio Decamp to see the patient upon arrival to [facility]  Accelerated HTN --Nicardipine infusion to be titrated as needed for target systolic blood pressure as outlined above  History of dementia --Will check depakote level --Holding home medications for now due to mental status changes  Mildly abnormal LFTs noted --Will repeat in the AM to look for trend --Coags pending  Hypothyroidism --IV levothyroxine while NPO --Check TSH  Code Status: FULL CODE Family Communication: Husband at bedside, daugther by  phone Disposition Plan: Expect her to be here at least two midnights  Time spent: 75 minutes  Eber Jones [facility]  07/25/2015, 4:57 PM

## 2015-07-25 NOTE — ED Provider Notes (Addendum)
CSN: [id number]     Arrival date & time 07/25/15  1237 History   First MD Initiated Contact with Patient 07/25/15 1243     Chief Complaint  Patient presents with  . Weakness      HPI  Expand All Collapse All   Per spouse patient fell yesterday. Spouse denies injury.States she fell her tailbone and did not hit her head. Reports she went to sleep and woke up this morning but would not get up or move.        According to her husband she's not been eating or drinking as normal for the last few days.  He says she got this way last month and had to be admitted for IV fluids for day or 2.  He states she was walking yesterday. Past Medical History  Diagnosis Date  . High cholesterol   . Cognitive decline   . Dementia   . Hypertension   . Thyroid disease   . UTI (urinary tract infection)   . Arthritis    Past Surgical History  Procedure Laterality Date  . Colon surgery     Family History  Problem Relation Age of Onset  . Dementia Neg Hx    Social History  Substance Use Topics  . Smoking status: Former Research scientist (life sciences)  . Smokeless tobacco: Never Used  . Alcohol Use: No   OB History    No data available     Review of Systems  Unable to perform ROS: Dementia      Allergies  Ambien and Amoxicillin  Home Medications   Prior to Admission medications   Medication Sig Start Date End Date Taking? Authorizing Provider  amLODipine (NORVASC) 5 MG tablet Take 5 mg by mouth daily.   Yes Historical Provider, MD  aspirin EC 81 MG tablet Take 1 tablet (81 mg total) by mouth daily. 06/30/14  Yes Melvenia Beam, MD  atorvastatin (LIPITOR) 10 MG tablet Take 10 mg by mouth at bedtime. 06/30/14  Yes Historical Provider, MD  divalproex (DEPAKOTE ER) 250 MG 24 hr tablet Take 1 tablet (250 mg total) by mouth daily with breakfast. 06/15/15  Yes Modena Jansky, MD  haloperidol (HALDOL) 0.5 MG tablet Take 1 tablet (0.5 mg total) by mouth 2 (two) times daily as needed (Only for severe agitation). Do  not exceed 10 tabs monthly Patient taking differently: Take 0.25-0.5 mg by mouth 2 (two) times daily as needed (Only for severe agitation). Do not exceed 10 tabs monthly 05/17/15  Yes Melvenia Beam, MD  ibuprofen (ADVIL,MOTRIN) 200 MG tablet Take 400 mg by mouth 2 (two) times daily as needed for moderate pain.   Yes Historical Provider, MD  levothyroxine (SYNTHROID, LEVOTHROID) 50 MCG tablet Take 50 mcg by mouth daily before breakfast.   Yes Historical Provider, MD  Memantine HCl-Donepezil HCl (NAMZARIC) 28-10 MG CP24 Take 1 capsule by mouth daily.   Yes Historical Provider, MD  QUEtiapine (SEROQUEL) 100 MG tablet Take 100 mg by mouth at bedtime. 07/05/15  Yes Historical Provider, MD  diclofenac sodium (VOLTAREN) 1 % GEL Apply 2 g topically 4 (four) times daily. On neck muscles. 04/14/15   Melvenia Beam, MD   BP 154/99 mmHg  Pulse 79  Temp(Src) 99.9 F (37.7 C) (Oral)  Resp 13  SpO2 96% Physical Exam  Constitutional: She appears well-developed and well-nourished. She appears lethargic. No distress.  HENT:  Head: Normocephalic and atraumatic.  Mouth/Throat: Mucous membranes are dry.  Eyes: Pupils are equal, round, and  reactive to light.  Neck: Normal range of motion.  Cardiovascular: Normal rate and intact distal pulses.   Pulmonary/Chest: No respiratory distress.  Abdominal: Normal appearance. She exhibits no distension. There is no tenderness. There is no rebound.  Neurological: She appears lethargic. She displays tremor. GCS eye subscore is 3. GCS verbal subscore is 5. GCS motor subscore is 5.  Skin: Skin is warm and dry. No rash noted.  Psychiatric: Her speech is delayed.  Nursing note and vitals reviewed.   ED Course  Procedures (including critical care time) CRITICAL CARE Performed by: Leonard Schwartz L Total critical care time: 30 minutes Critical care time was exclusive of separately billable procedures and treating other patients. Critical care was necessary to treat or  prevent imminent or life-threatening deterioration. Critical care was time spent personally by me on the following activities: development of treatment plan with patient and/or surrogate as well as nursing, discussions with consultants, evaluation of patient's response to treatment, examination of patient, obtaining history from patient or surrogate, ordering and performing treatments and interventions, ordering and review of laboratory studies, ordering and review of radiographic studies, pulse oximetry and re-evaluation of patient's condition.    Labs Review Labs Reviewed  COMPREHENSIVE METABOLIC PANEL - Abnormal; Notable for the following:    Glucose, Bld 116 (*)    Creatinine, Ser 1.14 (*)    AST 45 (*)    Total Bilirubin 1.3 (*)    GFR calc non Af Amer 45 (*)    GFR calc Af Amer 52 (*)    All other components within normal limits  CBC WITH DIFFERENTIAL/PLATELET - Abnormal; Notable for the following:    Monocytes Absolute 1.3 (*)    All other components within normal limits  URINALYSIS, ROUTINE W REFLEX MICROSCOPIC (NOT AT [facility]) - Abnormal; Notable for the following:    Color, Urine AMBER (*)    Hgb urine dipstick TRACE (*)    All other components within normal limits  URINE CULTURE  URINE MICROSCOPIC-ADD ON    Imaging Review Ct Head Wo Contrast  07/25/2015  CLINICAL DATA:  Altered mental status after fall 1 day prior EXAM: CT HEAD WITHOUT CONTRAST TECHNIQUE: Contiguous axial images were obtained from the base of the skull through the vertex without intravenous contrast. COMPARISON:  Head CT June 13, 2015 and brain MRI June 14, 2015 FINDINGS: There is focal hemorrhage arising in the rostrum of the corpus callosum measuring 1.9 x 1.9 cm. Hemorrhage extends superiorly to the right parafalcine region, likely with a degree of parafalcine subdural hematoma. The maximum thickness in this area measures 1.0 cm. There is also tentorial subdural hematoma anteriorly on the right with a maximum  thickness of 6 mm. Intraparenchymal hemorrhage is noted at the high frontal-parietal junction measuring 1.3 x 1.2 cm. There is no midline shift. There is moderate generalized atrophy. No mass lesion is seen. There is no midline shift. No epidural fluid collections are identified. There is patchy small vessel disease in the centra semiovale bilaterally. No acute infarct evident. Bony calvarium appears intact. The mastoid air cells are clear. No intraorbital lesions are evident. IMPRESSION: Right parafalcine in anterior right tentorial subdural hematomas. Parenchymal hemorrhage is seen in the corpus callosum anteriorly as well as at the right frontal -parietal junction near the vertex. No midline shift. Underlying atrophy with small vessel disease in the supratentorial white matter, stable. Critical Value/emergent results were called by telephone at the time of interpretation on 07/25/2015 at 3:50 pm to Dr. Leonard Schwartz ,  who verbally acknowledged these results. Electronically Signed   By: Lowella Grip III M.D.   On: 07/25/2015 15:50   Dg Chest Portable 1 View  07/25/2015  CLINICAL DATA:  Recent fall with difficulty moving EXAM: PORTABLE CHEST 1 VIEW COMPARISON:  06/13/2015 FINDINGS: Cardiac shadow is within normal limits. The thoracic aorta is tortuous but otherwise within normal limits. The lungs are well aerated bilaterally without focal infiltrate. No acute bony abnormality is seen. IMPRESSION: No active disease. Electronically Signed   By: Inez Catalina M.D.   On: 07/25/2015 13:16   I have personally reviewed and evaluated these images and lab results as part of my medical decision-making.   EKG Interpretation None     I discussed the findings with neurology, Dr. Silverio Decamp and the hospitalist, Dr. Eulas Post, who recommended patient get an intracerebral angiogram and be started on nicardipine for blood pressure control.  Patient to be admitted to the hospitalist service at [facility] and transferred to  stepdown. MDM   Final diagnoses:  Dehydration  Nontraumatic intracerebral hemorrhage, unspecified cerebral location, unspecified laterality [facility])        Leonard Schwartz, MD 07/25/15 [place], MD 07/25/15 2321

## 2015-07-25 NOTE — Telephone Encounter (Signed)
Daughter [name] [phone] called to advise her mom is being transferred to [facility] for intracranial bleed.

## 2015-07-25 NOTE — ED Notes (Signed)
Per spouse patient fell yesterday.  Spouse denies injury.  Reports she went to sleep and woke up this morning but would not get up or move.

## 2015-07-25 NOTE — Telephone Encounter (Signed)
Daughter, [name] called office.  She stated her and her husband went to eat yesterday. When they got home, she fell backwards on bottom step. She landed backwards on butt. She did not hit anything else. Sister was walking right next to her. They could not catch her. She did not hit her head. Today she is very weak. Daughter thinks she is very dehydrated. She drinks 2 bottles water/day. She talked to nurse at home care agency. Home health nurse is coming today at 11am today and is going to evaluate her and see if she needs to go to the hospital. I also recommended she go to hospital if she has had this significant change overnight and if she feels she is severely dehydrated. She states she calls them almost every day and talked to her father about her needed to drink more fluids. She is going to see what home health nurse says and call back to let me know what they are going to do.   Daughter also said they have an appt with Dr Casimiro Needle at 915am tomorrow. They are wanting to come at 1030am instead if they do not go to hospital today. She said they would be able to make it.  I advised again that Dr Jaynee Eagles is out of office today and I will forward message to her. I advised I can ask the covering provider if needed. She verbalized understanding.     [name] from home PT called on Friday and spoke to daughter. She said he stated that pt looked a lot better on Friday. Saturday, they did not see any changes. They have held the haldol since 07/21/15. Her husband has been giving 100mg  seroquel at night.

## 2015-07-25 NOTE — Progress Notes (Signed)
Pt last in hospital 06/13/15- 06/15/15 for observation   Post Acute Care Choice: Home Health Choice offered to: Adult Children, Spouse  DME Arranged: 3-N-1, Walker rolling DME Agency: [place] Arranged: RN, PT, OT, Nurse's Aide, Social Work [employer] Agency: [employer] ([facility]  Returns to [facility] ED today after a fall on buttocks   Made encompass of pt being evaluated in ED

## 2015-07-25 NOTE — ED Notes (Signed)
Bed: RESA Expected date:  Expected time:  Means of arrival:  Comments: 

## 2015-07-25 NOTE — ED Notes (Signed)
MD at bedside. 

## 2015-07-25 NOTE — Consult Note (Addendum)
Neurology Consultation Reason for Consult: ICH, recent falls Referring Physician: Onnie Graham  CC: [facility] [place]  History is obtained from pateint's borther and notes  HPI: Brooke Watson is a 82 y.o. female with a hx of severe dementia.  I am unable to obtain complete history from patient given that she has severe dementia.  Normally she lives at home with husband and is able to walk but has been unsteady.  Evidently she has had behavioral problems because she is currrently on mood stailizers.  No hx sz.  In this setting she fell at home on Sunday and hit her head, the brother tells me.  Notes indicate that pateint ahs been unsteady recently and that because of this her behaviral meds are being held, including depakote.  Per medicine notes: "Brooke Watson is a 82 y.o. woman with a history of dementia, TIA, and HTN who was brought to the ED by her husband for evaluation of progressive lethargy, decreased oral intake, gait disturbance since last Thursday, and recent fall (he denied any head trauma). The patient was admitted in March for dehydration. She had an extensive work-up then that was fairly unremarkable. Per review of the medical record, there have been ongoing discussions about whether dose adjustments needed to be made on some of her neuropsych medications. According to her daughter, they stopped her Haldol on Thursday when her physical therapist first noted marked changes in her gait. No LOC. No documented fever. No N/V/D. She has had decreased urine output. No chest pain or shortness of breath. No abdominal pain. No cought. When she is well, she eats 2 meals daily. The family has been trying to encourage her to increase her fluid intake daily. Currently, she drinks about 1L daily."   ICH Score: 1   ROS:  Unable to obtain due to altered mental status.   Past Medical History  Diagnosis Date  . High cholesterol   . Cognitive decline   . Dementia   . Hypertension   .  Thyroid disease   . UTI (urinary tract infection)   . Arthritis     Family History  Problem Relation Age of Onset  . Dementia Neg Hx     Social History:  reports that she has quit smoking. She has never used smokeless tobacco. She reports that she does not drink alcohol or use illicit drugs.  Exam: Current vital signs: BP 126/86 mmHg  Pulse 90  Temp(Src) 99.7 F (37.6 C) (Oral)  Resp 14  SpO2 94% Vital signs in last 24 hours: Temp:  [98.6 F (37 C)-99.9 F (37.7 C)] 99.7 F (37.6 C) (04/17 1730) Pulse Rate:  [78-92] 90 (04/17 1730) Resp:  [13-18] 14 (04/17 1730) BP: (126-168)/(86-115) 126/86 mmHg (04/17 1730) SpO2:  [94 %-96 %] 94 % (04/17 1730)   Physical Exam  Constitutional: Appears well-developed and well-nourished.  Psych: Affect appropriate to situation Eyes: No scleral injection HENT: No OP obstrucion Head: Normocephalic.  Cardiovascular: Normal rate and regular rhythm.  Respiratory: Effort normal and breath sounds normal to anterior ascultation GI: Soft.  No distension. There is no tenderness.  Skin: WDI  Neuro: Mental Status: Patient keeps eyes closed.  Easily arousable Patient is NOT able to give a clear and coherent history due to dementia - thinks she is home.  Poor participation but able to tell me i am showing 10 figners in front of her face. No signs of aphasia or neglect but has some paucity of speech Cranial Nerves: II: Visual Fields  are full. Pupils are equal, round, and reactive to light. III,IV, VI: poor eye movement in all directions - reminiscent of PSP V: Facial sensation is symmetric to temperature VII: Facial movement is symmetric.  VIII: hearing is intact to voicel XI: Shoulder shrug is symmetric. Motor: Tone is normal. She is tremulous bilaterally Bulk is normal.symmetric strength was present in all four extremities Sensory: Sensation is symmetric to light touch in the arms and legs Deep Tendon Reflexes: areflexic Plantars: Toes  are downgoing bilaterally Cerebellar: Unable to test due to poor copperation   I have reviewed the images obtained: CT head shows multiple brain contusions, many are parafalcine  Impression: ICH, possibly traumatic but the parafalcine aspect of her ICH is unusual for head trauma.  I would like to r/o DST via MRV.  This ahs been ordered.  Maintain BP < 140.  Restart home meds including depakote.  Will need infectious worlup as this is the most likely cause of her recent decline.  Dr. Nyoka Cowden will be following patient in consultation after tomorrow.  Repeat HCT tomorrow around 3pm - will place order  Recommendations: 1) see above

## 2015-07-26 ENCOUNTER — Inpatient Hospital Stay (HOSPITAL_COMMUNITY): Payer: Medicare Other

## 2015-07-26 ENCOUNTER — Ambulatory Visit: Payer: Self-pay | Admitting: Neurology

## 2015-07-26 DIAGNOSIS — I611 Nontraumatic intracerebral hemorrhage in hemisphere, cortical: Secondary | ICD-10-CM

## 2015-07-26 DIAGNOSIS — E038 Other specified hypothyroidism: Secondary | ICD-10-CM | POA: Diagnosis present

## 2015-07-26 DIAGNOSIS — I610 Nontraumatic intracerebral hemorrhage in hemisphere, subcortical: Secondary | ICD-10-CM

## 2015-07-26 DIAGNOSIS — I619 Nontraumatic intracerebral hemorrhage, unspecified: Secondary | ICD-10-CM | POA: Diagnosis present

## 2015-07-26 LAB — COMPREHENSIVE METABOLIC PANEL
ALT: 16 U/L (ref 14–54)
AST: 36 U/L (ref 15–41)
Albumin: 3.1 g/dL — ABNORMAL LOW (ref 3.5–5.0)
Alkaline Phosphatase: 72 U/L (ref 38–126)
Anion gap: 12 (ref 5–15)
BUN: 12 mg/dL (ref 6–20)
CHLORIDE: 107 mmol/L (ref 101–111)
CO2: 24 mmol/L (ref 22–32)
CREATININE: 0.98 mg/dL (ref 0.44–1.00)
Calcium: 8.5 mg/dL — ABNORMAL LOW (ref 8.9–10.3)
GFR, EST NON AFRICAN AMERICAN: 53 mL/min — AB (ref >60)
Glucose, Bld: 116 mg/dL — ABNORMAL HIGH (ref 65–99)
POTASSIUM: 3.5 mmol/L (ref 3.5–5.1)
SODIUM: 143 mmol/L (ref 135–145)
Total Bilirubin: 1.4 mg/dL — ABNORMAL HIGH (ref 0.3–1.2)
Total Protein: 6 g/dL — ABNORMAL LOW (ref 6.5–8.1)

## 2015-07-26 LAB — URINE CULTURE: CULTURE: NO GROWTH

## 2015-07-26 MED ORDER — SODIUM CHLORIDE 0.9 % IV SOLN: INTRAVENOUS | Status: DC

## 2015-07-26 MED ORDER — HYDRALAZINE HCL 20 MG/ML IJ SOLN
5.0000 mg | Freq: Once | INTRAMUSCULAR | Status: AC
  Administered 2015-07-26: 5 mg via INTRAVENOUS
  Filled 2015-07-26: qty 1

## 2015-07-26 NOTE — Progress Notes (Signed)
Initial Nutrition Assessment  DOCUMENTATION CODES:   Non-severe (moderate) malnutrition in context of chronic illness  INTERVENTION:    Diet advancement as able; consider swallow evaluation for safest diet consistency.  NUTRITION DIAGNOSIS:   Inadequate oral intake related to lethargy/confusion as evidenced by per patient/family report.  GOAL:   Patient will meet greater than or equal to 90% of their needs  MONITOR:   Diet advancement, PO intake, Labs, Weight trends, Skin  REASON FOR ASSESSMENT:   Malnutrition Screening Tool, Low Braden   ASSESSMENT:   82 yo female admitted on 4/17 s/p fall with ICH.   Patient with hx of dementia. She has been eating poorly recently per family discussion with physician on admission. They have been trying to increase her fluid intake. She drinks 1 L of water per day.  Currently NPO. Husband is hopeful for diet advancement soon. Labs and medications reviewed.  Diet Order:  Diet NPO time specified  Skin:  Wound (see comment) (blister to heel)  Last BM:  4/18  Height:   Ht Readings from Last 1 Encounters:  07/25/15 5\' 5"  (1.651 m)    Weight:   Wt Readings from Last 1 Encounters:  07/25/15 143 lb 4.8 oz (65 kg)    Ideal Body Weight:  56.8 kg  BMI:  Body mass index is 23.85 kg/(m^2).  Estimated Nutritional Needs:   Kcal:  1400-1600  Protein:  80-90 gm  Fluid:  >/= 1.5 L  EDUCATION NEEDS:   No education needs identified at this time  Molli Barrows, [place], [place], [place] Pager [phone] After Hours Pager [phone]

## 2015-07-26 NOTE — Clinical Documentation Improvement (Signed)
Hospitalist  Please document query responses in the progress notes and discharge summary, not on the CDI BPA form.  Thank you.   Please document if you agree with the Registered Dietician's assessment dated 07/26/15 regarding the diagnosis of "non-severe (moderate) malnutrition:  - moderate malnutrition  - other malnutrition  - other condition  - unable to clinically determine  Clinical information: Registered Dietician Assessment 07/26/15 at 1:45 pm Non-severe (moderate) malnutrition in context of chronic illness  Diet advancement as able; consider swallow evaluation for safest diet consistency.  Inadequate oral intake related to lethargy/confusion as evidenced by per patient/family report.  Skin: Wound (see comment) (blister to heel)  Height 5\' 5"  Weight 143 lbs BMI 23.9  Please exercise your independent, professional judgment when responding. A specific answer is not anticipated or expected.   Thank You, Erling Conte  RN BSN CCDS [phone] Health Information Management [place]

## 2015-07-26 NOTE — Progress Notes (Signed)
Interval History:                                                                                                                      Brooke Watson is an 82 y.o. female patient with multiple intracranial bleed S/P fall. Currently she is lethargic but able to tell me to leave her alone when I try to arouse her.     Past Medical History: Past Medical History  Diagnosis Date  . High cholesterol   . Cognitive decline   . Dementia   . Hypertension   . Thyroid disease   . UTI (urinary tract infection)   . Arthritis     Past Surgical History  Procedure Laterality Date  . Colon surgery      Family History: Family History  Problem Relation Age of Onset  . Dementia Neg Hx     Social History:   reports that she has quit smoking. She has never used smokeless tobacco. She reports that she does not drink alcohol or use illicit drugs.  Allergies:  Allergies  Allergen Reactions  . Ambien [Zolpidem Tartrate]     Disorientation   . Amoxicillin     Unknown.  Has patient had a PCN reaction causing immediate rash, facial/tongue/throat swelling, SOB or lightheadedness with hypotension: unknown Has patient had a PCN reaction causing severe rash involving mucus membranes or skin necrosis: unknown Has patient had a PCN reaction that required hospitalization No Has patient had a PCN reaction occurring within the last 10 years: unknown If all of the above answers are "NO", then may proceed with Cephalosporin use.      Medications:                                                                                                                         Current facility-administered medications:  .  [COMPLETED] 0.9 %  sodium chloride infusion, , Intravenous, STAT, Leonard Schwartz, MD, Last Rate: 100 mL/hr at 07/26/15 0339 .  0.9 %  sodium chloride infusion, , Intravenous, STAT, Allie Bossier, MD .  acetaminophen (TYLENOL) tablet 650 mg, 650 mg, Oral, Q6H PRN **OR** acetaminophen (TYLENOL)  suppository 650 mg, 650 mg, Rectal, Q6H PRN, Lily Kocher, MD .  labetalol (NORMODYNE,TRANDATE) injection 20 mg, 20 mg, Intravenous, Q4H PRN, Lily Kocher, MD, 20 mg at 07/26/15 0755 .  levothyroxine (SYNTHROID, LEVOTHROID) injection 25 mcg, 25 mcg, Intravenous, Daily, Lily Kocher, MD .  sodium chloride flush (NS) 0.9 %  injection 3 mL, 3 mL, Intravenous, Q12H, Lily Kocher, MD, 3 mL at 07/25/15 2200 .  valproate (DEPACON) 250 mg in dextrose 5 % 50 mL IVPB, 250 mg, Intravenous, Q12H, Ram Fuller Mandril, MD, 250 mg at 07/25/15 2200   Neurologic Examination:                                                                                                     Today's Vitals   07/26/15 0500 07/26/15 0600 07/26/15 0700 07/26/15 0751  BP: 141/94 145/96 151/95 144/88  Pulse: 72 73 73 72  Temp:    99.3 F (37.4 C)  TempSrc:    Axillary  Resp: 16 12 14 15   Height:      Weight:      SpO2: 95% 95% 95% 95%    Evaluation of higher integrative functions including: Level of alertness: lethargic but able to tell me to stop when I am examining her. Not oriented Speech: fluent,minimal and difficult to assess aphasia  Test the following cranial nerves: 2-12 grossly intact with right eye S/P surgery but both pupils reactive.  Motor examination:increased tone throughout with postural tremor--withdraws from pain Examination of sensation : intact throughout   Lab Results: Basic Metabolic Panel:  Recent Labs Lab 07/25/15 1254 07/26/15 0420  NA 142 143  K 4.2 3.5  CL 107 107  CO2 26 24  GLUCOSE 116* 116*  BUN 18 12  CREATININE 1.14* 0.98  CALCIUM 9.1 8.5*    Liver Function Tests:  Recent Labs Lab 07/25/15 1254 07/26/15 0420  AST 45* 36  ALT 20 16  ALKPHOS 85 72  BILITOT 1.3* 1.4*  PROT 7.4 6.0*  ALBUMIN 4.1 3.1*   No results for input(s): LIPASE, AMYLASE in the last 168 hours. No results for input(s): AMMONIA in the last 168 hours.  CBC:  Recent Labs Lab 07/25/15 1254   WBC 10.2  NEUTROABS 6.8  HGB 14.9  HCT 44.2  MCV 89.7  PLT 206    Cardiac Enzymes: No results for input(s): CKTOTAL, CKMB, CKMBINDEX, TROPONINI in the last 168 hours.  Lipid Panel: No results for input(s): CHOL, TRIG, HDL, CHOLHDL, VLDL, LDLCALC in the last 168 hours.  CBG: No results for input(s): GLUCAP in the last 168 hours.  Microbiology: Results for orders placed or performed during the hospital encounter of 07/25/15  MRSA PCR Screening     Status: None   Collection Time: 07/25/15  6:57 PM  Result Value Ref Range Status   MRSA by PCR NEGATIVE NEGATIVE Final    Comment:        The GeneXpert MRSA Assay (FDA approved for NASAL specimens only), is one component of a comprehensive MRSA colonization surveillance program. It is not intended to diagnose MRSA infection nor to guide or monitor treatment for MRSA infections.     Imaging: Ct Angio Head W/cm &/or Wo Cm  07/25/2015  CLINICAL DATA:  82 year old hypertensive female with altered mental status after fall 1 day ago. Subsequent encounter. EXAM: CT ANGIOGRAPHY HEAD AND NECK TECHNIQUE: Multidetector CT imaging of the head and  neck was performed using the standard protocol during bolus administration of intravenous contrast. Multiplanar CT image reconstructions and MIPs were obtained to evaluate the vascular anatomy. Carotid stenosis measurements (when applicable) are obtained utilizing NASCET criteria, using the distal internal carotid diameter as the denominator. CONTRAST:  100 cc Isovue 370. COMPARISON:  07/25/2015 head CT.  06/14/2015 brain MR. FINDINGS: CT HEAD Brain: 1.2 x 2 x 1.7 cm hematoma superior aspect of the corpus callosum causing mild mass effect upon the body corpus callosum. Small amounts subarachnoid blood anterior falx level. Subarachnoid hemorrhage posterior right frontal -parietal lobe (central sulcus) measuring up to 1.4 cm. Tiny amount of deep dependent blood within the lateral ventricles suspected.  Tentorial hematoma greater on the right measuring 1.4 x 1 x 1.3 cm. Atrophy. Ventricular prominence may be related to atrophy rather hydrocephalus and without change. No intracranial mass separate from above described findings nor area of abnormal enhancement. Calvarium and skull base: No skull fracture detected. Paranasal sinuses: Clear. Orbits: No acute abnormality.  Post lens replacement CTA NECK Aortic arch: Arch not completely assessed on the current exam. Right carotid system: No significant stenosis of the right carotid artery. Ectatic cervical segment of the right internal carotid artery with prominent folds. Mild ectasia of the distal cervical segment of the right internal carotid artery proximal to the skullbase. Left carotid system: Plaque without significant narrowing of the distal left common carotid artery/bifurcation. Markedly ectatic left internal carotid artery cervical segment. Fibromuscular dysplasia may be present. In addition to irritation areas of narrowing, fusiform dilation of the left internal carotid artery proximal to the skullbase measuring up to 11.8 mm. Vertebral arteries:Both vertebral arteries are patent without evidence of dissection. Vertebral arteries are ectatic at the C2 level bilaterally. Skeleton: No cervical spine fracture detected. Cervical spondylotic changes with kyphosis centered at C5 level with various degrees of spinal stenosis and foraminal narrowing most prominent C5 level. No abnormal prevertebral soft tissue swelling. Other neck: Left lobe of thyroid nodules measuring up to 1.4 cm 2.2 cm can be assessed with elective thyroid ultrasound. CTA HEAD Anterior circulation: Anterior circulation without medium or large size vessel significant stenosis or occlusion. Mild narrowing cavernous segment internal carotid artery bilaterally. The A2 segment of the anterior cerebral arteries are displaced slightly superiorly by the hematoma located superior to the corpus callosum. No  posttraumatic aneurysm or occlusion of the A2 segment of the anterior cerebral artery is noted. No saccular aneurysm detected. Posterior circulation: Mild irregularity narrowing of the distal vertebral arteries and basilar artery without high-grade stenosis. Venous sinuses: Patent. Anatomic variants: Negative. Delayed phase: As above. IMPRESSION: CT HEAD 1.2 x 2 x 1.7 cm hematoma superior aspect of the corpus callosum causing mild mass effect upon the body corpus callosum. Small amount of subarachnoid blood anterior falx level. Subarachnoid hemorrhage posterior right frontal -parietal lobe (central sulcus) measuring up to 1.4 cm. Tiny amount of deep dependent blood within the lateral ventricles suspected. Tentorial hematoma greater on the right measuring 1.4 x 1 x 1.3 cm. Atrophy. Ventricular prominence may be related to atrophy rather hydrocephalus and without change. No intracranial mass separate from above described findings nor area of abnormal enhancement. CTA NECK No significant stenosis of the right carotid artery. Ectatic cervical segment of the right internal carotid artery with prominent folds. Mild ectasia of the distal cervical segment of the right internal carotid artery proximal to the skullbase. Plaque without significant narrowing of the distal left common carotid artery/bifurcation. Markedly ectatic left internal carotid artery cervical  segment. Fibromuscular dysplasia may be present. In addition to regions of narrowing, there is fusiform dilation of the left internal carotid artery proximal to the skullbase measuring up to 11.8 mm. Both vertebral arteries are patent without evidence of dissection. Vertebral arteries are ectatic at the C2 level bilaterally. No cervical spine fracture detected. Cervical spondylotic changes with kyphosis centered at C5 level with various degrees of spinal stenosis and foraminal narrowing most prominent C5 level. No abnormal prevertebral soft tissue swelling. Left lobe  of thyroid nodules measuring up to 1.4 cm 2.2 cm can be assessed with elective thyroid ultrasound. CTA HEAD Anterior circulation without medium or large size vessel significant stenosis or occlusion. Mild narrowing cavernous segment internal carotid artery bilaterally. A2 segment of the anterior cerebral arteries are displaced slightly superiorly by the hematoma located superior to the corpus callosum. No posttraumatic aneurysm or occlusion of the A2 segment of the anterior cerebral artery. No saccular aneurysm detected. Mild irregularity narrowing of the distal vertebral arteries and basilar artery without high-grade stenosis. Electronically Signed   By: Genia Del M.D.   On: 07/25/2015 17:30   Ct Head Wo Contrast  07/25/2015  CLINICAL DATA:  Altered mental status after fall 1 day prior EXAM: CT HEAD WITHOUT CONTRAST TECHNIQUE: Contiguous axial images were obtained from the base of the skull through the vertex without intravenous contrast. COMPARISON:  Head CT June 13, 2015 and brain MRI June 14, 2015 FINDINGS: There is focal hemorrhage arising in the rostrum of the corpus callosum measuring 1.9 x 1.9 cm. Hemorrhage extends superiorly to the right parafalcine region, likely with a degree of parafalcine subdural hematoma. The maximum thickness in this area measures 1.0 cm. There is also tentorial subdural hematoma anteriorly on the right with a maximum thickness of 6 mm. Intraparenchymal hemorrhage is noted at the high frontal-parietal junction measuring 1.3 x 1.2 cm. There is no midline shift. There is moderate generalized atrophy. No mass lesion is seen. There is no midline shift. No epidural fluid collections are identified. There is patchy small vessel disease in the centra semiovale bilaterally. No acute infarct evident. Bony calvarium appears intact. The mastoid air cells are clear. No intraorbital lesions are evident. IMPRESSION: Right parafalcine in anterior right tentorial subdural hematomas.  Parenchymal hemorrhage is seen in the corpus callosum anteriorly as well as at the right frontal -parietal junction near the vertex. No midline shift. Underlying atrophy with small vessel disease in the supratentorial white matter, stable. Critical Value/emergent results were called by telephone at the time of interpretation on 07/25/2015 at 3:50 pm to Dr. Leonard Schwartz , who verbally acknowledged these results. Electronically Signed   By: Lowella Grip III M.D.   On: 07/25/2015 15:50   Ct Angio Neck W/cm &/or Wo/cm  07/25/2015  CLINICAL DATA:  83 year old hypertensive female with altered mental status after fall 1 day ago. Subsequent encounter. EXAM: CT ANGIOGRAPHY HEAD AND NECK TECHNIQUE: Multidetector CT imaging of the head and neck was performed using the standard protocol during bolus administration of intravenous contrast. Multiplanar CT image reconstructions and MIPs were obtained to evaluate the vascular anatomy. Carotid stenosis measurements (when applicable) are obtained utilizing NASCET criteria, using the distal internal carotid diameter as the denominator. CONTRAST:  100 cc Isovue 370. COMPARISON:  07/25/2015 head CT.  06/14/2015 brain MR. FINDINGS: CT HEAD Brain: 1.2 x 2 x 1.7 cm hematoma superior aspect of the corpus callosum causing mild mass effect upon the body corpus callosum. Small amounts subarachnoid blood anterior falx level. Subarachnoid  hemorrhage posterior right frontal -parietal lobe (central sulcus) measuring up to 1.4 cm. Tiny amount of deep dependent blood within the lateral ventricles suspected. Tentorial hematoma greater on the right measuring 1.4 x 1 x 1.3 cm. Atrophy. Ventricular prominence may be related to atrophy rather hydrocephalus and without change. No intracranial mass separate from above described findings nor area of abnormal enhancement. Calvarium and skull base: No skull fracture detected. Paranasal sinuses: Clear. Orbits: No acute abnormality.  Post lens  replacement CTA NECK Aortic arch: Arch not completely assessed on the current exam. Right carotid system: No significant stenosis of the right carotid artery. Ectatic cervical segment of the right internal carotid artery with prominent folds. Mild ectasia of the distal cervical segment of the right internal carotid artery proximal to the skullbase. Left carotid system: Plaque without significant narrowing of the distal left common carotid artery/bifurcation. Markedly ectatic left internal carotid artery cervical segment. Fibromuscular dysplasia may be present. In addition to irritation areas of narrowing, fusiform dilation of the left internal carotid artery proximal to the skullbase measuring up to 11.8 mm. Vertebral arteries:Both vertebral arteries are patent without evidence of dissection. Vertebral arteries are ectatic at the C2 level bilaterally. Skeleton: No cervical spine fracture detected. Cervical spondylotic changes with kyphosis centered at C5 level with various degrees of spinal stenosis and foraminal narrowing most prominent C5 level. No abnormal prevertebral soft tissue swelling. Other neck: Left lobe of thyroid nodules measuring up to 1.4 cm 2.2 cm can be assessed with elective thyroid ultrasound. CTA HEAD Anterior circulation: Anterior circulation without medium or large size vessel significant stenosis or occlusion. Mild narrowing cavernous segment internal carotid artery bilaterally. The A2 segment of the anterior cerebral arteries are displaced slightly superiorly by the hematoma located superior to the corpus callosum. No posttraumatic aneurysm or occlusion of the A2 segment of the anterior cerebral artery is noted. No saccular aneurysm detected. Posterior circulation: Mild irregularity narrowing of the distal vertebral arteries and basilar artery without high-grade stenosis. Venous sinuses: Patent. Anatomic variants: Negative. Delayed phase: As above. IMPRESSION: CT HEAD 1.2 x 2 x 1.7 cm  hematoma superior aspect of the corpus callosum causing mild mass effect upon the body corpus callosum. Small amount of subarachnoid blood anterior falx level. Subarachnoid hemorrhage posterior right frontal -parietal lobe (central sulcus) measuring up to 1.4 cm. Tiny amount of deep dependent blood within the lateral ventricles suspected. Tentorial hematoma greater on the right measuring 1.4 x 1 x 1.3 cm. Atrophy. Ventricular prominence may be related to atrophy rather hydrocephalus and without change. No intracranial mass separate from above described findings nor area of abnormal enhancement. CTA NECK No significant stenosis of the right carotid artery. Ectatic cervical segment of the right internal carotid artery with prominent folds. Mild ectasia of the distal cervical segment of the right internal carotid artery proximal to the skullbase. Plaque without significant narrowing of the distal left common carotid artery/bifurcation. Markedly ectatic left internal carotid artery cervical segment. Fibromuscular dysplasia may be present. In addition to regions of narrowing, there is fusiform dilation of the left internal carotid artery proximal to the skullbase measuring up to 11.8 mm. Both vertebral arteries are patent without evidence of dissection. Vertebral arteries are ectatic at the C2 level bilaterally. No cervical spine fracture detected. Cervical spondylotic changes with kyphosis centered at C5 level with various degrees of spinal stenosis and foraminal narrowing most prominent C5 level. No abnormal prevertebral soft tissue swelling. Left lobe of thyroid nodules measuring up to 1.4 cm 2.2 cm  can be assessed with elective thyroid ultrasound. CTA HEAD Anterior circulation without medium or large size vessel significant stenosis or occlusion. Mild narrowing cavernous segment internal carotid artery bilaterally. A2 segment of the anterior cerebral arteries are displaced slightly superiorly by the hematoma located  superior to the corpus callosum. No posttraumatic aneurysm or occlusion of the A2 segment of the anterior cerebral artery. No saccular aneurysm detected. Mild irregularity narrowing of the distal vertebral arteries and basilar artery without high-grade stenosis. Electronically Signed   By: Genia Del M.D.   On: 07/25/2015 17:30   Mr Hilary Hertz  07/25/2015  CLINICAL DATA:  Initial evaluation for acute intracranial hemorrhage. Evaluate for dural sinus thrombosis. EXAM: MR VENOGRAM the HEAD WITHOUT CONTRAST TECHNIQUE: Angiographic images of the intracranial venous structures were obtained using MRV technique without intravenous contrast. COMPARISON:  None. FINDINGS: Study is somewhat limited by lack of IV contrast. The major dural sinuses appear patent. Specifically, the superior sagittal sinus, right transverse sinus, right sigmoid sinus, and proximal right internal jugular vein are widely patent. The superior sagittal sinus is partially fenestrated. The left transverse sinus is hypoplastic, with the left the distal transverse and sigmoid sinuses largely supplied via the vein of Labbe. Proximal left internal jugular vein is patent. Straight sinus, vein of Galen, and internal cerebral veins appear patent. No evidence for dural sinus thrombosis. No obvious cortical vein thrombosis. Parafalcine hemorrhage and small portal hemorrhages again noted, better evaluated on prior CT. IMPRESSION: Normal MRV.  No evidence for dural sinus thrombosis. Electronically Signed   By: Jeannine Boga M.D.   On: 07/25/2015 23:51   Dg Chest Portable 1 View  07/25/2015  CLINICAL DATA:  Recent fall with difficulty moving EXAM: PORTABLE CHEST 1 VIEW COMPARISON:  06/13/2015 FINDINGS: Cardiac shadow is within normal limits. The thoracic aorta is tortuous but otherwise within normal limits. The lungs are well aerated bilaterally without focal infiltrate. No acute bony abnormality is seen. IMPRESSION: No active disease.  Electronically Signed   By: Inez Catalina M.D.   On: 07/25/2015 13:16    Assessment and plan:   Brooke Watson is an 82 y.o. female patient with fall resulting in multiple intracranial bleeds.  No change in exam this AM . Remains lethargic but this is baseline for her per the family. Will obtain a CT head at 1500 hours. There was question about need for Depakote as she was on that for mood and family felt no significant benefit. At this time I feel this would be helpful in preventing epileptiform activity secondary to the bleeds. Discussed with family.  Will be seen by Dr. Silverio Decamp. Please see his attestation note for A/P for any additional work up recommendations.

## 2015-07-26 NOTE — Telephone Encounter (Signed)
Spoke to daughter. Discussed. Work up in progress inpatient, Will follow.

## 2015-07-26 NOTE — Progress Notes (Signed)
PROGRESS NOTE    Brooke Watson  [id number] DOB: 23-Dec-1933 DOA: 07/25/2015 PCP: Gilford Rile, MD   Outpatient Specialists:    Brief Narrative:  Brooke Watson is a 82 y.o. BF PMHx Dementia, TIA, HTN, HLD, Hypothyroidism.  Who was brought to the ED by her husband for evaluation of progressive lethargy, decreased oral intake, gait disturbance since last Thursday, and recent fall (he denied any head trauma). The patient was admitted in March for dehydration. She had an extensive work-up then that was fairly unremarkable. Per review of the medical record, there have been ongoing discussions about whether dose adjustments needed to be made on some of her neuropsych medications. According to her daughter, they stopped her Haldol on Thursday when her physical therapist first noted marked changes in her gait. No LOC. No documented fever. No N/V/D. She has had decreased urine output. No chest pain or shortness of breath. No abdominal pain. No cought. When she is well, she eats 2 meals daily. The family has been trying to encourage her to increase her fluid intake daily. Currently, she drinks about 1L daily.  Unfortunately, CT scan in the ED shows acute cerebral hemorrhage. The neurohospitalist was called from the ED. We are admitting to the stepdown unit at [facility]; consultation to neurosurgery to be facilitated by neurology, once she has been assessed.    Assessment & Plan:   Active Problems:   Dementia with behavioral disturbance   Acute encephalopathy   Dehydration   Acute cerebral hemorrhage (HCC)   Accelerated hypertension   ICH (intracerebral hemorrhage) (HCC)   Nontraumatic cortical hemorrhage of right cerebral hemisphere [facility])   Other specified hypothyroidism  Acute Encephalopathy/Acute IntraCerebral Hemorrhage  -CTA head; multiple cerebral hemorrhage with mass effect see results below -4/18 repeat CT head; show same multiple cerebral hemorrhages with new  subdural hygroma see results below --NPO until patient passes swallow evaluation --Hold aspirin, NSAIDs --Goal systolic blood pressure [id number] per neurology --Seizure precautions --q 4hr neurochecks --RN stroke swallow screen. --fall precautions -speech; swallow study -spoke with Dr. Joya Salm neurosurgery and he stated he reviewed films and there was no intervention warranted. Stated he would stop by to see patient --care per neurology  Accelerated HTN --labetalol PRN for SBP> 140  Dementia --Valproic Acid level= 18 (low) -valproic acid 250 mg BID; neurology to titrate  Mildly abnormal LFTs noted -resolved --Coags pending  Hypothyroidism --Synthroid IV 25g daily --TSH WNL     DVT prophylaxis: SCD Code Status: full Family Communication: spoke with husband Disposition Plan: per neurology   Consultants:  Dr. Leeroy Cha neurosurgery [facility].   Procedures/Significant Events:  4/17 CT angiogram head/neck;-Brain: 1.2 x 2 x 1.7 cm hematoma superior aspect of the corpus callosum causing mild mass effect upon the body corpus callosum.-Small amounts subarachnoid blood anterior falx level. -Subarachnoid hemorrhage posterior right frontal -parietal lobe (central sulcus) 1.4 cm. -Tentorial hematoma>>Rt  1.4 x 1 x 1.3 cm.  Neck; -Markedly ectatic Lt internal carotid artery cervical segment. Fibromuscular dysplasia may be present. -Regions of narrowing; fusiform dilation of Lt internal carotid artery proximal to the skullbase measuring up to 11.8 mm. -l Cervical spondylotic changes with kyphosis centered at C5 level with various degrees of spinal stenosis and foraminal narrowing most prominent C5 level.  -Left lobe of thyroid nodules measuring up to 1.4 cm 2.2 cm can be assessed with elective thyroid ultrasound. 4/18 CT head without contrast;New small left side subdural hygroma since yesterday. Associated new rightward midline shift of 2-3 mm.-Stable corpus  callosum  hemorrhage. Small volume of right-hemisphere subarachnoid hemorrhage has not significantly changed.-Small volume right tentorial subdural hematoma is stable. -Left posterior scalp hematoma without underlying fracture.   Cultures 4/17 urine NGTD  Antimicrobials:  NA Devices NA   LINES / TUBES:  NA    Continuous Infusions:    Subjective: 4/17 alert, perseveration, follow some commands  Objective: Filed Vitals:   07/26/15 1300 07/26/15 1400 07/26/15 1456 07/26/15 1500  BP: 140/92 148/96  148/96  Pulse: 72 73  74  Temp:   98.4 F (36.9 C)   TempSrc:   Oral   Resp: 13 12  14   Height:      Weight:      SpO2: 95% 96%  96%    Intake/Output Summary (Last 24 hours) at 07/26/15 1813 Last data filed at 07/26/15 1454  Gross per 24 hour  Intake 1151.67 ml  Output    700 ml  Net 451.67 ml   Filed Weights   07/25/15 1844  Weight: 65 kg (143 lb 4.8 oz)    Examination:  General: alert, perseveration, follow some commands,No acute respiratory distress Eyes: negative scleral hemorrhage, negative anisocoria, negative icterus ENT: Negative Runny nose, negative gingival bleeding, Neck:  Negative scars, masses, torticollis, lymphadenopathy, JVD Lungs: Clear to auscultation bilaterally without wheezes or crackles Cardiovascular: Regular rate and rhythm without murmur gallop or rub normal S1 and S2 Abdomen: negative abdominal pain, nondistended, positive soft, bowel sounds, no rebound, no ascites, no appreciable mass Extremities: No significant cyanosis, clubbing, or edema bilateral lower extremities Skin: left heel medial aspect large blister,Negative rashes, lesions, ulcers Psychiatric:  Negative depression, negative anxiety, negative fatigue, negative mania  Central nervous system:  Rightward leaning of head, Cranial nerves II through XII intact, bilateral upper extremity strength 4/5 with resting tremor, bilateral lower extremity strength 0/5, sensation intact upper  extremities, absent lower extremity.unable to perform finger nose finger bilateral, unable to perform quick finger touch bilateral, positive perseveration, positive dysarthria, positive expressive aphasia, negative receptive aphasia.  .     Data Reviewed: Care during the described time interval was provided by me .  I have reviewed this patient's available data, including medical history, events of note, physical examination, and all test results as part of my evaluation. I have personally reviewed and interpreted all radiology studies.  CBC:  Recent Labs Lab 07/25/15 1254  WBC 10.2  NEUTROABS 6.8  HGB 14.9  HCT 44.2  MCV 89.7  PLT 99991111   Basic Metabolic Panel:  Recent Labs Lab 07/25/15 1254 07/26/15 0420  NA 142 143  K 4.2 3.5  CL 107 107  CO2 26 24  GLUCOSE 116* 116*  BUN 18 12  CREATININE 1.14* 0.98  CALCIUM 9.1 8.5*   GFR: Estimated Creatinine Clearance: 41.9 mL/min (by C-G formula based on Cr of 0.98). Liver Function Tests:  Recent Labs Lab 07/25/15 1254 07/26/15 0420  AST 45* 36  ALT 20 16  ALKPHOS 85 72  BILITOT 1.3* 1.4*  PROT 7.4 6.0*  ALBUMIN 4.1 3.1*   No results for input(s): LIPASE, AMYLASE in the last 168 hours. No results for input(s): AMMONIA in the last 168 hours. Coagulation Profile:  Recent Labs Lab 07/25/15 1932  INR 1.12   Cardiac Enzymes: No results for input(s): CKTOTAL, CKMB, CKMBINDEX, TROPONINI in the last 168 hours. BNP (last 3 results) No results for input(s): PROBNP in the last 8760 hours. HbA1C: No results for input(s): HGBA1C in the last 72 hours. CBG: No results for input(s):  GLUCAP in the last 168 hours. Lipid Profile: No results for input(s): CHOL, HDL, LDLCALC, TRIG, CHOLHDL, LDLDIRECT in the last 72 hours. Thyroid Function Tests:  Recent Labs  07/25/15 1932  TSH 4.273   Anemia Panel: No results for input(s): VITAMINB12, FOLATE, FERRITIN, TIBC, IRON, RETICCTPCT in the last 72 hours. Urine analysis:      Component Value Date/Time   COLORURINE AMBER* 07/25/2015 1502   APPEARANCEUR CLEAR 07/25/2015 1502   APPEARANCEUR Clear 09/20/2014 1304   LABSPEC 1.022 07/25/2015 1502   PHURINE 6.0 07/25/2015 1502   GLUCOSEU NEGATIVE 07/25/2015 1502   HGBUR TRACE* 07/25/2015 1502   BILIRUBINUR NEGATIVE 07/25/2015 1502   BILIRUBINUR Negative 09/20/2014 1304   KETONESUR NEGATIVE 07/25/2015 1502   PROTEINUR NEGATIVE 07/25/2015 1502   PROTEINUR Trace 09/20/2014 1304   UROBILINOGEN 1.0 07/09/2014 2200   NITRITE NEGATIVE 07/25/2015 1502   NITRITE Negative 09/20/2014 1304   LEUKOCYTESUR NEGATIVE 07/25/2015 1502   LEUKOCYTESUR Negative 09/20/2014 1304   Sepsis Labs: @LABRCNTIP (procalcitonin:4,lacticidven:4)  ) Recent Results (from the past 240 hour(s))  Urine culture     Status: None   Collection Time: 07/25/15  3:02 PM  Result Value Ref Range Status   Specimen Description URINE, CLEAN CATCH  Final   Special Requests NONE  Final   Culture   Final    NO GROWTH 1 DAY Performed at [facility]    Report Status 07/26/2015 FINAL  Final  MRSA PCR Screening     Status: None   Collection Time: 07/25/15  6:57 PM  Result Value Ref Range Status   MRSA by PCR NEGATIVE NEGATIVE Final    Comment:        The GeneXpert MRSA Assay (FDA approved for NASAL specimens only), is one component of a comprehensive MRSA colonization surveillance program. It is not intended to diagnose MRSA infection nor to guide or monitor treatment for MRSA infections.          Radiology Studies: Ct Angio Head W/cm &/or Wo Cm  07/25/2015  CLINICAL DATA:  82 year old hypertensive female with altered mental status after fall 1 day ago. Subsequent encounter. EXAM: CT ANGIOGRAPHY HEAD AND NECK TECHNIQUE: Multidetector CT imaging of the head and neck was performed using the standard protocol during bolus administration of intravenous contrast. Multiplanar CT image reconstructions and MIPs were obtained to evaluate the  vascular anatomy. Carotid stenosis measurements (when applicable) are obtained utilizing NASCET criteria, using the distal internal carotid diameter as the denominator. CONTRAST:  100 cc Isovue 370. COMPARISON:  07/25/2015 head CT.  06/14/2015 brain MR. FINDINGS: CT HEAD Brain: 1.2 x 2 x 1.7 cm hematoma superior aspect of the corpus callosum causing mild mass effect upon the body corpus callosum. Small amounts subarachnoid blood anterior falx level. Subarachnoid hemorrhage posterior right frontal -parietal lobe (central sulcus) measuring up to 1.4 cm. Tiny amount of deep dependent blood within the lateral ventricles suspected. Tentorial hematoma greater on the right measuring 1.4 x 1 x 1.3 cm. Atrophy. Ventricular prominence may be related to atrophy rather hydrocephalus and without change. No intracranial mass separate from above described findings nor area of abnormal enhancement. Calvarium and skull base: No skull fracture detected. Paranasal sinuses: Clear. Orbits: No acute abnormality.  Post lens replacement CTA NECK Aortic arch: Arch not completely assessed on the current exam. Right carotid system: No significant stenosis of the right carotid artery. Ectatic cervical segment of the right internal carotid artery with prominent folds. Mild ectasia of the distal cervical segment of the right  internal carotid artery proximal to the skullbase. Left carotid system: Plaque without significant narrowing of the distal left common carotid artery/bifurcation. Markedly ectatic left internal carotid artery cervical segment. Fibromuscular dysplasia may be present. In addition to irritation areas of narrowing, fusiform dilation of the left internal carotid artery proximal to the skullbase measuring up to 11.8 mm. Vertebral arteries:Both vertebral arteries are patent without evidence of dissection. Vertebral arteries are ectatic at the C2 level bilaterally. Skeleton: No cervical spine fracture detected. Cervical spondylotic  changes with kyphosis centered at C5 level with various degrees of spinal stenosis and foraminal narrowing most prominent C5 level. No abnormal prevertebral soft tissue swelling. Other neck: Left lobe of thyroid nodules measuring up to 1.4 cm 2.2 cm can be assessed with elective thyroid ultrasound. CTA HEAD Anterior circulation: Anterior circulation without medium or large size vessel significant stenosis or occlusion. Mild narrowing cavernous segment internal carotid artery bilaterally. The A2 segment of the anterior cerebral arteries are displaced slightly superiorly by the hematoma located superior to the corpus callosum. No posttraumatic aneurysm or occlusion of the A2 segment of the anterior cerebral artery is noted. No saccular aneurysm detected. Posterior circulation: Mild irregularity narrowing of the distal vertebral arteries and basilar artery without high-grade stenosis. Venous sinuses: Patent. Anatomic variants: Negative. Delayed phase: As above. IMPRESSION: CT HEAD 1.2 x 2 x 1.7 cm hematoma superior aspect of the corpus callosum causing mild mass effect upon the body corpus callosum. Small amount of subarachnoid blood anterior falx level. Subarachnoid hemorrhage posterior right frontal -parietal lobe (central sulcus) measuring up to 1.4 cm. Tiny amount of deep dependent blood within the lateral ventricles suspected. Tentorial hematoma greater on the right measuring 1.4 x 1 x 1.3 cm. Atrophy. Ventricular prominence may be related to atrophy rather hydrocephalus and without change. No intracranial mass separate from above described findings nor area of abnormal enhancement. CTA NECK No significant stenosis of the right carotid artery. Ectatic cervical segment of the right internal carotid artery with prominent folds. Mild ectasia of the distal cervical segment of the right internal carotid artery proximal to the skullbase. Plaque without significant narrowing of the distal left common carotid  artery/bifurcation. Markedly ectatic left internal carotid artery cervical segment. Fibromuscular dysplasia may be present. In addition to regions of narrowing, there is fusiform dilation of the left internal carotid artery proximal to the skullbase measuring up to 11.8 mm. Both vertebral arteries are patent without evidence of dissection. Vertebral arteries are ectatic at the C2 level bilaterally. No cervical spine fracture detected. Cervical spondylotic changes with kyphosis centered at C5 level with various degrees of spinal stenosis and foraminal narrowing most prominent C5 level. No abnormal prevertebral soft tissue swelling. Left lobe of thyroid nodules measuring up to 1.4 cm 2.2 cm can be assessed with elective thyroid ultrasound. CTA HEAD Anterior circulation without medium or large size vessel significant stenosis or occlusion. Mild narrowing cavernous segment internal carotid artery bilaterally. A2 segment of the anterior cerebral arteries are displaced slightly superiorly by the hematoma located superior to the corpus callosum. No posttraumatic aneurysm or occlusion of the A2 segment of the anterior cerebral artery. No saccular aneurysm detected. Mild irregularity narrowing of the distal vertebral arteries and basilar artery without high-grade stenosis. Electronically Signed   By: Genia Del M.D.   On: 07/25/2015 17:30   Ct Head Wo Contrast  07/26/2015  ADDENDUM REPORT: 07/26/2015 17:13 ADDENDUM: Study discussed by telephone with Dr. Silverio Decamp on 07/26/2015 at 1710 hours. Electronically Signed   By: Lemmie Evens  [place] Crane M.D.   On: 07/26/2015 17:13  07/26/2015  CLINICAL DATA:  82 year old female with acute intracranial hemorrhage, presented with altered mental status after a fall. Hemorrhage in the anterior corpus callosum, subarachnoid and subdural hemorrhage. Initial encounter. EXAM: CT HEAD WITHOUT CONTRAST TECHNIQUE: Contiguous axial images were obtained from the base of the skull through the vertex without  intravenous contrast. COMPARISON:  Head CT 07/25/2015.  CTA head and neck 07/25/2015. FINDINGS: No skull fracture identified. Visualized paranasal sinuses and mastoids are clear. There is a left posterior convexity scalp hematoma measuring up to 5 mm in thickness. Negative visualized orbits soft tissues. Anterior body corpus callosum hyperdense hemorrhage has not significantly changed (series 2, image 17). There is a small volume of subarachnoid hemorrhage in the right sylvian fissure which has slightly increased since yesterday. A small volume of subdural hematoma along the right tentorium is stable. No intraventricular hemorrhage identified. There is a new left side subdural hygroma measuring 5 mm in thickness. Associated 2-3 mm of rightward midline shift is new. Trace subarachnoid hemorrhage at the superior right central sulcus is stable. There may be trace para fall seen subdural or subarachnoid hemorrhage. Generalized cerebral volume loss. Ex vacuo appearing mild ventriculomegaly. No cortically based acute infarct identified. Stable gray-white matter differentiation throughout the brain. IMPRESSION: 1. New small left side subdural hygroma since yesterday. Associated new rightward midline shift of 2-3 mm. 2. Stable corpus callosum hemorrhage. Small volume of right hemisphere subarachnoid hemorrhage has not significantly changed. 3. Small volume right tentorial subdural hematoma is stable. 4. Left posterior scalp hematoma without underlying fracture. Electronically Signed: By: Genevie Ann M.D. On: 07/26/2015 17:02   Ct Head Wo Contrast  07/25/2015  CLINICAL DATA:  Altered mental status after fall 1 day prior EXAM: CT HEAD WITHOUT CONTRAST TECHNIQUE: Contiguous axial images were obtained from the base of the skull through the vertex without intravenous contrast. COMPARISON:  Head CT June 13, 2015 and brain MRI June 14, 2015 FINDINGS: There is focal hemorrhage arising in the rostrum of the corpus callosum measuring  1.9 x 1.9 cm. Hemorrhage extends superiorly to the right parafalcine region, likely with a degree of parafalcine subdural hematoma. The maximum thickness in this area measures 1.0 cm. There is also tentorial subdural hematoma anteriorly on the right with a maximum thickness of 6 mm. Intraparenchymal hemorrhage is noted at the high frontal-parietal junction measuring 1.3 x 1.2 cm. There is no midline shift. There is moderate generalized atrophy. No mass lesion is seen. There is no midline shift. No epidural fluid collections are identified. There is patchy small vessel disease in the centra semiovale bilaterally. No acute infarct evident. Bony calvarium appears intact. The mastoid air cells are clear. No intraorbital lesions are evident. IMPRESSION: Right parafalcine in anterior right tentorial subdural hematomas. Parenchymal hemorrhage is seen in the corpus callosum anteriorly as well as at the right frontal -parietal junction near the vertex. No midline shift. Underlying atrophy with small vessel disease in the supratentorial white matter, stable. Critical Value/emergent results were called by telephone at the time of interpretation on 07/25/2015 at 3:50 pm to Dr. Leonard Schwartz , who verbally acknowledged these results. Electronically Signed   By: Lowella Grip III M.D.   On: 07/25/2015 15:50   Ct Angio Neck W/cm &/or Wo/cm  07/25/2015  CLINICAL DATA:  82 year old hypertensive female with altered mental status after fall 1 day ago. Subsequent encounter. EXAM: CT ANGIOGRAPHY HEAD AND NECK TECHNIQUE: Multidetector CT imaging of the head and neck was  performed using the standard protocol during bolus administration of intravenous contrast. Multiplanar CT image reconstructions and MIPs were obtained to evaluate the vascular anatomy. Carotid stenosis measurements (when applicable) are obtained utilizing NASCET criteria, using the distal internal carotid diameter as the denominator. CONTRAST:  100 cc Isovue 370.  COMPARISON:  07/25/2015 head CT.  06/14/2015 brain MR. FINDINGS: CT HEAD Brain: 1.2 x 2 x 1.7 cm hematoma superior aspect of the corpus callosum causing mild mass effect upon the body corpus callosum. Small amounts subarachnoid blood anterior falx level. Subarachnoid hemorrhage posterior right frontal -parietal lobe (central sulcus) measuring up to 1.4 cm. Tiny amount of deep dependent blood within the lateral ventricles suspected. Tentorial hematoma greater on the right measuring 1.4 x 1 x 1.3 cm. Atrophy. Ventricular prominence may be related to atrophy rather hydrocephalus and without change. No intracranial mass separate from above described findings nor area of abnormal enhancement. Calvarium and skull base: No skull fracture detected. Paranasal sinuses: Clear. Orbits: No acute abnormality.  Post lens replacement CTA NECK Aortic arch: Arch not completely assessed on the current exam. Right carotid system: No significant stenosis of the right carotid artery. Ectatic cervical segment of the right internal carotid artery with prominent folds. Mild ectasia of the distal cervical segment of the right internal carotid artery proximal to the skullbase. Left carotid system: Plaque without significant narrowing of the distal left common carotid artery/bifurcation. Markedly ectatic left internal carotid artery cervical segment. Fibromuscular dysplasia may be present. In addition to irritation areas of narrowing, fusiform dilation of the left internal carotid artery proximal to the skullbase measuring up to 11.8 mm. Vertebral arteries:Both vertebral arteries are patent without evidence of dissection. Vertebral arteries are ectatic at the C2 level bilaterally. Skeleton: No cervical spine fracture detected. Cervical spondylotic changes with kyphosis centered at C5 level with various degrees of spinal stenosis and foraminal narrowing most prominent C5 level. No abnormal prevertebral soft tissue swelling. Other neck: Left  lobe of thyroid nodules measuring up to 1.4 cm 2.2 cm can be assessed with elective thyroid ultrasound. CTA HEAD Anterior circulation: Anterior circulation without medium or large size vessel significant stenosis or occlusion. Mild narrowing cavernous segment internal carotid artery bilaterally. The A2 segment of the anterior cerebral arteries are displaced slightly superiorly by the hematoma located superior to the corpus callosum. No posttraumatic aneurysm or occlusion of the A2 segment of the anterior cerebral artery is noted. No saccular aneurysm detected. Posterior circulation: Mild irregularity narrowing of the distal vertebral arteries and basilar artery without high-grade stenosis. Venous sinuses: Patent. Anatomic variants: Negative. Delayed phase: As above. IMPRESSION: CT HEAD 1.2 x 2 x 1.7 cm hematoma superior aspect of the corpus callosum causing mild mass effect upon the body corpus callosum. Small amount of subarachnoid blood anterior falx level. Subarachnoid hemorrhage posterior right frontal -parietal lobe (central sulcus) measuring up to 1.4 cm. Tiny amount of deep dependent blood within the lateral ventricles suspected. Tentorial hematoma greater on the right measuring 1.4 x 1 x 1.3 cm. Atrophy. Ventricular prominence may be related to atrophy rather hydrocephalus and without change. No intracranial mass separate from above described findings nor area of abnormal enhancement. CTA NECK No significant stenosis of the right carotid artery. Ectatic cervical segment of the right internal carotid artery with prominent folds. Mild ectasia of the distal cervical segment of the right internal carotid artery proximal to the skullbase. Plaque without significant narrowing of the distal left common carotid artery/bifurcation. Markedly ectatic left internal carotid artery cervical segment. Fibromuscular  dysplasia may be present. In addition to regions of narrowing, there is fusiform dilation of the left internal  carotid artery proximal to the skullbase measuring up to 11.8 mm. Both vertebral arteries are patent without evidence of dissection. Vertebral arteries are ectatic at the C2 level bilaterally. No cervical spine fracture detected. Cervical spondylotic changes with kyphosis centered at C5 level with various degrees of spinal stenosis and foraminal narrowing most prominent C5 level. No abnormal prevertebral soft tissue swelling. Left lobe of thyroid nodules measuring up to 1.4 cm 2.2 cm can be assessed with elective thyroid ultrasound. CTA HEAD Anterior circulation without medium or large size vessel significant stenosis or occlusion. Mild narrowing cavernous segment internal carotid artery bilaterally. A2 segment of the anterior cerebral arteries are displaced slightly superiorly by the hematoma located superior to the corpus callosum. No posttraumatic aneurysm or occlusion of the A2 segment of the anterior cerebral artery. No saccular aneurysm detected. Mild irregularity narrowing of the distal vertebral arteries and basilar artery without high-grade stenosis. Electronically Signed   By: Genia Del M.D.   On: 07/25/2015 17:30   Mr Hilary Hertz  07/25/2015  CLINICAL DATA:  Initial evaluation for acute intracranial hemorrhage. Evaluate for dural sinus thrombosis. EXAM: MR VENOGRAM the HEAD WITHOUT CONTRAST TECHNIQUE: Angiographic images of the intracranial venous structures were obtained using MRV technique without intravenous contrast. COMPARISON:  None. FINDINGS: Study is somewhat limited by lack of IV contrast. The major dural sinuses appear patent. Specifically, the superior sagittal sinus, right transverse sinus, right sigmoid sinus, and proximal right internal jugular vein are widely patent. The superior sagittal sinus is partially fenestrated. The left transverse sinus is hypoplastic, with the left the distal transverse and sigmoid sinuses largely supplied via the vein of Labbe. Proximal left internal  jugular vein is patent. Straight sinus, vein of Galen, and internal cerebral veins appear patent. No evidence for dural sinus thrombosis. No obvious cortical vein thrombosis. Parafalcine hemorrhage and small portal hemorrhages again noted, better evaluated on prior CT. IMPRESSION: Normal MRV.  No evidence for dural sinus thrombosis. Electronically Signed   By: Jeannine Boga M.D.   On: 07/25/2015 23:51   Dg Chest Portable 1 View  07/25/2015  CLINICAL DATA:  Recent fall with difficulty moving EXAM: PORTABLE CHEST 1 VIEW COMPARISON:  06/13/2015 FINDINGS: Cardiac shadow is within normal limits. The thoracic aorta is tortuous but otherwise within normal limits. The lungs are well aerated bilaterally without focal infiltrate. No acute bony abnormality is seen. IMPRESSION: No active disease. Electronically Signed   By: Inez Catalina M.D.   On: 07/25/2015 13:16        Scheduled Meds: . sodium chloride   Intravenous STAT  . levothyroxine  25 mcg Intravenous Daily  . sodium chloride flush  3 mL Intravenous Q12H  . valproate sodium  250 mg Intravenous Q12H   Continuous Infusions:    LOS: 1 day    Time spent: 40 minutes    WOODS, Geraldo Docker, MD [facility] Pager [phone]   If 7PM-7AM, please contact night-coverage [email or web address] Password [facility] 07/26/2015, 6:13 PM

## 2015-07-27 DIAGNOSIS — E440 Moderate protein-calorie malnutrition: Secondary | ICD-10-CM | POA: Diagnosis present

## 2015-07-27 MED ORDER — SODIUM CHLORIDE 0.9 % IV SOLN
INTRAVENOUS | Status: DC
  Administered 2015-07-27: 14:00:00 via INTRAVENOUS

## 2015-07-27 MED ORDER — LABETALOL HCL 5 MG/ML IV SOLN
10.0000 mg | Freq: Four times a day (QID) | INTRAVENOUS | Status: DC
  Administered 2015-07-27 – 2015-07-28 (×4): 10 mg via INTRAVENOUS
  Filled 2015-07-27 (×3): qty 4

## 2015-07-27 MED ORDER — KCL IN DEXTROSE-NACL 20-5-0.9 MEQ/L-%-% IV SOLN
INTRAVENOUS | Status: DC
  Administered 2015-07-27 – 2015-08-03 (×6): via INTRAVENOUS
  Filled 2015-07-27 (×9): qty 1000

## 2015-07-27 NOTE — Evaluation (Signed)
Clinical/Bedside Swallow Evaluation Patient Details  Name: Brooke Watson MRN: [id number] Date of Birth: 08-12-1933  Today's Date: 07/27/2015 Time: SLP Start Time (ACUTE ONLY): [id number] SLP Stop Time (ACUTE ONLY): 0935 SLP Time Calculation (min) (ACUTE ONLY): 11 min  Past Medical History:  Past Medical History  Diagnosis Date  . High cholesterol   . Cognitive decline   . Dementia   . Hypertension   . Thyroid disease   . UTI (urinary tract infection)   . Arthritis    Past Surgical History:  Past Surgical History  Procedure Laterality Date  . Colon surgery     HPI:  82 y.o. female patient with h/o dementia who presents with multiple intracranial bleeds s/p fall. Repeat CT shows new small left sided hygroma with right sided 2-3 mm shift.   Assessment / Plan / Recommendation Clinical Impression  Pt keeps her eyes closed throughout evaluation and does not follow most commands given, although she often responds by asking "why". Oral acceptance is limited, suspect in part due to reduced awareness, but pt also actively purses her lips to refuse POs at times. She did consume several bites of applesauce with slow but complete oral transit. Hyolaryngeal movement feels weak upon palpation, but no overt s/s of aspiration were observed. Given overall mentation and performance, recommend that she remain NPO except for meds crushed in puree. Will continue to follow for diet advancement as pt becomes more alert.    Aspiration Risk  Moderate aspiration risk    Diet Recommendation NPO except meds   Medication Administration: Crushed with puree    Other  Recommendations Oral Care Recommendations: Oral care QID   Follow up Recommendations  Skilled Nursing facility    Frequency and Duration min 2x/week  2 weeks       Prognosis Prognosis for Safe Diet Advancement: Good Barriers to Reach Goals: Cognitive deficits      Swallow Study   General HPI: 81 y.o. female patient with h/o dementia  who presents with multiple intracranial bleeds s/p fall. Repeat CT shows new small left sided hygroma with right sided 2-3 mm shift. Type of Study: Bedside Swallow Evaluation Previous Swallow Assessment: none in chart Diet Prior to this Study: NPO Temperature Spikes Noted: No Respiratory Status: Room air History of Recent Intubation: No Behavior/Cognition: Lethargic/Drowsy;Requires cueing Oral Cavity Assessment: Within Functional Limits Oral Care Completed by SLP: Yes Oral Cavity - Dentition: Missing dentition Vision:  (does not open eyes) Self-Feeding Abilities: Needs assist Patient Positioning: Upright in bed Baseline Vocal Quality: Low vocal intensity Volitional Cough: Cognitively unable to elicit Volitional Swallow: Unable to elicit (pt asks "why?" but does not complete)    Oral/Motor/Sensory Function Overall Oral Motor/Sensory Function:  (does not follow commands to assess)   Ice Chips Ice chips: Impaired Presentation: Spoon Oral Phase Impairments: Poor awareness of bolus Pharyngeal Phase Impairments: Suspected delayed Swallow;Decreased hyoid-laryngeal movement   Thin Liquid Thin Liquid: Impaired Presentation: Spoon;Cup Oral Phase Impairments: Poor awareness of bolus;Other (comment) (poor oral acceptance) Pharyngeal  Phase Impairments: Suspected delayed Swallow;Decreased hyoid-laryngeal movement    Nectar Thick Nectar Thick Liquid: Not tested   Honey Thick Honey Thick Liquid: Not tested   Puree Puree: Impaired Presentation: Spoon Oral Phase Functional Implications: Prolonged oral transit Pharyngeal Phase Impairments: Decreased hyoid-laryngeal movement   Solid   GO   Solid: Not tested       Germain Osgood, M.A. CCC-SLP [phone]  Germain Osgood 07/27/2015,9:49 AM

## 2015-07-27 NOTE — Consult Note (Signed)
NAME:  Brooke Watson, Brooke Watson NO.:  [id number]  MEDICAL RECORD NO.:  [id number]  LOCATION:  3S04C                        FACILITY:  [facility]  PHYSICIAN:  Leeroy Cha, M.D.   DATE OF BIRTH:  Aug 11, 1933  DATE OF CONSULTATION: DATE OF DISCHARGE:                                CONSULTATION   HISTORY OF PRESENT ILLNESS:  Brooke Watson is a lady who was admitted to [facility] after she fell.  She has a long history of dementia and she has been almost bedridden.  This lady had a complete workup in the past.  The CT scan done on admission showed that she has corpus callosum bleed with no shift and severe atrophy of the brain.  We were called for evaluation at the request of the neurologist to see what can be done.  The patient has a complete workup including a CT angio, which was essentially negative.  My neurological examination showed that she is able to move all the four extremities.  I cannot communicate with her.  She cannot follow command really well.  She has no stiffness of the neck.  The canal seems to be completely normal to pain, she withdrawal all the four extremities.  The CT scan showed the corpus callosum hematoma.  There is no shift.  There is severe atrophy of the brain.  CLINICAL IMPRESSION:  Dementia.  Traumatic corpus callosum bleed.  RECOMMENDATION:  The patient do not need any type of surgical intervention and the best, follow up will be to repeat the CT scan of the head in the next 1 week.  Will be follow her as needed.  No family member was around to talk about her.          ______________________________ Leeroy Cha, M.D.     EB/MEDQ  D:  07/26/2015  T:  07/26/2015  Job:  [id number]

## 2015-07-27 NOTE — Progress Notes (Signed)
Interval History:                                                                                                                      Brooke Watson is an 82 y.o. female patient with multiple intracranial bleed S/P fall. Currently she is lethargic but able to tell me her name and show me her thumb. She is able to state she has no pain.       Past Medical History: Past Medical History  Diagnosis Date  . High cholesterol   . Cognitive decline   . Dementia   . Hypertension   . Thyroid disease   . UTI (urinary tract infection)   . Arthritis     Past Surgical History  Procedure Laterality Date  . Colon surgery      Family History: Family History  Problem Relation Age of Onset  . Dementia Neg Hx     Social History:   reports that she has quit smoking. She has never used smokeless tobacco. She reports that she does not drink alcohol or use illicit drugs.  Allergies:  Allergies  Allergen Reactions  . Ambien [Zolpidem Tartrate]     Disorientation   . Amoxicillin     Unknown.  Has patient had a PCN reaction causing immediate rash, facial/tongue/throat swelling, SOB or lightheadedness with hypotension: unknown Has patient had a PCN reaction causing severe rash involving mucus membranes or skin necrosis: unknown Has patient had a PCN reaction that required hospitalization No Has patient had a PCN reaction occurring within the last 10 years: unknown If all of the above answers are "NO", then may proceed with Cephalosporin use.      Medications:                                                                                                                         Current facility-administered medications:  .  acetaminophen (TYLENOL) tablet 650 mg, 650 mg, Oral, Q6H PRN **OR** acetaminophen (TYLENOL) suppository 650 mg, 650 mg, Rectal, Q6H PRN, Lily Kocher, MD .  labetalol (NORMODYNE,TRANDATE) injection 20 mg, 20 mg, Intravenous, Q4H PRN, Lily Kocher, MD, 20 mg at 07/27/15  [id number] .  levothyroxine (SYNTHROID, LEVOTHROID) injection 25 mcg, 25 mcg, Intravenous, Daily, Lily Kocher, MD, 25 mcg at 07/26/15 1051 .  sodium chloride flush (NS) 0.9 % injection 3 mL, 3 mL, Intravenous, Q12H, Lily Kocher, MD, 3 mL at 07/26/15 2146 .  valproate (DEPACON) 250 mg in dextrose 5 %  50 mL IVPB, 250 mg, Intravenous, Q12H, Ram Fuller Mandril, MD, 250 mg at 07/26/15 2145   Neurologic Examination:                                                                                                     Today's Vitals   07/27/15 0600 07/27/15 0700 07/27/15 0800 07/27/15 0812  BP: 147/95 147/88 134/76   Pulse: 75 68 70   Temp:    98 F (36.7 C)  TempSrc:    Axillary  Resp: 13 12 13    Height:      Weight:      SpO2: 98% 96% 96%     Evaluation of higher integrative functions including: Level of alertness: lethargic but able to tell me to stop when I am examining her. Tell me her name and that she has no pain Speech: fluent,minimal and difficult to assess aphasia  Test the following cranial nerves: 2-12 grossly intact with right eye S/P surgery but both pupils reactive.  Motor examination:increased tone throughout with postural tremor-- more on the right arm and leg. Will stop after I place my hand on her leg or arm.  Examination of sensation : intact throughout   Lab Results: Basic Metabolic Panel:  Recent Labs Lab 07/25/15 1254 07/26/15 0420  NA 142 143  K 4.2 3.5  CL 107 107  CO2 26 24  GLUCOSE 116* 116*  BUN 18 12  CREATININE 1.14* 0.98  CALCIUM 9.1 8.5*    Liver Function Tests:  Recent Labs Lab 07/25/15 1254 07/26/15 0420  AST 45* 36  ALT 20 16  ALKPHOS 85 72  BILITOT 1.3* 1.4*  PROT 7.4 6.0*  ALBUMIN 4.1 3.1*   No results for input(s): LIPASE, AMYLASE in the last 168 hours. No results for input(s): AMMONIA in the last 168 hours.  CBC:  Recent Labs Lab 07/25/15 1254  WBC 10.2  NEUTROABS 6.8  HGB 14.9  HCT 44.2  MCV 89.7  PLT 206     Cardiac Enzymes: No results for input(s): CKTOTAL, CKMB, CKMBINDEX, TROPONINI in the last 168 hours.  Lipid Panel: No results for input(s): CHOL, TRIG, HDL, CHOLHDL, VLDL, LDLCALC in the last 168 hours.  CBG: No results for input(s): GLUCAP in the last 168 hours.  Microbiology: Results for orders placed or performed during the hospital encounter of 07/25/15  Urine culture     Status: None   Collection Time: 07/25/15  3:02 PM  Result Value Ref Range Status   Specimen Description URINE, CLEAN CATCH  Final   Special Requests NONE  Final   Culture   Final    NO GROWTH 1 DAY Performed at [facility]    Report Status 07/26/2015 FINAL  Final  MRSA PCR Screening     Status: None   Collection Time: 07/25/15  6:57 PM  Result Value Ref Range Status   MRSA by PCR NEGATIVE NEGATIVE Final    Comment:        The GeneXpert MRSA Assay (FDA approved for NASAL specimens only), is one component of a comprehensive  MRSA colonization surveillance program. It is not intended to diagnose MRSA infection nor to guide or monitor treatment for MRSA infections.     Imaging: Ct Angio Head W/cm &/or Wo Cm  07/25/2015  CLINICAL DATA:  82 year old hypertensive female with altered mental status after fall 1 day ago. Subsequent encounter. EXAM: CT ANGIOGRAPHY HEAD AND NECK TECHNIQUE: Multidetector CT imaging of the head and neck was performed using the standard protocol during bolus administration of intravenous contrast. Multiplanar CT image reconstructions and MIPs were obtained to evaluate the vascular anatomy. Carotid stenosis measurements (when applicable) are obtained utilizing NASCET criteria, using the distal internal carotid diameter as the denominator. CONTRAST:  100 cc Isovue 370. COMPARISON:  07/25/2015 head CT.  06/14/2015 brain MR. FINDINGS: CT HEAD Brain: 1.2 x 2 x 1.7 cm hematoma superior aspect of the corpus callosum causing mild mass effect upon the body corpus callosum. Small  amounts subarachnoid blood anterior falx level. Subarachnoid hemorrhage posterior right frontal -parietal lobe (central sulcus) measuring up to 1.4 cm. Tiny amount of deep dependent blood within the lateral ventricles suspected. Tentorial hematoma greater on the right measuring 1.4 x 1 x 1.3 cm. Atrophy. Ventricular prominence may be related to atrophy rather hydrocephalus and without change. No intracranial mass separate from above described findings nor area of abnormal enhancement. Calvarium and skull base: No skull fracture detected. Paranasal sinuses: Clear. Orbits: No acute abnormality.  Post lens replacement CTA NECK Aortic arch: Arch not completely assessed on the current exam. Right carotid system: No significant stenosis of the right carotid artery. Ectatic cervical segment of the right internal carotid artery with prominent folds. Mild ectasia of the distal cervical segment of the right internal carotid artery proximal to the skullbase. Left carotid system: Plaque without significant narrowing of the distal left common carotid artery/bifurcation. Markedly ectatic left internal carotid artery cervical segment. Fibromuscular dysplasia may be present. In addition to irritation areas of narrowing, fusiform dilation of the left internal carotid artery proximal to the skullbase measuring up to 11.8 mm. Vertebral arteries:Both vertebral arteries are patent without evidence of dissection. Vertebral arteries are ectatic at the C2 level bilaterally. Skeleton: No cervical spine fracture detected. Cervical spondylotic changes with kyphosis centered at C5 level with various degrees of spinal stenosis and foraminal narrowing most prominent C5 level. No abnormal prevertebral soft tissue swelling. Other neck: Left lobe of thyroid nodules measuring up to 1.4 cm 2.2 cm can be assessed with elective thyroid ultrasound. CTA HEAD Anterior circulation: Anterior circulation without medium or large size vessel significant  stenosis or occlusion. Mild narrowing cavernous segment internal carotid artery bilaterally. The A2 segment of the anterior cerebral arteries are displaced slightly superiorly by the hematoma located superior to the corpus callosum. No posttraumatic aneurysm or occlusion of the A2 segment of the anterior cerebral artery is noted. No saccular aneurysm detected. Posterior circulation: Mild irregularity narrowing of the distal vertebral arteries and basilar artery without high-grade stenosis. Venous sinuses: Patent. Anatomic variants: Negative. Delayed phase: As above. IMPRESSION: CT HEAD 1.2 x 2 x 1.7 cm hematoma superior aspect of the corpus callosum causing mild mass effect upon the body corpus callosum. Small amount of subarachnoid blood anterior falx level. Subarachnoid hemorrhage posterior right frontal -parietal lobe (central sulcus) measuring up to 1.4 cm. Tiny amount of deep dependent blood within the lateral ventricles suspected. Tentorial hematoma greater on the right measuring 1.4 x 1 x 1.3 cm. Atrophy. Ventricular prominence may be related to atrophy rather hydrocephalus and without change. No intracranial mass  separate from above described findings nor area of abnormal enhancement. CTA NECK No significant stenosis of the right carotid artery. Ectatic cervical segment of the right internal carotid artery with prominent folds. Mild ectasia of the distal cervical segment of the right internal carotid artery proximal to the skullbase. Plaque without significant narrowing of the distal left common carotid artery/bifurcation. Markedly ectatic left internal carotid artery cervical segment. Fibromuscular dysplasia may be present. In addition to regions of narrowing, there is fusiform dilation of the left internal carotid artery proximal to the skullbase measuring up to 11.8 mm. Both vertebral arteries are patent without evidence of dissection. Vertebral arteries are ectatic at the C2 level bilaterally. No  cervical spine fracture detected. Cervical spondylotic changes with kyphosis centered at C5 level with various degrees of spinal stenosis and foraminal narrowing most prominent C5 level. No abnormal prevertebral soft tissue swelling. Left lobe of thyroid nodules measuring up to 1.4 cm 2.2 cm can be assessed with elective thyroid ultrasound. CTA HEAD Anterior circulation without medium or large size vessel significant stenosis or occlusion. Mild narrowing cavernous segment internal carotid artery bilaterally. A2 segment of the anterior cerebral arteries are displaced slightly superiorly by the hematoma located superior to the corpus callosum. No posttraumatic aneurysm or occlusion of the A2 segment of the anterior cerebral artery. No saccular aneurysm detected. Mild irregularity narrowing of the distal vertebral arteries and basilar artery without high-grade stenosis. Electronically Signed   By: Genia Del M.D.   On: 07/25/2015 17:30   Ct Head Wo Contrast  07/26/2015  ADDENDUM REPORT: 07/26/2015 17:13 ADDENDUM: Study discussed by telephone with Dr. Silverio Decamp on 07/26/2015 at 1710 hours. Electronically Signed   By: Genevie Ann M.D.   On: 07/26/2015 17:13  07/26/2015  CLINICAL DATA:  82 year old female with acute intracranial hemorrhage, presented with altered mental status after a fall. Hemorrhage in the anterior corpus callosum, subarachnoid and subdural hemorrhage. Initial encounter. EXAM: CT HEAD WITHOUT CONTRAST TECHNIQUE: Contiguous axial images were obtained from the base of the skull through the vertex without intravenous contrast. COMPARISON:  Head CT 07/25/2015.  CTA head and neck 07/25/2015. FINDINGS: No skull fracture identified. Visualized paranasal sinuses and mastoids are clear. There is a left posterior convexity scalp hematoma measuring up to 5 mm in thickness. Negative visualized orbits soft tissues. Anterior body corpus callosum hyperdense hemorrhage has not significantly changed (series 2, image  17). There is a small volume of subarachnoid hemorrhage in the right sylvian fissure which has slightly increased since yesterday. A small volume of subdural hematoma along the right tentorium is stable. No intraventricular hemorrhage identified. There is a new left side subdural hygroma measuring 5 mm in thickness. Associated 2-3 mm of rightward midline shift is new. Trace subarachnoid hemorrhage at the superior right central sulcus is stable. There may be trace para fall seen subdural or subarachnoid hemorrhage. Generalized cerebral volume loss. Ex vacuo appearing mild ventriculomegaly. No cortically based acute infarct identified. Stable gray-white matter differentiation throughout the brain. IMPRESSION: 1. New small left side subdural hygroma since yesterday. Associated new rightward midline shift of 2-3 mm. 2. Stable corpus callosum hemorrhage. Small volume of right hemisphere subarachnoid hemorrhage has not significantly changed. 3. Small volume right tentorial subdural hematoma is stable. 4. Left posterior scalp hematoma without underlying fracture. Electronically Signed: By: Genevie Ann M.D. On: 07/26/2015 17:02   Ct Head Wo Contrast  07/25/2015  CLINICAL DATA:  Altered mental status after fall 1 day prior EXAM: CT HEAD WITHOUT CONTRAST TECHNIQUE: Contiguous axial  images were obtained from the base of the skull through the vertex without intravenous contrast. COMPARISON:  Head CT June 13, 2015 and brain MRI June 14, 2015 FINDINGS: There is focal hemorrhage arising in the rostrum of the corpus callosum measuring 1.9 x 1.9 cm. Hemorrhage extends superiorly to the right parafalcine region, likely with a degree of parafalcine subdural hematoma. The maximum thickness in this area measures 1.0 cm. There is also tentorial subdural hematoma anteriorly on the right with a maximum thickness of 6 mm. Intraparenchymal hemorrhage is noted at the high frontal-parietal junction measuring 1.3 x 1.2 cm. There is no midline  shift. There is moderate generalized atrophy. No mass lesion is seen. There is no midline shift. No epidural fluid collections are identified. There is patchy small vessel disease in the centra semiovale bilaterally. No acute infarct evident. Bony calvarium appears intact. The mastoid air cells are clear. No intraorbital lesions are evident. IMPRESSION: Right parafalcine in anterior right tentorial subdural hematomas. Parenchymal hemorrhage is seen in the corpus callosum anteriorly as well as at the right frontal -parietal junction near the vertex. No midline shift. Underlying atrophy with small vessel disease in the supratentorial white matter, stable. Critical Value/emergent results were called by telephone at the time of interpretation on 07/25/2015 at 3:50 pm to Dr. Leonard Schwartz , who verbally acknowledged these results. Electronically Signed   By: Lowella Grip III M.D.   On: 07/25/2015 15:50   Ct Angio Neck W/cm &/or Wo/cm  07/25/2015  CLINICAL DATA:  82 year old hypertensive female with altered mental status after fall 1 day ago. Subsequent encounter. EXAM: CT ANGIOGRAPHY HEAD AND NECK TECHNIQUE: Multidetector CT imaging of the head and neck was performed using the standard protocol during bolus administration of intravenous contrast. Multiplanar CT image reconstructions and MIPs were obtained to evaluate the vascular anatomy. Carotid stenosis measurements (when applicable) are obtained utilizing NASCET criteria, using the distal internal carotid diameter as the denominator. CONTRAST:  100 cc Isovue 370. COMPARISON:  07/25/2015 head CT.  06/14/2015 brain MR. FINDINGS: CT HEAD Brain: 1.2 x 2 x 1.7 cm hematoma superior aspect of the corpus callosum causing mild mass effect upon the body corpus callosum. Small amounts subarachnoid blood anterior falx level. Subarachnoid hemorrhage posterior right frontal -parietal lobe (central sulcus) measuring up to 1.4 cm. Tiny amount of deep dependent blood within the  lateral ventricles suspected. Tentorial hematoma greater on the right measuring 1.4 x 1 x 1.3 cm. Atrophy. Ventricular prominence may be related to atrophy rather hydrocephalus and without change. No intracranial mass separate from above described findings nor area of abnormal enhancement. Calvarium and skull base: No skull fracture detected. Paranasal sinuses: Clear. Orbits: No acute abnormality.  Post lens replacement CTA NECK Aortic arch: Arch not completely assessed on the current exam. Right carotid system: No significant stenosis of the right carotid artery. Ectatic cervical segment of the right internal carotid artery with prominent folds. Mild ectasia of the distal cervical segment of the right internal carotid artery proximal to the skullbase. Left carotid system: Plaque without significant narrowing of the distal left common carotid artery/bifurcation. Markedly ectatic left internal carotid artery cervical segment. Fibromuscular dysplasia may be present. In addition to irritation areas of narrowing, fusiform dilation of the left internal carotid artery proximal to the skullbase measuring up to 11.8 mm. Vertebral arteries:Both vertebral arteries are patent without evidence of dissection. Vertebral arteries are ectatic at the C2 level bilaterally. Skeleton: No cervical spine fracture detected. Cervical spondylotic changes with kyphosis centered at  C5 level with various degrees of spinal stenosis and foraminal narrowing most prominent C5 level. No abnormal prevertebral soft tissue swelling. Other neck: Left lobe of thyroid nodules measuring up to 1.4 cm 2.2 cm can be assessed with elective thyroid ultrasound. CTA HEAD Anterior circulation: Anterior circulation without medium or large size vessel significant stenosis or occlusion. Mild narrowing cavernous segment internal carotid artery bilaterally. The A2 segment of the anterior cerebral arteries are displaced slightly superiorly by the hematoma located  superior to the corpus callosum. No posttraumatic aneurysm or occlusion of the A2 segment of the anterior cerebral artery is noted. No saccular aneurysm detected. Posterior circulation: Mild irregularity narrowing of the distal vertebral arteries and basilar artery without high-grade stenosis. Venous sinuses: Patent. Anatomic variants: Negative. Delayed phase: As above. IMPRESSION: CT HEAD 1.2 x 2 x 1.7 cm hematoma superior aspect of the corpus callosum causing mild mass effect upon the body corpus callosum. Small amount of subarachnoid blood anterior falx level. Subarachnoid hemorrhage posterior right frontal -parietal lobe (central sulcus) measuring up to 1.4 cm. Tiny amount of deep dependent blood within the lateral ventricles suspected. Tentorial hematoma greater on the right measuring 1.4 x 1 x 1.3 cm. Atrophy. Ventricular prominence may be related to atrophy rather hydrocephalus and without change. No intracranial mass separate from above described findings nor area of abnormal enhancement. CTA NECK No significant stenosis of the right carotid artery. Ectatic cervical segment of the right internal carotid artery with prominent folds. Mild ectasia of the distal cervical segment of the right internal carotid artery proximal to the skullbase. Plaque without significant narrowing of the distal left common carotid artery/bifurcation. Markedly ectatic left internal carotid artery cervical segment. Fibromuscular dysplasia may be present. In addition to regions of narrowing, there is fusiform dilation of the left internal carotid artery proximal to the skullbase measuring up to 11.8 mm. Both vertebral arteries are patent without evidence of dissection. Vertebral arteries are ectatic at the C2 level bilaterally. No cervical spine fracture detected. Cervical spondylotic changes with kyphosis centered at C5 level with various degrees of spinal stenosis and foraminal narrowing most prominent C5 level. No abnormal  prevertebral soft tissue swelling. Left lobe of thyroid nodules measuring up to 1.4 cm 2.2 cm can be assessed with elective thyroid ultrasound. CTA HEAD Anterior circulation without medium or large size vessel significant stenosis or occlusion. Mild narrowing cavernous segment internal carotid artery bilaterally. A2 segment of the anterior cerebral arteries are displaced slightly superiorly by the hematoma located superior to the corpus callosum. No posttraumatic aneurysm or occlusion of the A2 segment of the anterior cerebral artery. No saccular aneurysm detected. Mild irregularity narrowing of the distal vertebral arteries and basilar artery without high-grade stenosis. Electronically Signed   By: Genia Del M.D.   On: 07/25/2015 17:30   Mr Brooke Watson  07/25/2015  CLINICAL DATA:  Initial evaluation for acute intracranial hemorrhage. Evaluate for dural sinus thrombosis. EXAM: MR VENOGRAM the HEAD WITHOUT CONTRAST TECHNIQUE: Angiographic images of the intracranial venous structures were obtained using MRV technique without intravenous contrast. COMPARISON:  None. FINDINGS: Study is somewhat limited by lack of IV contrast. The major dural sinuses appear patent. Specifically, the superior sagittal sinus, right transverse sinus, right sigmoid sinus, and proximal right internal jugular vein are widely patent. The superior sagittal sinus is partially fenestrated. The left transverse sinus is hypoplastic, with the left the distal transverse and sigmoid sinuses largely supplied via the vein of Labbe. Proximal left internal jugular vein is patent. Straight sinus,  vein of Galen, and internal cerebral veins appear patent. No evidence for dural sinus thrombosis. No obvious cortical vein thrombosis. Parafalcine hemorrhage and small portal hemorrhages again noted, better evaluated on prior CT. IMPRESSION: Normal MRV.  No evidence for dural sinus thrombosis. Electronically Signed   By: Jeannine Boga M.D.   On:  07/25/2015 23:51   Dg Chest Portable 1 View  07/25/2015  CLINICAL DATA:  Recent fall with difficulty moving EXAM: PORTABLE CHEST 1 VIEW COMPARISON:  06/13/2015 FINDINGS: Cardiac shadow is within normal limits. The thoracic aorta is tortuous but otherwise within normal limits. The lungs are well aerated bilaterally without focal infiltrate. No acute bony abnormality is seen. IMPRESSION: No active disease. Electronically Signed   By: Inez Catalina M.D.   On: 07/25/2015 13:16    Assessment and plan:   Brooke Watson is an 82 y.o. female patient with fall resulting in multiple intracranial bleeds.  No change in exam this AM . Remains lethargic but this is baseline for her per the family. Cranial nerve remain intact. Her repeat CT does show a new small left sided hygroma with right sided shift 2-3 mm. Patient remains stable. BP remains stable.    Will be seen by Dr. Silverio Decamp. Please see his attestation note for A/P for any additional work up recommendations.

## 2015-07-27 NOTE — Progress Notes (Signed)
[place] TEAM 1 - Stepdown/ICU TEAM PROGRESS NOTE  Brooke Watson [id number] DOB: [birth year] DOA: 07/25/2015 PCP: Gilford Rile, MD  Admit HPI / Brief Narrative: 82 y.o. F Hx Dementia, TIA, HTN, HLD, and Hypothyroidism who was brought to the ED by her husband for evaluation of progressive lethargy, decreased oral intake, and gait disturbance since a recent fall. The patient was admitted in March for dehydration. She had an extensive work-up then that was fairly unremarkable. There were ongoing discussions about whether dose adjustments needed to be made on some of her neuropsych medications.  CT scan in the ED showed acute cerebral hemorrhage. Neurology and NS were both consulted.    HPI/Subjective: The pt remains more sedate/less interactive than per her baseline per her husband at bedside.  She does not open her eyes during my exam, and does not attempt to interact.    Assessment/Plan:  Acute IntraCerebral Hemorrhage  / Acute Encephalopathy -NS does not feel intervention is warranted -monitoring and medical care per Neurology -no signif improvement in mental status thus far   Accelerated HTN -BP remains above goal of 140 or < - adjust tx and follow   Dementia -family confirms she is conversant at baseline, though orientation waxes/wanes   Mildly abnormal LFTs  -resolved  Hypothyroidism -TSH WNL, though not at goal of 1.0 - cont usual synthroid dose for now   Non-severe (moderate) malnutrition in context of chronic illness  Code Status: FULL Family Communication: spoke w/ husband at bedside, and daughter via phone  Disposition Plan: SDU  Consultants: Neurology NS  Procedures: none  Antibiotics: none  DVT prophylaxis: SCDs  Objective: Blood pressure 149/94, pulse 69, temperature 99.7 F (37.6 C), temperature source Axillary, resp. rate 17, height 5\' 5"  (1.651 m), weight 65 kg (143 lb 4.8 oz), SpO2 97 %.  Intake/Output Summary (Last 24 hours) at  07/27/15 1627 Last data filed at 07/27/15 1600  Gross per 24 hour  Intake 1196.25 ml  Output    950 ml  Net 246.25 ml   Exam: General: No acute respiratory distress Lungs: Clear to auscultation bilaterally without wheezes or crackles Cardiovascular: Regular rate and rhythm without murmur gallop or rub  Abdomen: Nontender, nondistended, soft, bowel sounds positive, no rebound, no ascites, no appreciable mass Extremities: No significant cyanosis, clubbing, or edema bilateral lower extremities  Data Reviewed:  Basic Metabolic Panel:  Recent Labs Lab 07/25/15 1254 07/26/15 0420  NA 142 143  K 4.2 3.5  CL 107 107  CO2 26 24  GLUCOSE 116* 116*  BUN 18 12  CREATININE 1.14* 0.98  CALCIUM 9.1 8.5*    CBC:  Recent Labs Lab 07/25/15 1254  WBC 10.2  NEUTROABS 6.8  HGB 14.9  HCT 44.2  MCV 89.7  PLT 206    Liver Function Tests:  Recent Labs Lab 07/25/15 1254 07/26/15 0420  AST 45* 36  ALT 20 16  ALKPHOS 85 72  BILITOT 1.3* 1.4*  PROT 7.4 6.0*  ALBUMIN 4.1 3.1*    Coags:  Recent Labs Lab 07/25/15 1932  INR 1.12   No results for input(s): APTT in the last 168 hours.  Cardiac Enzymes: No results for input(s): CKTOTAL, CKMB, CKMBINDEX, TROPONINI in the last 168 hours.  CBG: No results for input(s): GLUCAP in the last 168 hours.  Recent Results (from the past 240 hour(s))  Urine culture     Status: None   Collection Time: 07/25/15  3:02 PM  Result Value Ref Range Status   Specimen Description  URINE, CLEAN CATCH  Final   Special Requests NONE  Final   Culture   Final    NO GROWTH 1 DAY Performed at [facility]    Report Status 07/26/2015 FINAL  Final  MRSA PCR Screening     Status: None   Collection Time: 07/25/15  6:57 PM  Result Value Ref Range Status   MRSA by PCR NEGATIVE NEGATIVE Final    Comment:        The GeneXpert MRSA Assay (FDA approved for NASAL specimens only), is one component of a comprehensive MRSA  colonization surveillance program. It is not intended to diagnose MRSA infection nor to guide or monitor treatment for MRSA infections.      Studies:   Recent x-ray studies have been reviewed in detail by the Attending Physician  Scheduled Meds:  Scheduled Meds: . levothyroxine  25 mcg Intravenous Daily  . sodium chloride flush  3 mL Intravenous Q12H  . valproate sodium  250 mg Intravenous Q12H    Time spent on care of this patient: 35 mins   Verenis Nicosia T , MD   [facility] Office  [phone] Pager - Text Page per Shea Evans as per below:  On-Call/Text Page:      [email or web address]      password [email or web address]  If 7PM-7AM, please contact night-coverage [email or web address] Password [email or web address] 07/27/2015, 4:27 PM   LOS: 2 days

## 2015-07-27 NOTE — Care Management Note (Signed)
Case Management Note  Patient Details  Name: Brooke Watson MRN: [id number] Date of Birth: [birth year]  Subjective/Objective:    Patient is from home with spouse, she is active with [facility] for [facility], [place], aide ,OT and Social Work.  NCM spoke with spouse, and daughter , [name] BoBo at [phone].  Patient has a rolling walker and a w/chair at home.  [name] , her daughter is a Designer, jewellery in [place] , she states the goal is for them to get her back home , they really do not want her to go to a facility.  If she has to ,she will hire around the clock help.  NCM will email her the private duty list .  Daughter is hoping for patient to get better in order to receive pt eval to see what patient is really going to be able to do.  Daughter was very pleased with the home health agency , [facility].  NCM will continue to follow for dc needs.                  Action/Plan:   Expected Discharge Date:   (UNKNOWN)               Expected Discharge Plan:  [place]  In-House Referral:  Clinical Social Work  Discharge planning Services  CM Consult  Post Acute Care Choice:    Choice offered to:     DME Arranged:    DME Agency:     HH Arranged:    [place] Agency:     Status of Service:  In process, will continue to follow  Medicare Important Message Given:    Date Medicare IM Given:    Medicare IM give by:    Date Additional Medicare IM Given:    Additional Medicare Important Message give by:     If discussed at [place] of Stay Meetings, dates discussed:    Additional Comments:  Zenon Mayo, RN 07/27/2015, 1:09 PM

## 2015-07-27 NOTE — Clinical Documentation Improvement (Addendum)
Hospitalists and/or Neurology  Please document query responses in the progress notes and discharge summary, not on the CDI BPA form.  Thank you.  The current medical record contains conflicting documentation regarding the patient's "acute intracerebral hemorrhage".  To assist with accurate code assignment, please document if the patient's acute intracerebral hemorrhage is:  - Traumatic secondary to her recent fall   - Nontraumatic and unrelated to her recent fall  - Unable to clinically determine  Clinical Information: "Nontraumatic cortical hemorrhage of right cerebral hemisphere" is documented in the Active Problems list.  "ICH, possibly traumatic but the parafalcine aspect of her [place] is unusual for head trauma" is documented in the Neurology Consult 07/25/15.  "Traumatic corpus callosum bleed" is documented in the Neurosurgery Consult 07/26/15.    Please exercise your independent, professional judgment when responding. A specific answer is not anticipated or expected.   Thank You, Erling Conte  RN BSN CCDS [phone] Health Information Management [place]

## 2015-07-28 LAB — COMPREHENSIVE METABOLIC PANEL
ALK PHOS: 59 U/L (ref 38–126)
ALT: 13 U/L — AB (ref 14–54)
AST: 22 U/L (ref 15–41)
Albumin: 2.5 g/dL — ABNORMAL LOW (ref 3.5–5.0)
Anion gap: 11 (ref 5–15)
BUN: 10 mg/dL (ref 6–20)
CALCIUM: 8.5 mg/dL — AB (ref 8.9–10.3)
CO2: 22 mmol/L (ref 22–32)
CREATININE: 0.88 mg/dL (ref 0.44–1.00)
Chloride: 108 mmol/L (ref 101–111)
GFR calc Af Amer: >60 mL/min (ref >60)
Glucose, Bld: 116 mg/dL — ABNORMAL HIGH (ref 65–99)
Potassium: 3.5 mmol/L (ref 3.5–5.1)
Sodium: 141 mmol/L (ref 135–145)
Total Bilirubin: 0.8 mg/dL (ref 0.3–1.2)
Total Protein: 5.5 g/dL — ABNORMAL LOW (ref 6.5–8.1)

## 2015-07-28 LAB — CBC
HCT: 39.5 % (ref 36.0–46.0)
Hemoglobin: 13 g/dL (ref 12.0–15.0)
MCH: 29.4 pg (ref 26.0–34.0)
MCHC: 32.9 g/dL (ref 30.0–36.0)
MCV: 89.4 fL (ref 78.0–100.0)
PLATELETS: 175 10*3/uL (ref 150–400)
RBC: 4.42 MIL/uL (ref 3.87–5.11)
RDW: 13.9 % (ref 11.5–15.5)
WBC: 8 10*3/uL (ref 4.0–10.5)

## 2015-07-28 LAB — FOLATE: FOLATE: 22.8 ng/mL (ref >5.9)

## 2015-07-28 LAB — VITAMIN B12: Vitamin B-12: 837 pg/mL (ref 180–914)

## 2015-07-28 LAB — AMMONIA: AMMONIA: 72 umol/L — AB (ref 9–35)

## 2015-07-28 MED ORDER — LACTULOSE 10 GM/15ML PO SOLN
30.0000 g | Freq: Two times a day (BID) | ORAL | Status: AC
  Administered 2015-07-29: 30 g via ORAL
  Filled 2015-07-28 (×3): qty 45

## 2015-07-28 MED ORDER — LABETALOL HCL 100 MG PO TABS
100.0000 mg | ORAL_TABLET | Freq: Three times a day (TID) | ORAL | Status: DC
  Administered 2015-07-28 – 2015-08-02 (×8): 100 mg via ORAL
  Filled 2015-07-28 (×10): qty 1

## 2015-07-28 MED ORDER — ARTIFICIAL TEARS OP OINT
TOPICAL_OINTMENT | OPHTHALMIC | Status: DC | PRN
  Administered 2015-07-28 – 2015-08-01 (×2): 1 via OPHTHALMIC
  Filled 2015-07-28: qty 3.5

## 2015-07-28 MED ORDER — ACETAMINOPHEN 500 MG PO TABS
500.0000 mg | ORAL_TABLET | Freq: Four times a day (QID) | ORAL | Status: DC | PRN
  Administered 2015-08-01 – 2015-08-04 (×3): 500 mg via ORAL
  Filled 2015-07-28 (×3): qty 1

## 2015-07-28 MED ORDER — AMLODIPINE BESYLATE 5 MG PO TABS
5.0000 mg | ORAL_TABLET | Freq: Every day | ORAL | Status: DC
  Administered 2015-07-28 – 2015-08-05 (×6): 5 mg via ORAL
  Filled 2015-07-28 (×6): qty 1

## 2015-07-28 MED ORDER — LEVOTHYROXINE SODIUM 50 MCG PO TABS
50.0000 ug | ORAL_TABLET | Freq: Every day | ORAL | Status: DC
  Administered 2015-07-29 – 2015-08-05 (×4): 50 ug via ORAL
  Filled 2015-07-28 (×4): qty 1

## 2015-07-28 MED ORDER — ACETAMINOPHEN 650 MG RE SUPP: 650.0000 mg | Freq: Four times a day (QID) | RECTAL | Status: DC | PRN

## 2015-07-28 MED ORDER — HYDRALAZINE HCL 20 MG/ML IJ SOLN
10.0000 mg | Freq: Once | INTRAMUSCULAR | Status: AC
  Administered 2015-07-28: 10 mg via INTRAVENOUS
  Filled 2015-07-28: qty 1

## 2015-07-28 NOTE — Progress Notes (Signed)
Speech Language Pathology Treatment: Dysphagia  Patient Details Name: Brooke Watson MRN: [id number] DOB: May 23, 1933 Today's Date: 07/28/2015 Time: [id number] SLP Time Calculation (min) (ACUTE ONLY): 19 min  Assessment / Plan / Recommendation Clinical Impression  Pt was upright in her chair this morning and slightly more alert than previous date but fatigues quickly. She initially declines all POs, but starts to take in applesauce with only brief oral holding noted. Hyolaryngeal movement feels weak to palpation but there are no overt signs of aspiration, changes in vocal quality, or additional dry swallows noted. She drank thin liquids by cup and straw without overt signs of aspiration as well, even during periods of impulsivity and/or oral holding. Recommend to initiate Dys 1 diet and thin liquids, to be consumed when pt is alert and orally accepting POs. Discussed with husband at bedside and daughter via phone about current recommendations. MD may wish to consider SLP cognitive-linguistic evaluation as well.   HPI HPI: 82 y.o. female patient with h/o dementia who presents with multiple intracranial bleeds s/p fall. Repeat CT shows new small left sided hygroma with right sided 2-3 mm shift.      SLP Plan  Goals updated     Recommendations  Diet recommendations: Dysphagia 1 (puree);Thin liquid Liquids provided via: Cup;Straw Medication Administration: Crushed with puree Supervision: Staff to assist with self feeding;Full supervision/cueing for compensatory strategies Compensations: Minimize environmental distractions;Slow rate;Small sips/bites Postural Changes and/or Swallow Maneuvers: Seated upright 90 degrees;Upright 30-60 min after meal             Oral Care Recommendations: Oral care BID Follow up Recommendations: Skilled Nursing facility Plan: Goals updated     GO               Brooke Watson, M.A. CCC-SLP [phone]  Brooke Watson 07/28/2015, 10:52 AM

## 2015-07-28 NOTE — Progress Notes (Addendum)
[place] TEAM 1 - Stepdown/ICU TEAM PROGRESS NOTE  Brooke Watson [id number] DOB: 05/27/1933 DOA: 07/25/2015 PCP: Gilford Rile, MD   Admit HPI / Brief Narrative: 82 y.o. F Hx Dementia, TIA, HTN, HLD, and Hypothyroidism who was brought to the ED by her husband for evaluation of progressive lethargy, decreased oral intake, and gait disturbance since a recent fall. The patient was admitted in March for dehydration. She had an extensive work-up then that was fairly unremarkable. There were ongoing discussions about whether dose adjustments needed to be made on some of her neuropsych medications.  CT scan in the ED showed acute cerebral hemorrhage. Neurology and NS were both consulted.    HPI/Subjective: The [pt is more interactive today at the time of my exam.  She will not follow simple commands, but she tells me to leave her alone when I ask her questions.  She has been cleared for a diet by SLP.    Assessment/Plan:  Acute IntraCerebral Hemorrhage / Acute Encephalopathy -unable to determine clinically if associated w/ falls or not  -NS does not feel intervention is warranted -monitoring and medical care per Neurology -mental status now appears to be slowly improving  -hold depakote for now (was on for dementia/behavioral issue prior) in setting of elevated ammonia -B12 and folate normal   Elevated ammonia -likely contributing to acute encephalopathy -can be related to depakote use - stop depakote - tx short term w/ lactulose - follow   Accelerated HTN -BP improving - adjust tx again today and follow   Dementia -family confirms she is conversant at baseline, though orientation waxes/wanes   Mildly abnormal LFTs  -resolved  Hypothyroidism -TSH WNL, though not at goal of 1.0 - cont usual synthroid dose   Non-severe (moderate) malnutrition in context of chronic illness Diet resumed today - follow intake   Code Status: FULL Family Communication: spoke w/ husband at  bedside Disposition Plan: stable for transfer to neuro med bed - cont PT/OT - family determine to take pt home instead of SNF   Consultants: Neurology NS  Procedures: none  Antibiotics: none  DVT prophylaxis: SCDs  Objective: Blood pressure 137/86, pulse 57, temperature 97.6 F (36.4 C), temperature source Oral, resp. rate 14, height 5\' 5"  (1.651 m), weight 65 kg (143 lb 4.8 oz), SpO2 99 %.  Intake/Output Summary (Last 24 hours) at 07/28/15 1529 Last data filed at 07/28/15 1202  Gross per 24 hour  Intake 1263.75 ml  Output   1200 ml  Net  63.75 ml   Exam: General: No acute respiratory distress evident  Lungs: Clear to auscultation bilaterally - no wheezes or crackles Cardiovascular: Regular rate and rhythm without murmur  Abdomen: Nontender, nondistended, soft, bowel sounds positive, no rebound, no ascites, no appreciable mass Extremities: No significant cyanosis, clubbing, edema bilateral lower extremities  Data Reviewed:  Basic Metabolic Panel:  Recent Labs Lab 07/25/15 1254 07/26/15 0420 07/28/15 0510  NA 142 143 141  K 4.2 3.5 3.5  CL 107 107 108  CO2 26 24 22   GLUCOSE 116* 116* 116*  BUN 18 12 10   CREATININE 1.14* 0.98 0.88  CALCIUM 9.1 8.5* 8.5*    CBC:  Recent Labs Lab 07/25/15 1254 07/28/15 0510  WBC 10.2 8.0  NEUTROABS 6.8  --   HGB 14.9 13.0  HCT 44.2 39.5  MCV 89.7 89.4  PLT 206 175    Liver Function Tests:  Recent Labs Lab 07/25/15 1254 07/26/15 0420 07/28/15 0510  AST 45* 36 22  ALT  20 16 13*  ALKPHOS 85 72 59  BILITOT 1.3* 1.4* 0.8  PROT 7.4 6.0* 5.5*  ALBUMIN 4.1 3.1* 2.5*    Coags:  Recent Labs Lab 07/25/15 1932  INR 1.12    Recent Results (from the past 240 hour(s))  Urine culture     Status: None   Collection Time: 07/25/15  3:02 PM  Result Value Ref Range Status   Specimen Description URINE, CLEAN CATCH  Final   Special Requests NONE  Final   Culture   Final    NO GROWTH 1 DAY Performed at [facility]    Report Status 07/26/2015 FINAL  Final  MRSA PCR Screening     Status: None   Collection Time: 07/25/15  6:57 PM  Result Value Ref Range Status   MRSA by PCR NEGATIVE NEGATIVE Final    Comment:        The GeneXpert MRSA Assay (FDA approved for NASAL specimens only), is one component of a comprehensive MRSA colonization surveillance program. It is not intended to diagnose MRSA infection nor to guide or monitor treatment for MRSA infections.      Studies:   Recent x-ray studies have been reviewed in detail by the Attending Physician  Scheduled Meds:  Scheduled Meds: . labetalol  10 mg Intravenous Q6H  . levothyroxine  25 mcg Intravenous Daily  . valproate sodium  250 mg Intravenous Q12H    Time spent on care of this patient: 35 mins   MCCLUNG,JEFFREY T , MD   [facility] Office  [phone] Pager - Text Page per Shea Evans as per below:  On-Call/Text Page:      [email or web address]      password [email or web address]  If 7PM-7AM, please contact night-coverage [email or web address] Password [email or web address] 07/28/2015, 3:29 PM   LOS: 3 days

## 2015-07-28 NOTE — Evaluation (Signed)
Occupational Therapy Evaluation Patient Details Name: Brooke Watson MRN: [id number] DOB: 1933/05/18 Today's Date: 07/28/2015    History of Present Illness 82 y.o. female patient with h/o dementia who presents with multiple intracranial bleeds s/p fall. Repeat CT shows new small left sided hygroma with right sided 2-3 mm shift   Clinical Impression   Pt admitted with above. She demonstrates the below listed deficits and will benefit from continued OT to maximize safety and independence with BADLs.  Pt very lethargic during eval.  Unable to sustain attention to task.  She requires total A for all aspects of ADLs.  She will likely require SNF level rehab at discharge.       Follow Up Recommendations  SNF    Equipment Recommendations  3 in 1 bedside comode;Wheelchair (measurements OT);Hospital bed    Recommendations for Other Services       Precautions / Restrictions Precautions Precautions: Fall Restrictions Weight Bearing Restrictions: No      Mobility Bed Mobility               General bed mobility comments: OOB to chair via lift per nsg  Transfers Overall transfer level: Needs assistance Equipment used: 2 person hand held assist (vian gait belt and chuck pad) Transfers: Sit to/from Stand Sit to Stand: +2 physical assistance;Total assist         General transfer comment: unable to safely attempt     Balance Overall balance assessment: History of Falls                                          ADL Overall ADL's : Needs assistance/impaired Eating/Feeding: Total assistance   Grooming: Wash/dry hands;Wash/dry face;Oral care;Brushing hair;Total assistance;Sitting   Upper Body Bathing: Total assistance;Sitting   Lower Body Bathing: Total assistance;Sitting/lateral leans;Bed level   Upper Body Dressing : Total assistance;Sitting   Lower Body Dressing: Total assistance;Bed level;Sitting/lateral leans   Toilet Transfer: Total  assistance Toilet Transfer Details (indicate cue type and reason): unable to safely attempt  Toileting- Clothing Manipulation and Hygiene: Total assistance;Bed level;Sitting/lateral lean       Functional mobility during ADLs: Total assistance;+2 for physical assistance General ADL Comments: Pt marginally responsive.  Unable to participate in ADL activity      Vision Additional Comments: unable to assess as pt kept eyes closed during eval    Perception Perception Perception Tested?: Yes Perception Deficits: Inattention/neglect Inattention/Neglect: Does not attend to left side of body Spatial deficits: possible Lt negelct/inattention - difficult to determine due to [place] tested?: Deficits Deficits: Ideomotor;Initiation    Pertinent Vitals/Pain Pain Assessment: Faces Faces Pain Scale: Hurts even more Pain Location: neck with ROM  Pain Descriptors / Indicators: Grimacing Pain Intervention(s): Monitored during session     Hand Dominance Right   Extremity/Trunk Assessment Upper Extremity Assessment Upper Extremity Assessment: Generalized weakness;RUE deficits/detail;LUE deficits/detail RUE Deficits / Details: minimal active movement noted Rt UE when asksed to lift her arm  AAROM WFL RUE Coordination: decreased fine motor;decreased gross motor LUE Deficits / Details: Tremor noted.  She does not attempt to spontaneously lift Lt UE when asked to do so.   AAROM WFL LUE Coordination: decreased fine motor;decreased gross motor   Lower Extremity Assessment Lower Extremity Assessment: Defer to PT evaluation   Cervical / Trunk Assessment Cervical / Trunk Assessment: Kyphotic;Other exceptions (head and neck rotated to  Lt and flexed ) Cervical / Trunk Exceptions: head and neck laterally  flexed    Communication Communication Communication: Receptive difficulties;Expressive difficulties   Cognition Arousal/Alertness: Lethargic Behavior During Therapy: Flat  affect Overall Cognitive Status: Impaired/Different from baseline Area of Impairment: Attention;Following commands   Current Attention Level: Focused Memory: Decreased short-term memory Following Commands: Follows one step commands inconsistently;Follows one step commands with increased time       General Comments: Pt lethargic.  Maintained arousal only briefly.  She followed 3 one step commands and answered questions, before falling asleep    General Comments       Exercises Exercises: Other exercises Other Exercises Other Exercises: head and neck ROM to midline - pt indicating pain    Shoulder Instructions      Home Living Family/patient expects to be discharged to:: Unsure Living Arrangements: Spouse/significant other Available Help at Discharge: Family;Available 24 hours/day Type of Home: House       Home Layout: One level     Bathroom Shower/Tub: Teacher, early years/pre: Standard     Home Equipment: Environmental consultant - 4 wheels          Prior Functioning/Environment Level of Independence: Needs assistance  Gait / Transfers Assistance Needed: was ambulatory to bathroom without assist per husband, but did need assist for ambulation later in the evenings ADL's / Homemaking Assistance Needed: family assisted with ADLs        OT Diagnosis: Generalized weakness;Cognitive deficits;Disturbance of vision;Hemiplegia non-dominant side;Apraxia   OT Problem List: Decreased strength;Decreased range of motion;Decreased activity tolerance;Impaired balance (sitting and/or standing);Impaired vision/perception;Decreased cognition;Decreased coordination;Decreased safety awareness;Decreased knowledge of use of DME or AE;Decreased knowledge of precautions;Impaired UE functional use   OT Treatment/Interventions: Self-care/ADL training;Therapeutic exercise;Neuromuscular education;DME and/or AE instruction;Manual therapy;Therapeutic activities;Cognitive  remediation/compensation;Visual/perceptual remediation/compensation;Patient/family education;Balance training    OT Goals(Current goals can be found in the care plan section) Acute Rehab OT Goals Patient Stated Goal: Pt unable to state.  Spouse hopes she will get better  OT Goal Formulation: With family Time For Goal Achievement: 08/11/15 Potential to Achieve Goals: Good ADL Goals Pt Will Perform Eating: with mod assist;sitting Pt Will Perform Grooming: with mod assist;sitting Pt Will Transfer to Toilet: with mod assist;stand pivot transfer;bedside commode Additional ADL Goal #1: Pt will demonstrate sustained attention x 3 mins to familiar ADL task with min A   OT Frequency: Min 2X/week   Barriers to D/C: Decreased caregiver support          Co-evaluation              End of Session Nurse Communication: Mobility status  Activity Tolerance: Patient limited by lethargy Patient left: in chair;with call bell/phone within reach;with family/visitor present   Time: [id number] OT Time Calculation (min): 34 min Charges:  OT General Charges $OT Visit: 1 Procedure OT Evaluation $OT Eval Moderate Complexity: 1 Procedure OT Treatments $Therapeutic Activity: 8-22 mins G-Codes:    Chauntae Hults M Aug 06, 2015, 2:06 PM

## 2015-07-28 NOTE — Evaluation (Signed)
Physical Therapy Evaluation Patient Details Name: Brooke Watson MRN: [id number] DOB: 1934/01/25 Today's Date: 07/28/2015   History of Present Illness  82 y.o. female patient with h/o dementia who presents with multiple intracranial bleeds s/p fall. Repeat CT shows new small left sided hygroma with right sided 2-3 mm shift  Clinical Impression  Patient demonstrates deficits in functional mobility as indicated below. Will need continued skilled PT to address deficits and maximize function. Will see as indicated and progress as tolerated.  Highly recommend ST SNF upon acute discharge to decreased burden of care as patient with history of multiple falls and requiring increasingly more assist.    Follow Up Recommendations SNF;Supervision/Assistance - 24 hour    Equipment Recommendations  Wheelchair (measurements PT);Wheelchair cushion (measurements PT);Hospital bed (hoyer lift )    Recommendations for Other Services       Precautions / Restrictions Precautions Precautions: Fall Restrictions Weight Bearing Restrictions: No      Mobility  Bed Mobility               General bed mobility comments: OOB to chair via lift per nsg  Transfers Overall transfer level: Needs assistance Equipment used: 2 person hand held assist (vian gait belt and chuck pad) Transfers: Sit to/from Stand Sit to Stand: +2 physical assistance;Total assist         General transfer comment: Manual hand placement of hands of arms of chair, Bilateral Knees blocked, face to face wrap around support via gait belt and chuck pad. Max to total assist to come to elevate to standing, unable to facilitate upright positioning. Performed x3  Ambulation/Gait                Stairs            Wheelchair Mobility    Modified Rankin (Stroke Patients Only)       Balance Overall balance assessment: History of Falls                                           Pertinent  Vitals/Pain Pain Assessment: Faces Faces Pain Scale: Hurts a little bit Pain Location: neck Pain Descriptors / Indicators: Discomfort Pain Intervention(s): Monitored during session;Repositioned    Home Living Family/patient expects to be discharged to:: Unsure Living Arrangements: Spouse/significant other Available Help at Discharge: Family;Available 24 hours/day Type of Home: House       Home Layout: One level Home Equipment: Walker - 4 wheels      Prior Function Level of Independence: Needs assistance   Gait / Transfers Assistance Needed: was ambulatory to bathroom without assist per husband, but did need assist for ambulation later in the evenings  ADL's / Homemaking Assistance Needed: family assisted with ADLs        Hand Dominance        Extremity/Trunk Assessment   Upper Extremity Assessment: Difficult to assess due to impaired cognition           Lower Extremity Assessment: Difficult to assess due to impaired cognition      Cervical / Trunk Assessment: Kyphotic (flexed trunk and cervical ROM)  Communication   Communication: Receptive difficulties;Expressive difficulties  Cognition Arousal/Alertness: Lethargic Behavior During Therapy: Flat affect Overall Cognitive Status: Difficult to assess                      General Comments General comments (skin  integrity, edema, etc.): attempted to perform trunk control activity at edge of chair    Exercises        Assessment/Plan    PT Assessment Patient needs continued PT services  PT Diagnosis Difficulty walking;Generalized weakness;Acute pain;Altered mental status   PT Problem List Decreased strength;Decreased activity tolerance;Decreased range of motion;Decreased balance;Decreased mobility;Decreased coordination;Decreased cognition;Decreased safety awareness;Pain  PT Treatment Interventions DME instruction;Gait training;Therapeutic activities;Functional mobility training;Therapeutic  exercise;Balance training;Cognitive remediation;Patient/family education   PT Goals (Current goals can be found in the Care Plan section) Acute Rehab PT Goals Patient Stated Goal: none stated PT Goal Formulation: Patient unable to participate in goal setting Time For Goal Achievement: 08/11/15 Potential to Achieve Goals: Fair    Frequency Min 2X/week   Barriers to discharge        Co-evaluation               End of Session Equipment Utilized During Treatment: Gait belt Activity Tolerance: Patient limited by lethargy Patient left: in chair;with call bell/phone within reach;with family/visitor present Nurse Communication: Mobility status;Need for lift equipment         Time: 1101-1125 PT Time Calculation (min) (ACUTE ONLY): 24 min   Charges:   PT Evaluation $PT Eval Moderate Complexity: 1 Procedure PT Treatments $Therapeutic Activity: 8-22 mins   PT G CodesDuncan Dull 2015/08/07, 12:50 PM Alben Deeds, [place] DPT  [phone]

## 2015-07-28 NOTE — Progress Notes (Signed)
Patient transferred from unit 3S to room 5C20 at this time. Alert and in stable condition.

## 2015-07-28 NOTE — Progress Notes (Signed)
   07/28/15 1710  Vitals  BP (!) 158/91 mmHg  MAP (mmHg) 111  Pulse Rate 73  ECG Heart Rate 72  Resp 15  Oxygen Therapy  SpO2 98 %  labetolol po given

## 2015-07-28 NOTE — Progress Notes (Signed)
Interval History:                                                                                                                      Brooke Watson is an 82 y.o. female patient with multiple intracranial bleed S/P fall. Currently she remains lethargic but able to tell me her name and show me her thumb along with index finger. She is able to state she has no pain and desires to be left alone.       Past Medical History: Past Medical History  Diagnosis Date  . High cholesterol   . Cognitive decline   . Dementia   . Hypertension   . Thyroid disease   . UTI (urinary tract infection)   . Arthritis     Past Surgical History  Procedure Laterality Date  . Colon surgery      Family History: Family History  Problem Relation Age of Onset  . Dementia Neg Hx     Social History:   reports that she has quit smoking. She has never used smokeless tobacco. She reports that she does not drink alcohol or use illicit drugs.  Allergies:  Allergies  Allergen Reactions  . Ambien [Zolpidem Tartrate]     Disorientation   . Amoxicillin     Unknown.  Has patient had a PCN reaction causing immediate rash, facial/tongue/throat swelling, SOB or lightheadedness with hypotension: unknown Has patient had a PCN reaction causing severe rash involving mucus membranes or skin necrosis: unknown Has patient had a PCN reaction that required hospitalization No Has patient had a PCN reaction occurring within the last 10 years: unknown If all of the above answers are "NO", then may proceed with Cephalosporin use.      Medications:                                                                                                                         Current facility-administered medications:  .  acetaminophen (TYLENOL) tablet 650 mg, 650 mg, Oral, Q6H PRN **OR** acetaminophen (TYLENOL) suppository 650 mg, 650 mg, Rectal, Q6H PRN, Lily Kocher, MD .  dextrose 5 % and 0.9 % NaCl with KCl 20 mEq/L infusion,  , Intravenous, Continuous, Cherene Altes, MD, Last Rate: 50 mL/hr at 07/27/15 2000 .  labetalol (NORMODYNE,TRANDATE) injection 10 mg, 10 mg, Intravenous, Q6H, Rhetta Mura Schorr, NP, 10 mg at 07/28/15 0526 .  labetalol (NORMODYNE,TRANDATE) injection 20 mg, 20 mg, Intravenous, Q4H PRN, Lily Kocher,  MD, 20 mg at 07/28/15 0404 .  levothyroxine (SYNTHROID, LEVOTHROID) injection 25 mcg, 25 mcg, Intravenous, Daily, Lily Kocher, MD, 25 mcg at 07/28/15 0950 .  valproate (DEPACON) 250 mg in dextrose 5 % 50 mL IVPB, 250 mg, Intravenous, Q12H, Ram Fuller Mandril, MD, 250 mg at 07/28/15 0953   Neurologic Examination:                                                                                                     Today's Vitals   07/28/15 0430 07/28/15 0455 07/28/15 0615 07/28/15 0801  BP: 156/91 156/87 140/82 146/88  Pulse: 61 62 69 63  Temp:    98.2 F (36.8 C)  TempSrc:    Oral  Resp: 21 18 11 15   Height:      Weight:      SpO2: 97% 97% 96% 97%    Evaluation of higher integrative functions including: Level of alertness: lethargic but able to tell me to stop when I am examining her. Tell me her name and that she has no pain Speech: fluent,minimal and difficult to assess aphasia  Test the following cranial nerves: 2-12 grossly intact with right eye S/P surgery but both pupils reactive.  Motor examination:increased tone throughout with postural tremor-- more on the right arm and leg. Will stop after I place my hand on her leg or arm.  Examination of sensation : intact throughout   Lab Results: Basic Metabolic Panel:  Recent Labs Lab 07/25/15 1254 07/26/15 0420 07/28/15 0510  NA 142 143 141  K 4.2 3.5 3.5  CL 107 107 108  CO2 26 24 22   GLUCOSE 116* 116* 116*  BUN 18 12 10   CREATININE 1.14* 0.98 0.88  CALCIUM 9.1 8.5* 8.5*    Liver Function Tests:  Recent Labs Lab 07/25/15 1254 07/26/15 0420 07/28/15 0510  AST 45* 36 22  ALT 20 16 13*  ALKPHOS 85 72 59   BILITOT 1.3* 1.4* 0.8  PROT 7.4 6.0* 5.5*  ALBUMIN 4.1 3.1* 2.5*   No results for input(s): LIPASE, AMYLASE in the last 168 hours.  Recent Labs Lab 07/28/15 0510  AMMONIA 72*    CBC:  Recent Labs Lab 07/25/15 1254 07/28/15 0510  WBC 10.2 8.0  NEUTROABS 6.8  --   HGB 14.9 13.0  HCT 44.2 39.5  MCV 89.7 89.4  PLT 206 175    Cardiac Enzymes: No results for input(s): CKTOTAL, CKMB, CKMBINDEX, TROPONINI in the last 168 hours.  Lipid Panel: No results for input(s): CHOL, TRIG, HDL, CHOLHDL, VLDL, LDLCALC in the last 168 hours.  CBG: No results for input(s): GLUCAP in the last 168 hours.  Microbiology: Results for orders placed or performed during the hospital encounter of 07/25/15  Urine culture     Status: None   Collection Time: 07/25/15  3:02 PM  Result Value Ref Range Status   Specimen Description URINE, CLEAN CATCH  Final   Special Requests NONE  Final   Culture   Final    NO GROWTH 1 DAY Performed at [facility]  Report Status 07/26/2015 FINAL  Final  MRSA PCR Screening     Status: None   Collection Time: 07/25/15  6:57 PM  Result Value Ref Range Status   MRSA by PCR NEGATIVE NEGATIVE Final    Comment:        The GeneXpert MRSA Assay (FDA approved for NASAL specimens only), is one component of a comprehensive MRSA colonization surveillance program. It is not intended to diagnose MRSA infection nor to guide or monitor treatment for MRSA infections.     Imaging: Ct Angio Head W/cm &/or Wo Cm  07/25/2015  CLINICAL DATA:  82 year old hypertensive female with altered mental status after fall 1 day ago. Subsequent encounter. EXAM: CT ANGIOGRAPHY HEAD AND NECK TECHNIQUE: Multidetector CT imaging of the head and neck was performed using the standard protocol during bolus administration of intravenous contrast. Multiplanar CT image reconstructions and MIPs were obtained to evaluate the vascular anatomy. Carotid stenosis measurements (when  applicable) are obtained utilizing NASCET criteria, using the distal internal carotid diameter as the denominator. CONTRAST:  100 cc Isovue 370. COMPARISON:  07/25/2015 head CT.  06/14/2015 brain MR. FINDINGS: CT HEAD Brain: 1.2 x 2 x 1.7 cm hematoma superior aspect of the corpus callosum causing mild mass effect upon the body corpus callosum. Small amounts subarachnoid blood anterior falx level. Subarachnoid hemorrhage posterior right frontal -parietal lobe (central sulcus) measuring up to 1.4 cm. Tiny amount of deep dependent blood within the lateral ventricles suspected. Tentorial hematoma greater on the right measuring 1.4 x 1 x 1.3 cm. Atrophy. Ventricular prominence may be related to atrophy rather hydrocephalus and without change. No intracranial mass separate from above described findings nor area of abnormal enhancement. Calvarium and skull base: No skull fracture detected. Paranasal sinuses: Clear. Orbits: No acute abnormality.  Post lens replacement CTA NECK Aortic arch: Arch not completely assessed on the current exam. Right carotid system: No significant stenosis of the right carotid artery. Ectatic cervical segment of the right internal carotid artery with prominent folds. Mild ectasia of the distal cervical segment of the right internal carotid artery proximal to the skullbase. Left carotid system: Plaque without significant narrowing of the distal left common carotid artery/bifurcation. Markedly ectatic left internal carotid artery cervical segment. Fibromuscular dysplasia may be present. In addition to irritation areas of narrowing, fusiform dilation of the left internal carotid artery proximal to the skullbase measuring up to 11.8 mm. Vertebral arteries:Both vertebral arteries are patent without evidence of dissection. Vertebral arteries are ectatic at the C2 level bilaterally. Skeleton: No cervical spine fracture detected. Cervical spondylotic changes with kyphosis centered at C5 level with  various degrees of spinal stenosis and foraminal narrowing most prominent C5 level. No abnormal prevertebral soft tissue swelling. Other neck: Left lobe of thyroid nodules measuring up to 1.4 cm 2.2 cm can be assessed with elective thyroid ultrasound. CTA HEAD Anterior circulation: Anterior circulation without medium or large size vessel significant stenosis or occlusion. Mild narrowing cavernous segment internal carotid artery bilaterally. The A2 segment of the anterior cerebral arteries are displaced slightly superiorly by the hematoma located superior to the corpus callosum. No posttraumatic aneurysm or occlusion of the A2 segment of the anterior cerebral artery is noted. No saccular aneurysm detected. Posterior circulation: Mild irregularity narrowing of the distal vertebral arteries and basilar artery without high-grade stenosis. Venous sinuses: Patent. Anatomic variants: Negative. Delayed phase: As above. IMPRESSION: CT HEAD 1.2 x 2 x 1.7 cm hematoma superior aspect of the corpus callosum causing mild mass effect upon the  body corpus callosum. Small amount of subarachnoid blood anterior falx level. Subarachnoid hemorrhage posterior right frontal -parietal lobe (central sulcus) measuring up to 1.4 cm. Tiny amount of deep dependent blood within the lateral ventricles suspected. Tentorial hematoma greater on the right measuring 1.4 x 1 x 1.3 cm. Atrophy. Ventricular prominence may be related to atrophy rather hydrocephalus and without change. No intracranial mass separate from above described findings nor area of abnormal enhancement. CTA NECK No significant stenosis of the right carotid artery. Ectatic cervical segment of the right internal carotid artery with prominent folds. Mild ectasia of the distal cervical segment of the right internal carotid artery proximal to the skullbase. Plaque without significant narrowing of the distal left common carotid artery/bifurcation. Markedly ectatic left internal carotid  artery cervical segment. Fibromuscular dysplasia may be present. In addition to regions of narrowing, there is fusiform dilation of the left internal carotid artery proximal to the skullbase measuring up to 11.8 mm. Both vertebral arteries are patent without evidence of dissection. Vertebral arteries are ectatic at the C2 level bilaterally. No cervical spine fracture detected. Cervical spondylotic changes with kyphosis centered at C5 level with various degrees of spinal stenosis and foraminal narrowing most prominent C5 level. No abnormal prevertebral soft tissue swelling. Left lobe of thyroid nodules measuring up to 1.4 cm 2.2 cm can be assessed with elective thyroid ultrasound. CTA HEAD Anterior circulation without medium or large size vessel significant stenosis or occlusion. Mild narrowing cavernous segment internal carotid artery bilaterally. A2 segment of the anterior cerebral arteries are displaced slightly superiorly by the hematoma located superior to the corpus callosum. No posttraumatic aneurysm or occlusion of the A2 segment of the anterior cerebral artery. No saccular aneurysm detected. Mild irregularity narrowing of the distal vertebral arteries and basilar artery without high-grade stenosis. Electronically Signed   By: Genia Del M.D.   On: 07/25/2015 17:30   Ct Head Wo Contrast  07/26/2015  ADDENDUM REPORT: 07/26/2015 17:13 ADDENDUM: Study discussed by telephone with Dr. Silverio Decamp on 07/26/2015 at 1710 hours. Electronically Signed   By: Genevie Ann M.D.   On: 07/26/2015 17:13  07/26/2015  CLINICAL DATA:  82 year old female with acute intracranial hemorrhage, presented with altered mental status after a fall. Hemorrhage in the anterior corpus callosum, subarachnoid and subdural hemorrhage. Initial encounter. EXAM: CT HEAD WITHOUT CONTRAST TECHNIQUE: Contiguous axial images were obtained from the base of the skull through the vertex without intravenous contrast. COMPARISON:  Head CT 07/25/2015.  CTA  head and neck 07/25/2015. FINDINGS: No skull fracture identified. Visualized paranasal sinuses and mastoids are clear. There is a left posterior convexity scalp hematoma measuring up to 5 mm in thickness. Negative visualized orbits soft tissues. Anterior body corpus callosum hyperdense hemorrhage has not significantly changed (series 2, image 17). There is a small volume of subarachnoid hemorrhage in the right sylvian fissure which has slightly increased since yesterday. A small volume of subdural hematoma along the right tentorium is stable. No intraventricular hemorrhage identified. There is a new left side subdural hygroma measuring 5 mm in thickness. Associated 2-3 mm of rightward midline shift is new. Trace subarachnoid hemorrhage at the superior right central sulcus is stable. There may be trace para fall seen subdural or subarachnoid hemorrhage. Generalized cerebral volume loss. Ex vacuo appearing mild ventriculomegaly. No cortically based acute infarct identified. Stable gray-white matter differentiation throughout the brain. IMPRESSION: 1. New small left side subdural hygroma since yesterday. Associated new rightward midline shift of 2-3 mm. 2. Stable corpus callosum hemorrhage. Small  volume of right hemisphere subarachnoid hemorrhage has not significantly changed. 3. Small volume right tentorial subdural hematoma is stable. 4. Left posterior scalp hematoma without underlying fracture. Electronically Signed: By: Genevie Ann M.D. On: 07/26/2015 17:02   Ct Head Wo Contrast  07/25/2015  CLINICAL DATA:  Altered mental status after fall 1 day prior EXAM: CT HEAD WITHOUT CONTRAST TECHNIQUE: Contiguous axial images were obtained from the base of the skull through the vertex without intravenous contrast. COMPARISON:  Head CT June 13, 2015 and brain MRI June 14, 2015 FINDINGS: There is focal hemorrhage arising in the rostrum of the corpus callosum measuring 1.9 x 1.9 cm. Hemorrhage extends superiorly to the right  parafalcine region, likely with a degree of parafalcine subdural hematoma. The maximum thickness in this area measures 1.0 cm. There is also tentorial subdural hematoma anteriorly on the right with a maximum thickness of 6 mm. Intraparenchymal hemorrhage is noted at the high frontal-parietal junction measuring 1.3 x 1.2 cm. There is no midline shift. There is moderate generalized atrophy. No mass lesion is seen. There is no midline shift. No epidural fluid collections are identified. There is patchy small vessel disease in the centra semiovale bilaterally. No acute infarct evident. Bony calvarium appears intact. The mastoid air cells are clear. No intraorbital lesions are evident. IMPRESSION: Right parafalcine in anterior right tentorial subdural hematomas. Parenchymal hemorrhage is seen in the corpus callosum anteriorly as well as at the right frontal -parietal junction near the vertex. No midline shift. Underlying atrophy with small vessel disease in the supratentorial white matter, stable. Critical Value/emergent results were called by telephone at the time of interpretation on 07/25/2015 at 3:50 pm to Dr. Leonard Schwartz , who verbally acknowledged these results. Electronically Signed   By: Lowella Grip III M.D.   On: 07/25/2015 15:50   Ct Angio Neck W/cm &/or Wo/cm  07/25/2015  CLINICAL DATA:  81 year old hypertensive female with altered mental status after fall 1 day ago. Subsequent encounter. EXAM: CT ANGIOGRAPHY HEAD AND NECK TECHNIQUE: Multidetector CT imaging of the head and neck was performed using the standard protocol during bolus administration of intravenous contrast. Multiplanar CT image reconstructions and MIPs were obtained to evaluate the vascular anatomy. Carotid stenosis measurements (when applicable) are obtained utilizing NASCET criteria, using the distal internal carotid diameter as the denominator. CONTRAST:  100 cc Isovue 370. COMPARISON:  07/25/2015 head CT.  06/14/2015 brain MR.  FINDINGS: CT HEAD Brain: 1.2 x 2 x 1.7 cm hematoma superior aspect of the corpus callosum causing mild mass effect upon the body corpus callosum. Small amounts subarachnoid blood anterior falx level. Subarachnoid hemorrhage posterior right frontal -parietal lobe (central sulcus) measuring up to 1.4 cm. Tiny amount of deep dependent blood within the lateral ventricles suspected. Tentorial hematoma greater on the right measuring 1.4 x 1 x 1.3 cm. Atrophy. Ventricular prominence may be related to atrophy rather hydrocephalus and without change. No intracranial mass separate from above described findings nor area of abnormal enhancement. Calvarium and skull base: No skull fracture detected. Paranasal sinuses: Clear. Orbits: No acute abnormality.  Post lens replacement CTA NECK Aortic arch: Arch not completely assessed on the current exam. Right carotid system: No significant stenosis of the right carotid artery. Ectatic cervical segment of the right internal carotid artery with prominent folds. Mild ectasia of the distal cervical segment of the right internal carotid artery proximal to the skullbase. Left carotid system: Plaque without significant narrowing of the distal left common carotid artery/bifurcation. Markedly ectatic left internal  carotid artery cervical segment. Fibromuscular dysplasia may be present. In addition to irritation areas of narrowing, fusiform dilation of the left internal carotid artery proximal to the skullbase measuring up to 11.8 mm. Vertebral arteries:Both vertebral arteries are patent without evidence of dissection. Vertebral arteries are ectatic at the C2 level bilaterally. Skeleton: No cervical spine fracture detected. Cervical spondylotic changes with kyphosis centered at C5 level with various degrees of spinal stenosis and foraminal narrowing most prominent C5 level. No abnormal prevertebral soft tissue swelling. Other neck: Left lobe of thyroid nodules measuring up to 1.4 cm 2.2 cm can  be assessed with elective thyroid ultrasound. CTA HEAD Anterior circulation: Anterior circulation without medium or large size vessel significant stenosis or occlusion. Mild narrowing cavernous segment internal carotid artery bilaterally. The A2 segment of the anterior cerebral arteries are displaced slightly superiorly by the hematoma located superior to the corpus callosum. No posttraumatic aneurysm or occlusion of the A2 segment of the anterior cerebral artery is noted. No saccular aneurysm detected. Posterior circulation: Mild irregularity narrowing of the distal vertebral arteries and basilar artery without high-grade stenosis. Venous sinuses: Patent. Anatomic variants: Negative. Delayed phase: As above. IMPRESSION: CT HEAD 1.2 x 2 x 1.7 cm hematoma superior aspect of the corpus callosum causing mild mass effect upon the body corpus callosum. Small amount of subarachnoid blood anterior falx level. Subarachnoid hemorrhage posterior right frontal -parietal lobe (central sulcus) measuring up to 1.4 cm. Tiny amount of deep dependent blood within the lateral ventricles suspected. Tentorial hematoma greater on the right measuring 1.4 x 1 x 1.3 cm. Atrophy. Ventricular prominence may be related to atrophy rather hydrocephalus and without change. No intracranial mass separate from above described findings nor area of abnormal enhancement. CTA NECK No significant stenosis of the right carotid artery. Ectatic cervical segment of the right internal carotid artery with prominent folds. Mild ectasia of the distal cervical segment of the right internal carotid artery proximal to the skullbase. Plaque without significant narrowing of the distal left common carotid artery/bifurcation. Markedly ectatic left internal carotid artery cervical segment. Fibromuscular dysplasia may be present. In addition to regions of narrowing, there is fusiform dilation of the left internal carotid artery proximal to the skullbase measuring up to  11.8 mm. Both vertebral arteries are patent without evidence of dissection. Vertebral arteries are ectatic at the C2 level bilaterally. No cervical spine fracture detected. Cervical spondylotic changes with kyphosis centered at C5 level with various degrees of spinal stenosis and foraminal narrowing most prominent C5 level. No abnormal prevertebral soft tissue swelling. Left lobe of thyroid nodules measuring up to 1.4 cm 2.2 cm can be assessed with elective thyroid ultrasound. CTA HEAD Anterior circulation without medium or large size vessel significant stenosis or occlusion. Mild narrowing cavernous segment internal carotid artery bilaterally. A2 segment of the anterior cerebral arteries are displaced slightly superiorly by the hematoma located superior to the corpus callosum. No posttraumatic aneurysm or occlusion of the A2 segment of the anterior cerebral artery. No saccular aneurysm detected. Mild irregularity narrowing of the distal vertebral arteries and basilar artery without high-grade stenosis. Electronically Signed   By: Genia Del M.D.   On: 07/25/2015 17:30   Mr Hilary Hertz  07/25/2015  CLINICAL DATA:  Initial evaluation for acute intracranial hemorrhage. Evaluate for dural sinus thrombosis. EXAM: MR VENOGRAM the HEAD WITHOUT CONTRAST TECHNIQUE: Angiographic images of the intracranial venous structures were obtained using MRV technique without intravenous contrast. COMPARISON:  None. FINDINGS: Study is somewhat limited by lack of IV  contrast. The major dural sinuses appear patent. Specifically, the superior sagittal sinus, right transverse sinus, right sigmoid sinus, and proximal right internal jugular vein are widely patent. The superior sagittal sinus is partially fenestrated. The left transverse sinus is hypoplastic, with the left the distal transverse and sigmoid sinuses largely supplied via the vein of Labbe. Proximal left internal jugular vein is patent. Straight sinus, vein of Galen, and  internal cerebral veins appear patent. No evidence for dural sinus thrombosis. No obvious cortical vein thrombosis. Parafalcine hemorrhage and small portal hemorrhages again noted, better evaluated on prior CT. IMPRESSION: Normal MRV.  No evidence for dural sinus thrombosis. Electronically Signed   By: Jeannine Boga M.D.   On: 07/25/2015 23:51   Dg Chest Portable 1 View  07/25/2015  CLINICAL DATA:  Recent fall with difficulty moving EXAM: PORTABLE CHEST 1 VIEW COMPARISON:  06/13/2015 FINDINGS: Cardiac shadow is within normal limits. The thoracic aorta is tortuous but otherwise within normal limits. The lungs are well aerated bilaterally without focal infiltrate. No acute bony abnormality is seen. IMPRESSION: No active disease. Electronically Signed   By: Inez Catalina M.D.   On: 07/25/2015 13:16    Assessment and plan:   Brooke Watson is an 82 y.o. female patient with fall resulting in multiple intracranial bleeds.  No change in exam this AM . Remains lethargic but this is baseline for her per the family. Cranial nerve remain intact. Patient remains stable. BP remains stable.    Will be seen by Dr. Silverio Decamp. Please see his attestation note for A/P for any additional work up recommendations.

## 2015-07-28 NOTE — Progress Notes (Signed)
Pt transferred to 5C20. Gave report to [facility]. Pt stable and personal belongings taken. Husband at bedside.

## 2015-07-29 ENCOUNTER — Inpatient Hospital Stay (HOSPITAL_COMMUNITY): Payer: Medicare Other

## 2015-07-29 DIAGNOSIS — S065XAA Traumatic subdural hemorrhage with loss of consciousness status unknown, initial encounter: Secondary | ICD-10-CM | POA: Diagnosis present

## 2015-07-29 DIAGNOSIS — S065X9A Traumatic subdural hemorrhage with loss of consciousness of unspecified duration, initial encounter: Secondary | ICD-10-CM | POA: Diagnosis present

## 2015-07-29 DIAGNOSIS — G20 Parkinson's disease: Secondary | ICD-10-CM | POA: Insufficient documentation

## 2015-07-29 LAB — MAGNESIUM: MAGNESIUM: 2.1 mg/dL (ref 1.7–2.4)

## 2015-07-29 LAB — BASIC METABOLIC PANEL
Anion gap: 11 (ref 5–15)
BUN: 14 mg/dL (ref 6–20)
CO2: 22 mmol/L (ref 22–32)
CREATININE: 0.79 mg/dL (ref 0.44–1.00)
Calcium: 8.6 mg/dL — ABNORMAL LOW (ref 8.9–10.3)
Chloride: 107 mmol/L (ref 101–111)
GFR calc Af Amer: >60 mL/min (ref >60)
Glucose, Bld: 113 mg/dL — ABNORMAL HIGH (ref 65–99)
Potassium: 3.6 mmol/L (ref 3.5–5.1)
SODIUM: 140 mmol/L (ref 135–145)

## 2015-07-29 LAB — AMMONIA: AMMONIA: 31 umol/L (ref 9–35)

## 2015-07-29 MED ORDER — CARBIDOPA-LEVODOPA 10-100 MG PO TABS
1.0000 | ORAL_TABLET | Freq: Three times a day (TID) | ORAL | Status: DC
  Filled 2015-07-29 (×2): qty 1

## 2015-07-29 MED ORDER — ENSURE ENLIVE PO LIQD
237.0000 mL | Freq: Two times a day (BID) | ORAL | Status: DC
  Administered 2015-07-29: 237 mL via ORAL
  Filled 2015-07-29 (×9): qty 237

## 2015-07-29 NOTE — Progress Notes (Signed)
Speech Language Pathology Treatment: Dysphagia  Patient Details Name: Brooke Watson MRN: [id number] DOB: 1933-12-28 Today's Date: 07/29/2015 Time: [id number] SLP Time Calculation (min) (ACUTE ONLY): 24 min  Assessment / Plan / Recommendation Clinical Impression  Pt seen in followup for diet tolerance and possible upgrade. Nursing staff report pt has been refusing all PO. Pt was initially verbally agreeable to PO and responsive to conversation. Pt consumed 2 1/2 t-sized bites of puree and 1 sip of thin, then refused all other PO offered. Pt also stopped verbally responding once PO was offered, although she remained fully alert. Recommend: Proceed with current diet/aspiration precautions as pt willing to take PO.   HPI HPI: 82 y.o. female patient with h/o dementia who presents with multiple intracranial bleeds s/p fall. Repeat CT shows new small left sided hygroma with right sided 2-3 mm shift.      SLP Plan  Continue with current plan of care     Recommendations  Diet recommendations: Dysphagia 1 (puree);Thin liquid Liquids provided via: Cup;Straw Medication Administration: Crushed with puree Supervision: Staff to assist with self feeding;Full supervision/cueing for compensatory strategies Compensations: Minimize environmental distractions;Slow rate;Small sips/bites Postural Changes and/or Swallow Maneuvers: Seated upright 90 degrees;Upright 30-60 min after meal             Oral Care Recommendations: Oral care BID Follow up Recommendations: Skilled Nursing facility Plan: Continue with current plan of care     [place] MA, CCC-SLP 07/29/2015, 8:57 AM

## 2015-07-29 NOTE — Care Management Important Message (Signed)
Important Message  Patient Details  Name: Brooke Watson MRN: [id number] Date of Birth: [birth year]   Medicare Important Message Given:  Yes    Tonio Seider P [place] 07/29/2015, 1:28 PM

## 2015-07-29 NOTE — Progress Notes (Addendum)
Brooke Watson [id number] DOB: May 28, 1933 DOA: 07/25/2015 PCP: Gilford Rile, MD   Admit HPI / Brief Narrative: 82 y.o. F Hx Dementia, TIA, HTN, HLD, and Hypothyroidism who was brought to the ED by her husband for evaluation of progressive lethargy, decreased oral intake, and gait disturbance since a recent fall. The patient was admitted in March for dehydration. She had an extensive work-up then that was fairly unremarkable. There were ongoing discussions about whether dose adjustments needed to be made on some of her neuropsych medications.  CT scan in the ED showed acute cerebral hemorrhage,multiple intracranial bleed S/P fall. Neurology and NS were both consulted.    HPI/Subjective:  not follow simple commands,not waking up  but she tells me to leave her alone when I ask her questions.  She has been cleared for a diet by SLP.    Assessment/Plan:  Acute IntraCerebral Hemorrhage / Acute Encephalopathy -unable to determine clinically if associated w/ falls or not  Neurosurgery does not feel intervention is warranted -monitoring and medical care per Neurology, discussed with Dr. Silverio Decamp regarding patient's prognosis, he recommended EEG for further evaluation -mental status now appears to be slowly improving  -hold depakote for now (was on for dementia/behavioral issue prior) in setting of elevated ammonia -B12 and folate normal  Remains lethargic , remains NPO given minimal alertness Await results of EEG, prognosis per neurology Palliative care consultation based on patient's prognosis  Elevated ammonia -likely contributing to acute encephalopathy, 72> 31 -can be related to depakote use - stop depakote - tx short term w/ lactulose - follow   Accelerated HTN -BP improving - adjust tx again today and follow   Dementia -family confirms she is conversant at baseline, though orientation waxes/wanes  Diet recommendations: Dysphagia 1 (puree);Thin liquid  Mildly abnormal  LFTs  -resolved  Hypothyroidism -TSH WNL, though not at goal of 1.0 - cont usual synthroid dose   Non-severe (moderate) malnutrition in context of chronic illness Diet resumed today - however patient not following commands  Code Status: FULL Family Communication: spoke w/ husband at bedside Disposition Plan: Will need SNF, - family determine to take pt home instead of SNF, social work consult for further evaluation, prognostic evaluation by neurology, palliative care consultation ordered   Consultants: Neurology NS Palliative care  Procedures: none  Antibiotics: none  DVT prophylaxis: SCDs  Objective: Blood pressure 152/84, pulse 62, temperature 98.4 F (36.9 C), temperature source Oral, resp. rate 18, height 5\' 5"  (1.651 m), weight 65 kg (143 lb 4.8 oz), SpO2 96 %.  Intake/Output Summary (Last 24 hours) at 07/29/15 0927 Last data filed at 07/28/15 1700  Gross per 24 hour  Intake  586.5 ml  Output    250 ml  Net  336.5 ml   Exam: General: No acute respiratory distress evident  Lungs: Clear to auscultation bilaterally - no wheezes or crackles Cardiovascular: Regular rate and rhythm without murmur  Abdomen: Nontender, nondistended, soft, bowel sounds positive, no rebound, no ascites, no appreciable mass Extremities: No significant cyanosis, clubbing, edema bilateral lower extremities  Data Reviewed:  Basic Metabolic Panel:  Recent Labs Lab 07/25/15 1254 07/26/15 0420 07/28/15 0510 07/29/15 0646  NA 142 143 141 140  K 4.2 3.5 3.5 3.6  CL 107 107 108 107  CO2 26 24 22 22   GLUCOSE 116* 116* 116* 113*  BUN 18 12 10 14   CREATININE 1.14* 0.98 0.88 0.79  CALCIUM 9.1 8.5* 8.5* 8.6*    CBC:  Recent Labs Lab 07/25/15  1254 07/28/15 0510  WBC 10.2 8.0  NEUTROABS 6.8  --   HGB 14.9 13.0  HCT 44.2 39.5  MCV 89.7 89.4  PLT 206 175    Liver Function Tests:  Recent Labs Lab 07/25/15 1254 07/26/15 0420 07/28/15 0510  AST 45* 36 22  ALT 20 16 13*    ALKPHOS 85 72 59  BILITOT 1.3* 1.4* 0.8  PROT 7.4 6.0* 5.5*  ALBUMIN 4.1 3.1* 2.5*    Coags:  Recent Labs Lab 07/25/15 1932  INR 1.12    Recent Results (from the past 240 hour(s))  Urine culture     Status: None   Collection Time: 07/25/15  3:02 PM  Result Value Ref Range Status   Specimen Description URINE, CLEAN CATCH  Final   Special Requests NONE  Final   Culture   Final    NO GROWTH 1 DAY Performed at [facility]    Report Status 07/26/2015 FINAL  Final  MRSA PCR Screening     Status: None   Collection Time: 07/25/15  6:57 PM  Result Value Ref Range Status   MRSA by PCR NEGATIVE NEGATIVE Final    Comment:        The GeneXpert MRSA Assay (FDA approved for NASAL specimens only), is one component of a comprehensive MRSA colonization surveillance program. It is not intended to diagnose MRSA infection nor to guide or monitor treatment for MRSA infections.      Studies:   Recent x-ray studies have been reviewed in detail by the Attending Physician  Scheduled Meds:  Scheduled Meds: . amLODipine  5 mg Oral Daily  . labetalol  100 mg Oral TID  . lactulose  30 g Oral BID  . levothyroxine  50 mcg Oral QAC breakfast    Time spent on care of this patient: 27 mins   Reyne Dumas , MD   [facility] Office  [phone] Pager - Text Page per Shea Evans as per below:  On-Call/Text Page:      [email or web address]      password [email or web address]  If 7PM-7AM, please contact night-coverage [email or web address] Password [email or web address] 07/29/2015, 9:27 AM   LOS: 4 days

## 2015-07-29 NOTE — Procedures (Signed)
History: Brooke Watson is an 82 y.o. female patient with altered mental status. Routine inpatient EEG was performed for further evaluation.   Patient Active Problem List   Diagnosis Date Noted  . Malnutrition of moderate degree 07/27/2015  . ICH (intracerebral hemorrhage) ([place])   . Nontraumatic cortical hemorrhage of right cerebral hemisphere ([place])   . Other specified hypothyroidism   . Dehydration 07/25/2015  . Acute cerebral hemorrhage ([place]) 07/25/2015  . Accelerated hypertension 07/25/2015  . Acute encephalopathy 06/13/2015  . Aggressive behavior 09/05/2014  . Dementia with behavioral disturbance 07/01/2014  . Delusions ([place]) 07/01/2014  . RLS (restless legs syndrome) 02/16/2014  . Cognitive decline 11/02/2013    Current facility-administered medications:  .  acetaminophen (TYLENOL) tablet 500 mg, 500 mg, Oral, Q6H PRN **OR** acetaminophen (TYLENOL) suppository 650 mg, 650 mg, Rectal, Q6H PRN, Cherene Altes, MD .  amLODipine (NORVASC) tablet 5 mg, 5 mg, Oral, Daily, Cherene Altes, MD, 5 mg at 07/29/15 0930 .  artificial tears (LACRILUBE) ophthalmic ointment, , Both Eyes, Q3H PRN, Cherene Altes, MD, 1 application at [id number] .  dextrose 5 % and 0.9 % NaCl with KCl 20 mEq/L infusion, , Intravenous, Continuous, Cherene Altes, MD, Last Rate: 30 mL/hr at 07/29/15 1159 .  feeding supplement (ENSURE ENLIVE) (ENSURE ENLIVE) liquid 237 mL, 237 mL, Oral, BID BM, Reyne Dumas, MD .  labetalol (NORMODYNE) tablet 100 mg, 100 mg, Oral, TID, Cherene Altes, MD, 100 mg at 07/29/15 0929 .  labetalol (NORMODYNE,TRANDATE) injection 20 mg, 20 mg, Intravenous, Q4H PRN, Lily Kocher, MD, 20 mg at 07/28/15 0404 .  lactulose (CHRONULAC) 10 GM/15ML solution 30 g, 30 g, Oral, BID, Cherene Altes, MD, 30 g at 07/29/15 0930 .  levothyroxine (SYNTHROID, LEVOTHROID) tablet 50 mcg, 50 mcg, Oral, QAC breakfast, Cherene Altes, MD, 50 mcg at 07/29/15 [id number]   Introduction:  This is  a 19 channel routine scalp EEG performed at the bedside with bipolar and monopolar montages arranged in accordance to the international 10/20 system of electrode placement. One channel was dedicated to EKG recording.   Findings:  generalized background slowing with the best posterior activityy around 5-6 Hz is noted, with intermittent delta slowing and intermittent high amplitude generalized periodic discharges with triphasic morphology. No other abnormal epileptiform discharges or evidence of electrographic seizures were noted during this recording.   Impression:  Abnormal routine inpatient EEG suggestive of at least moderate encephalopathy as described. Clinical correlation is recommended .

## 2015-07-29 NOTE — Progress Notes (Signed)
Interval History:                                                                                                                      Brooke Watson is an 82 y.o. female patient with subdural hematoma secondary to recurrent falls. EEG showed evidence of encephalopathy.    Past Medical History: Past Medical History  Diagnosis Date  . High cholesterol   . Cognitive decline   . Dementia   . Hypertension   . Thyroid disease   . UTI (urinary tract infection)   . Arthritis     Past Surgical History  Procedure Laterality Date  . Colon surgery      Family History: Family History  Problem Relation Age of Onset  . Dementia Neg Hx     Social History:   reports that she has quit smoking. She has never used smokeless tobacco. She reports that she does not drink alcohol or use illicit drugs.  Allergies:  Allergies  Allergen Reactions  . Ambien [Zolpidem Tartrate]     Disorientation   . Amoxicillin     Unknown.  Has patient had a PCN reaction causing immediate rash, facial/tongue/throat swelling, SOB or lightheadedness with hypotension: unknown Has patient had a PCN reaction causing severe rash involving mucus membranes or skin necrosis: unknown Has patient had a PCN reaction that required hospitalization No Has patient had a PCN reaction occurring within the last 10 years: unknown If all of the above answers are "NO", then may proceed with Cephalosporin use.      Medications:                                                                                                                         Current facility-administered medications:  .  acetaminophen (TYLENOL) tablet 500 mg, 500 mg, Oral, Q6H PRN **OR** acetaminophen (TYLENOL) suppository 650 mg, 650 mg, Rectal, Q6H PRN, Cherene Altes, MD .  amLODipine (NORVASC) tablet 5 mg, 5 mg, Oral, Daily, Cherene Altes, MD, 5 mg at 07/29/15 0930 .  artificial tears (LACRILUBE) ophthalmic ointment, , Both Eyes, Q3H PRN,  Cherene Altes, MD, 1 application at [id number] .  [START ON 07/30/2015] carbidopa-levodopa (SINEMET IR) 10-100 MG per tablet immediate release 1 tablet, 1 tablet, Oral, TID, [place] Christman Fuller Mandril, MD .  dextrose 5 % and 0.9 % NaCl with KCl 20 mEq/L infusion, , Intravenous, Continuous, Cherene Altes, MD, Last Rate: 30 mL/hr at 07/29/15  1159 .  feeding supplement (ENSURE ENLIVE) (ENSURE ENLIVE) liquid 237 mL, 237 mL, Oral, BID BM, Reyne Dumas, MD, 237 mL at 07/29/15 1420 .  labetalol (NORMODYNE) tablet 100 mg, 100 mg, Oral, TID, Cherene Altes, MD, 100 mg at 07/29/15 1742 .  labetalol (NORMODYNE,TRANDATE) injection 20 mg, 20 mg, Intravenous, Q4H PRN, Lily Kocher, MD, 20 mg at 07/28/15 0404 .  lactulose (CHRONULAC) 10 GM/15ML solution 30 g, 30 g, Oral, BID, Cherene Altes, MD, 30 g at 07/29/15 0930 .  levothyroxine (SYNTHROID, LEVOTHROID) tablet 50 mcg, 50 mcg, Oral, QAC breakfast, Cherene Altes, MD, 50 mcg at 07/29/15 0833   Neurologic Examination:                                                                                                     Today's Vitals   07/29/15 0530 07/29/15 0931 07/29/15 1821 07/29/15 2100  BP: 152/84 160/91 141/87 133/100  Pulse: 62 75 75 73  Temp: 98.4 F (36.9 C) 98.5 F (36.9 C) 98.3 F (36.8 C) 98 F (36.7 C)  TempSrc: Oral Axillary Oral Axillary  Resp: 18 20 20 18   Height:      Weight:      SpO2: 96% 98% 99% 95%   Alert, hypophonia with mumbling speech, oriented to people and place, rigidity in bilateral upper and lower extremities with resting tremor and cogwheeling noted.    Lab Results: Basic Metabolic Panel:  Recent Labs Lab 07/25/15 1254 07/26/15 0420 07/28/15 0510 07/29/15 0646 07/29/15 1044  NA 142 143 141 140  --   K 4.2 3.5 3.5 3.6  --   CL 107 107 108 107  --   CO2 26 24 22 22   --   GLUCOSE 116* 116* 116* 113*  --   BUN 18 12 10 14   --   CREATININE 1.14* 0.98 0.88 0.79  --   CALCIUM 9.1 8.5* 8.5*  8.6*  --   MG  --   --   --   --  2.1    Liver Function Tests:  Recent Labs Lab 07/25/15 1254 07/26/15 0420 07/28/15 0510  AST 45* 36 22  ALT 20 16 13*  ALKPHOS 85 72 59  BILITOT 1.3* 1.4* 0.8  PROT 7.4 6.0* 5.5*  ALBUMIN 4.1 3.1* 2.5*   No results for input(s): LIPASE, AMYLASE in the last 168 hours.  Recent Labs Lab 07/28/15 0510 07/29/15 0606  AMMONIA 72* 31    CBC:  Recent Labs Lab 07/25/15 1254 07/28/15 0510  WBC 10.2 8.0  NEUTROABS 6.8  --   HGB 14.9 13.0  HCT 44.2 39.5  MCV 89.7 89.4  PLT 206 175    Cardiac Enzymes: No results for input(s): CKTOTAL, CKMB, CKMBINDEX, TROPONINI in the last 168 hours.  Lipid Panel: No results for input(s): CHOL, TRIG, HDL, CHOLHDL, VLDL, LDLCALC in the last 168 hours.  CBG: No results for input(s): GLUCAP in the last 168 hours.  Microbiology: Results for orders placed or performed during the hospital encounter of 07/25/15  Urine culture     Status: None  Collection Time: 07/25/15  3:02 PM  Result Value Ref Range Status   Specimen Description URINE, CLEAN CATCH  Final   Special Requests NONE  Final   Culture   Final    NO GROWTH 1 DAY Performed at [facility]    Report Status 07/26/2015 FINAL  Final  MRSA PCR Screening     Status: None   Collection Time: 07/25/15  6:57 PM  Result Value Ref Range Status   MRSA by PCR NEGATIVE NEGATIVE Final    Comment:        The GeneXpert MRSA Assay (FDA approved for NASAL specimens only), is one component of a comprehensive MRSA colonization surveillance program. It is not intended to diagnose MRSA infection nor to guide or monitor treatment for MRSA infections.     Imaging: Ct Head Wo Contrast  07/26/2015  ADDENDUM REPORT: 07/26/2015 17:13 ADDENDUM: Study discussed by telephone with Dr. Silverio Decamp on 07/26/2015 at 1710 hours. Electronically Signed   By: Genevie Ann M.D.   On: 07/26/2015 17:13  07/26/2015  CLINICAL DATA:  82 year old female with acute intracranial  hemorrhage, presented with altered mental status after a fall. Hemorrhage in the anterior corpus callosum, subarachnoid and subdural hemorrhage. Initial encounter. EXAM: CT HEAD WITHOUT CONTRAST TECHNIQUE: Contiguous axial images were obtained from the base of the skull through the vertex without intravenous contrast. COMPARISON:  Head CT 07/25/2015.  CTA head and neck 07/25/2015. FINDINGS: No skull fracture identified. Visualized paranasal sinuses and mastoids are clear. There is a left posterior convexity scalp hematoma measuring up to 5 mm in thickness. Negative visualized orbits soft tissues. Anterior body corpus callosum hyperdense hemorrhage has not significantly changed (series 2, image 17). There is a small volume of subarachnoid hemorrhage in the right sylvian fissure which has slightly increased since yesterday. A small volume of subdural hematoma along the right tentorium is stable. No intraventricular hemorrhage identified. There is a new left side subdural hygroma measuring 5 mm in thickness. Associated 2-3 mm of rightward midline shift is new. Trace subarachnoid hemorrhage at the superior right central sulcus is stable. There may be trace para fall seen subdural or subarachnoid hemorrhage. Generalized cerebral volume loss. Ex vacuo appearing mild ventriculomegaly. No cortically based acute infarct identified. Stable gray-white matter differentiation throughout the brain. IMPRESSION: 1. New small left side subdural hygroma since yesterday. Associated new rightward midline shift of 2-3 mm. 2. Stable corpus callosum hemorrhage. Small volume of right hemisphere subarachnoid hemorrhage has not significantly changed. 3. Small volume right tentorial subdural hematoma is stable. 4. Left posterior scalp hematoma without underlying fracture. Electronically Signed: By: Genevie Ann M.D. On: 07/26/2015 17:02   Mr Hilary Hertz  07/25/2015  CLINICAL DATA:  Initial evaluation for acute intracranial hemorrhage.  Evaluate for dural sinus thrombosis. EXAM: MR VENOGRAM the HEAD WITHOUT CONTRAST TECHNIQUE: Angiographic images of the intracranial venous structures were obtained using MRV technique without intravenous contrast. COMPARISON:  None. FINDINGS: Study is somewhat limited by lack of IV contrast. The major dural sinuses appear patent. Specifically, the superior sagittal sinus, right transverse sinus, right sigmoid sinus, and proximal right internal jugular vein are widely patent. The superior sagittal sinus is partially fenestrated. The left transverse sinus is hypoplastic, with the left the distal transverse and sigmoid sinuses largely supplied via the vein of Labbe. Proximal left internal jugular vein is patent. Straight sinus, vein of Galen, and internal cerebral veins appear patent. No evidence for dural sinus thrombosis. No obvious cortical vein thrombosis. Parafalcine hemorrhage and small  portal hemorrhages again noted, better evaluated on prior CT. IMPRESSION: Normal MRV.  No evidence for dural sinus thrombosis. Electronically Signed   By: Jeannine Boga M.D.   On: 07/25/2015 23:51    Assessment and plan:   Brooke Watson is an 82 y.o. female patient with subdural hematoma secondary to recurrent falls. EEG showed evidence of encephalopathy. Clinically also noted to have evidence of Parkinson's disease. Recommend starting Sinemet 10/100 times a day.  Physical and occupational therapy, speech therapy evaluation.  We'll follow-up

## 2015-07-29 NOTE — Progress Notes (Signed)
EEG Completed; Results Pending  

## 2015-07-29 NOTE — Progress Notes (Signed)
Initial Nutrition Assessment  DOCUMENTATION CODES:   Non-severe (moderate) malnutrition in context of chronic illness  INTERVENTION:  Provide Ensure Enlive po BID, each supplement provides 350 kcal and 20 grams of protein Provide Magic Cup with meals Use Magic cup, pudding, or yogurt for medication administration   NUTRITION DIAGNOSIS:   Inadequate oral intake related to lethargy/confusion as evidenced by per patient/family report.  Ongoing  GOAL:   Patient will meet greater than or equal to 90% of their needs  Unmet  MONITOR:   PO intake, Supplement acceptance, Labs, Skin, I & O's, Weight trends  REASON FOR ASSESSMENT:   Malnutrition Screening Tool, Low [place]    ASSESSMENT:   82 yo female admitted on 4/17 s/p fall with ICH.   Pt was advanced to a dysphagia 1 with with thin liquids yesterday. Per chart and family, pt has not been eating much. Per SLP note, pt took 2 small bites and refused additional PO. Magic cup ice cream at bedside with a few bites consumed. Husband at bedside states that patient would not be interested in NG tube. RD suggested focusing on high calorie, high protein foods at meal times and using higher calorie foods (such as Magic Cup ice cream, pudding, and yogurt) for medication administration. Palliative care has been consulted.   Labs reviewed.   Diet Order:  DIET - DYS 1 Room service appropriate?: Yes; Fluid consistency:: Thin  Skin:  Wound (see comment) (blister to left heel)  Last BM:  4/19  Height:   Ht Readings from Last 1 Encounters:  07/25/15 5\' 5"  (1.651 m)    Weight:   Wt Readings from Last 1 Encounters:  07/25/15 143 lb 4.8 oz (65 kg)    Ideal Body Weight:  56.8 kg  BMI:  Body mass index is 23.85 kg/(m^2).  Estimated Nutritional Needs:   Kcal:  1400-1600  Protein:  80-90 gm  Fluid:  >/= 1.5 L  EDUCATION NEEDS:   No education needs identified at this time  [place], LDN Inpatient Clinical  Dietitian Pager: [phone] After Hours Pager: [phone]

## 2015-07-30 ENCOUNTER — Inpatient Hospital Stay (HOSPITAL_COMMUNITY): Payer: Medicare Other

## 2015-07-30 DIAGNOSIS — Z515 Encounter for palliative care: Secondary | ICD-10-CM | POA: Diagnosis present

## 2015-07-30 LAB — AMMONIA: Ammonia: 39 umol/L — ABNORMAL HIGH (ref 9–35)

## 2015-07-30 MED ORDER — CLONIDINE HCL 0.2 MG/24HR TD PTWK
0.2000 mg | MEDICATED_PATCH | TRANSDERMAL | Status: DC
  Administered 2015-07-30: 0.2 mg via TRANSDERMAL
  Filled 2015-07-30: qty 1

## 2015-07-30 NOTE — Progress Notes (Signed)
EEG Completed; Results Pending  

## 2015-07-30 NOTE — Progress Notes (Signed)
Interval History:                                                                                                                      Brooke Watson is an 82 y.o. female patient with  acute encephalopathy, posttraumatic intracerebral subdural hemorrhage,, evidence of advanced extrapyramidal symptoms with rigidity and resting tremor noted.   No significant change clinically from neurological standpoint.    Past Medical History: Past Medical History  Diagnosis Date  . High cholesterol   . Cognitive decline   . Dementia   . Hypertension   . Thyroid disease   . UTI (urinary tract infection)   . Arthritis     Past Surgical History  Procedure Laterality Date  . Colon surgery      Family History: Family History  Problem Relation Age of Onset  . Dementia Neg Hx     Social History:   reports that she has quit smoking. She has never used smokeless tobacco. She reports that she does not drink alcohol or use illicit drugs.  Allergies:  Allergies  Allergen Reactions  . Ambien [Zolpidem Tartrate]     Disorientation   . Amoxicillin     Unknown.  Has patient had a PCN reaction causing immediate rash, facial/tongue/throat swelling, SOB or lightheadedness with hypotension: unknown Has patient had a PCN reaction causing severe rash involving mucus membranes or skin necrosis: unknown Has patient had a PCN reaction that required hospitalization No Has patient had a PCN reaction occurring within the last 10 years: unknown If all of the above answers are "NO", then may proceed with Cephalosporin use.      Medications:                                                                                                                         Current facility-administered medications:  .  acetaminophen (TYLENOL) tablet 500 mg, 500 mg, Oral, Q6H PRN **OR** acetaminophen (TYLENOL) suppository 650 mg, 650 mg, Rectal, Q6H PRN, Cherene Altes, MD .  amLODipine (NORVASC) tablet 5 mg, 5 mg,  Oral, Daily, Cherene Altes, MD, 5 mg at 07/29/15 0930 .  artificial tears (LACRILUBE) ophthalmic ointment, , Both Eyes, Q3H PRN, Cherene Altes, MD, 1 application at [id number] .  cloNIDine (CATAPRES - Dosed in mg/24 hr) patch 0.2 mg, 0.2 mg, Transdermal, Weekly, Reyne Dumas, MD, 0.2 mg at 07/30/15 1532 .  dextrose 5 % and 0.9 % NaCl with KCl 20  mEq/L infusion, , Intravenous, Continuous, Cherene Altes, MD, Last Rate: 30 mL/hr at 07/30/15 0242 .  feeding supplement (ENSURE ENLIVE) (ENSURE ENLIVE) liquid 237 mL, 237 mL, Oral, BID BM, Reyne Dumas, MD, 237 mL at 07/29/15 1420 .  labetalol (NORMODYNE) tablet 100 mg, 100 mg, Oral, TID, Cherene Altes, MD, 100 mg at 07/30/15 2137 .  labetalol (NORMODYNE,TRANDATE) injection 20 mg, 20 mg, Intravenous, Q4H PRN, Lily Kocher, MD, 20 mg at 07/30/15 1728 .  levothyroxine (SYNTHROID, LEVOTHROID) tablet 50 mcg, 50 mcg, Oral, QAC breakfast, Cherene Altes, MD, 50 mcg at 07/29/15 [id number]   Neurologic Examination:                                                                                                     Today's Vitals   07/30/15 1045 07/30/15 1554 07/30/15 1846 07/30/15 2204  BP: 158/94 165/97 162/87 158/92  Pulse: 62 73 57 62  Temp: 98 F (36.7 C) 98.1 F (36.7 C) 98 F (36.7 C) 97.7 F (36.5 C)  TempSrc: Axillary Axillary Axillary Oral  Resp: 18 18 18 18   Height:      Weight:      SpO2: 97% 96% 95% 97%       Lab Results: Basic Metabolic Panel:  Recent Labs Lab 07/25/15 1254 07/26/15 0420 07/28/15 0510 07/29/15 0646 07/29/15 1044  NA 142 143 141 140  --   K 4.2 3.5 3.5 3.6  --   CL 107 107 108 107  --   CO2 26 24 22 22   --   GLUCOSE 116* 116* 116* 113*  --   BUN 18 12 10 14   --   CREATININE 1.14* 0.98 0.88 0.79  --   CALCIUM 9.1 8.5* 8.5* 8.6*  --   MG  --   --   --   --  2.1    Liver Function Tests:  Recent Labs Lab 07/25/15 1254 07/26/15 0420 07/28/15 0510  AST 45* 36 22  ALT 20 16 13*   ALKPHOS 85 72 59  BILITOT 1.3* 1.4* 0.8  PROT 7.4 6.0* 5.5*  ALBUMIN 4.1 3.1* 2.5*   No results for input(s): LIPASE, AMYLASE in the last 168 hours.  Recent Labs Lab 07/28/15 0510 07/29/15 0606 07/30/15 1521  AMMONIA 72* 31 39*    CBC:  Recent Labs Lab 07/25/15 1254 07/28/15 0510  WBC 10.2 8.0  NEUTROABS 6.8  --   HGB 14.9 13.0  HCT 44.2 39.5  MCV 89.7 89.4  PLT 206 175    Cardiac Enzymes: No results for input(s): CKTOTAL, CKMB, CKMBINDEX, TROPONINI in the last 168 hours.  Lipid Panel: No results for input(s): CHOL, TRIG, HDL, CHOLHDL, VLDL, LDLCALC in the last 168 hours.  CBG: No results for input(s): GLUCAP in the last 168 hours.  Microbiology: Results for orders placed or performed during the hospital encounter of 07/25/15  Urine culture     Status: None   Collection Time: 07/25/15  3:02 PM  Result Value Ref Range Status   Specimen Description URINE, CLEAN CATCH  Final   Special  Requests NONE  Final   Culture   Final    NO GROWTH 1 DAY Performed at [facility]    Report Status 07/26/2015 FINAL  Final  MRSA PCR Screening     Status: None   Collection Time: 07/25/15  6:57 PM  Result Value Ref Range Status   MRSA by PCR NEGATIVE NEGATIVE Final    Comment:        The GeneXpert MRSA Assay (FDA approved for NASAL specimens only), is one component of a comprehensive MRSA colonization surveillance program. It is not intended to diagnose MRSA infection nor to guide or monitor treatment for MRSA infections.     Imaging: Ct Head Wo Contrast  07/30/2015  CLINICAL DATA:  AMS F/U ICH PMH: HTN, High cholesterol; Cognitive decline; Dementia; Thyroid disease; UTI; Arthritis EXAM: CT HEAD WITHOUT CONTRAST TECHNIQUE: Contiguous axial images were obtained from the base of the skull through the vertex without intravenous contrast. COMPARISON:  CT 07/26/2015 FINDINGS: Anterior corpus callosum hemorrhage measures 21 mmx 14 mm compared to 19 x 18 mm for no  significant change in volume. Small amount of subarachnoid blood along the sylvian fissure and over the high RIGHT frontal parietal lobe . Small amounts of subdural hematoma over the RIGHT tentorium and along the posterior LEFT interhemispheric fissure is unchanged (image 16, series 2). Stable ventricular dilatation.  No intraventricular hemorrhage. There is interval resolution of the low-density subdural hygroma previously seen on the LEFT. N o new intracranial hemorrhage. IMPRESSION: 1. Essentially stable anterior corpus callosum hemorrhage. 2. Slight improvement in subarachnoid hemorrhage along the RIGHT temporal lobe. 3. No significant change in the subdural hematoma over the right tentorium and posterior interhemispheric fissure. 4. Stable ventricular dilatation. Electronically Signed   By: Suzy Bouchard M.D.   On: 07/30/2015 18:49    Assessment and plan:   ARTELIA MARGOSIAN is an 82 y.o. female patient with  acute encephalopathy, posttraumatic intracerebral subdural hemorrhage,, evidence of advanced extrapyramidal symptoms with rigidity and resting tremor noted.   No significant change clinically from neurological standpoint.   Repeat CT brain showed improvement of the left frontal hygroma and stable interhemispheric subdural hematoma. Per radiology report, Essentially stable anterior corpus callosum hemorrhage. Slight improvement in subarachnoid hemorrhage along the RIGHT temporal lobe. No significant change in the subdural hematoma over the right tentorium and posterior interhemispheric fissure. Stable ventricular dilatation.  EEG continued to show evidence of mild to moderate encephalopathy.   Spoke about her neurological assessment with her daughter on phone, who apparently is a NP in [place]. She does not believe that the patient has Parkinson's disease or any extrapyramidal symptoms.  Per her report, based on her prior discussion with patient's outpatient neurologist, Dr. Jaynee Eagles, her  gait instability and tremors were believed to be side effects to antipsychotic medication use in the past. She was told to have gait apraxia contributing to her gait instability and falls.  Hence, based on today's discussion with her daughter, at this time I do not recommend continuing any new Parkinson medicines and defer this to her regular outpatient neurologist Dr. Jaynee Eagles to follow-up after discharge.   Discussed with primary hospitalist, Dr. Allyson Sabal about blood pressure management and she'll be adding clonidine patch to her regimen. Her blood pressures have remained less than 0000000 systolic.  Repeat CT of the brain showed stable appearance of the small intracranial hemorrhages as described above.  No additional neurodiagnostic testing or any new treatments from neurological standpoint at this time. Continued physical, occupational  therapy and speech swallow evaluation as tolerated.   We'll follow-up PRN .  please call for any further questions

## 2015-07-30 NOTE — Procedures (Signed)
History: Brooke Watson is an 82 y.o. female patient with altered mental status. Routine inpatient EEG was performed for further evaluation.   Patient Active Problem List   Diagnosis Date Noted  . Palliative care encounter   . Parkinson disease ([place])   . SDH (subdural hematoma) ([place])   . Malnutrition of moderate degree 07/27/2015  . ICH (intracerebral hemorrhage) ([place])   . Nontraumatic cortical hemorrhage of right cerebral hemisphere ([place])   . Other specified hypothyroidism   . Dehydration 07/25/2015  . Acute cerebral hemorrhage ([place]) 07/25/2015  . Accelerated hypertension 07/25/2015  . Acute encephalopathy 06/13/2015  . Aggressive behavior 09/05/2014  . Dementia with behavioral disturbance 07/01/2014  . Delusions ([place]) 07/01/2014  . RLS (restless legs syndrome) 02/16/2014  . Cognitive decline 11/02/2013     Current facility-administered medications:  .  acetaminophen (TYLENOL) tablet 500 mg, 500 mg, Oral, Q6H PRN **OR** acetaminophen (TYLENOL) suppository 650 mg, 650 mg, Rectal, Q6H PRN, Cherene Altes, MD .  amLODipine (NORVASC) tablet 5 mg, 5 mg, Oral, Daily, Cherene Altes, MD, 5 mg at 07/29/15 0930 .  artificial tears (LACRILUBE) ophthalmic ointment, , Both Eyes, Q3H PRN, Cherene Altes, MD, 1 application at [id number] .  cloNIDine (CATAPRES - Dosed in mg/24 hr) patch 0.2 mg, 0.2 mg, Transdermal, Weekly, Reyne Dumas, MD, 0.2 mg at 07/30/15 1532 .  dextrose 5 % and 0.9 % NaCl with KCl 20 mEq/L infusion, , Intravenous, Continuous, Cherene Altes, MD, Last Rate: 30 mL/hr at 07/30/15 0242 .  feeding supplement (ENSURE ENLIVE) (ENSURE ENLIVE) liquid 237 mL, 237 mL, Oral, BID BM, Reyne Dumas, MD, 237 mL at 07/29/15 1420 .  labetalol (NORMODYNE) tablet 100 mg, 100 mg, Oral, TID, Cherene Altes, MD, Stopped at 07/29/15 2200 .  labetalol (NORMODYNE,TRANDATE) injection 20 mg, 20 mg, Intravenous, Q4H PRN, Lily Kocher, MD, 20 mg at 07/30/15 1240 .  lactulose  (CHRONULAC) 10 GM/15ML solution 30 g, 30 g, Oral, BID, Cherene Altes, MD, Stopped at 07/29/15 2200 .  levothyroxine (SYNTHROID, LEVOTHROID) tablet 50 mcg, 50 mcg, Oral, QAC breakfast, Cherene Altes, MD, 50 mcg at 07/29/15 [id number]   Introduction:  This is a 19 channel routine scalp EEG performed at the bedside with bipolar and monopolar montages arranged in accordance to the international 10/20 system of electrode placement. One channel was dedicated to EKG recording.   Findings:  The best background rhythm in wakefulness reaches up to 7 Hz. Intermittent generalized background slowing and FIRDA activity noted.  No definite evidence of abnormal epileptiform discharges or electrographic seizures were noted during this recording.   Impression:  Abnormal awake and drowsy routine inpatient EEG suggestive of mild to moderate encephalopathy as described.  Clinical correlation is recommended .

## 2015-07-30 NOTE — Progress Notes (Signed)
Pt in bed this morning, did not follow commands or respond to any question, she refused to take medicines as her teeth are clenched. Her husband is at her bedside, she responds to her husband stating "what does that mean?" in response to him saying "you cant leave from here if you don't open your mouth to eat".  Will continue to monitor.

## 2015-07-30 NOTE — Progress Notes (Signed)
Called daughter, [name], at husband's request who is a NP, out of state. She has multiple questions and concerns. Notified Dr. Dicie Beam as well as Dr. Allyson Sabal that daughter would like a return call and update before she can assist an [place] discussions. Both agreed to call Ms. BoBo. Palliative medicine will hold off for now on official consult but will be happy to reengage at late time if family desires. Thank you, Romona Curls, ANP

## 2015-07-30 NOTE — Progress Notes (Addendum)
Brooke Watson [id number] DOB: 1933/08/09 DOA: 07/25/2015 PCP: Gilford Rile, MD   Admit HPI / Brief Narrative: 82 y.o. F Hx Dementia, TIA, HTN, HLD, and Hypothyroidism who was brought to the ED by her husband for evaluation of progressive lethargy, decreased oral intake, and gait disturbance since a recent fall. The patient was admitted in March for dehydration. She had an extensive work-up then that was fairly unremarkable. There were ongoing discussions about whether dose adjustments needed to be made on some of her neuropsych medications.  CT scan in the ED showed acute cerebral hemorrhage,multiple intracranial bleed S/P fall. Neurology and NS were both consulted.    HPI/Subjective:  not follow simple commands,not waking up Patient unable to take her pills  Assessment/Plan:  Acute IntraCerebral Hemorrhage / Acute Encephalopathy -unable to determine clinically if associated w/ falls or not  Neurosurgery does not feel intervention is warranted -monitoring and medical care per Neurology, discussed with Dr. Silverio Decamp regarding patient's prognosis, he recommended EEG for further evaluation, EEG showed encephalopathy -mental status now appears to be slowly improving  -hold depakote for now (was on for dementia/behavioral issue prior) in setting of elevated ammonia -B12 and folate normal  Remains lethargic , remains NPO given minimal alertness Started on Sinemet for possible Parkinson's disease Palliative care consultation based on patient's prognosis, patient's prognosis appears to be poor, may not ever returned to baseline, may be eligible for hospice if family agrees Neurology Ram Fuller Mandril, MD has personal spoken to [name] , The patient's daughter and explained to her the results of workup Patient remains encephalopathic with extrapyramidal symptoms, Daughter refused to administer Sinemet Palliative care consultation requested for goals of care as well as  addressing the fact that the patient is not eating or drinking We will repeat a CT scan of the head today, as the daughter claims that the patient is acutely declined in the last 48 hours to seem to have been any changes  Elevated ammonia -likely contributing to acute encephalopathy, 72> 31 -can be related to depakote use - stop depakote - tx short term w/ lactulose - follow   Accelerated HTN -BP improving - goal <160  Dementia -family confirms she is conversant at baseline, though orientation waxes/wanes  Diet recommendations: Dysphagia 1 (puree);Thin liquid  Mildly abnormal LFTs  -resolved  Hypothyroidism -TSH WNL, though not at goal of 1.0 - cont usual synthroid dose   Non-severe (moderate) malnutrition in context of chronic illness Diet resumed today - however patient not following commands  Code Status: FULL Family Communication: spoke w/ husband In multiple systems including the patient's daughter [name] 4/21 and 4/22. Disposition Plan: Will need SNF, - family determine to take pt home instead of SNF, social work consult for further evaluation, prognostic evaluation by neurology, palliative care consultation ordered   Daughter [name] was extremely Loud,rude, angry, requesting to repeat all labs including a CT of the head. She does not fully agree with neurology recommendations.she does not agree with starting the patient on Sinemet. She does not understand why speech therapy has not initiated her on a diet. According to RN, she has been calling every hour To them, to Follow patient's blood pressure readings. She keeps repeating the fact that her blood pressure parameters are not being followed. Despite being told that the patient's blood pressure is difficult to control in the setting of her not take being able to take by mouth medications for the last 48 hours she keeps  focusing on the fact that her blood pressure should be less than [id number] systolic.She Keeps repeating that  Patient's outpatient neurologist, Does not agree with inpatient management.She states that she works for a [employer] rights manager who does not agree with patient's current inpatient management,   Consultants: Neurology NS Palliative care  Procedures: none  Antibiotics: none  DVT prophylaxis: SCDs  Objective: Blood pressure 158/94, pulse 62, temperature 98 F (36.7 C), temperature source Axillary, resp. rate 18, height 5\' 5"  (1.651 m), weight 65 kg (143 lb 4.8 oz), SpO2 97 %. No intake or output data in the 24 hours ending 07/30/15 1214 Exam: General: No acute respiratory distress evident  Lungs: Clear to auscultation bilaterally - no wheezes or crackles Cardiovascular: Regular rate and rhythm without murmur  Abdomen: Nontender, nondistended, soft, bowel sounds positive, no rebound, no ascites, no appreciable mass Extremities: No significant cyanosis, clubbing, edema bilateral lower extremities  Data Reviewed:  Basic Metabolic Panel:  Recent Labs Lab 07/25/15 1254 07/26/15 0420 07/28/15 0510 07/29/15 0646 07/29/15 1044  NA 142 143 141 140  --   K 4.2 3.5 3.5 3.6  --   CL 107 107 108 107  --   CO2 26 24 22 22   --   GLUCOSE 116* 116* 116* 113*  --   BUN 18 12 10 14   --   CREATININE 1.14* 0.98 0.88 0.79  --   CALCIUM 9.1 8.5* 8.5* 8.6*  --   MG  --   --   --   --  2.1    CBC:  Recent Labs Lab 07/25/15 1254 07/28/15 0510  WBC 10.2 8.0  NEUTROABS 6.8  --   HGB 14.9 13.0  HCT 44.2 39.5  MCV 89.7 89.4  PLT 206 175    Liver Function Tests:  Recent Labs Lab 07/25/15 1254 07/26/15 0420 07/28/15 0510  AST 45* 36 22  ALT 20 16 13*  ALKPHOS 85 72 59  BILITOT 1.3* 1.4* 0.8  PROT 7.4 6.0* 5.5*  ALBUMIN 4.1 3.1* 2.5*    Coags:  Recent Labs Lab 07/25/15 1932  INR 1.12    Recent Results (from the past 240 hour(s))  Urine culture     Status: None   Collection Time: 07/25/15  3:02 PM  Result Value Ref Range Status   Specimen Description URINE, CLEAN CATCH   Final   Special Requests NONE  Final   Culture   Final    NO GROWTH 1 DAY Performed at [facility]    Report Status 07/26/2015 FINAL  Final  MRSA PCR Screening     Status: None   Collection Time: 07/25/15  6:57 PM  Result Value Ref Range Status   MRSA by PCR NEGATIVE NEGATIVE Final    Comment:        The GeneXpert MRSA Assay (FDA approved for NASAL specimens only), is one component of a comprehensive MRSA colonization surveillance program. It is not intended to diagnose MRSA infection nor to guide or monitor treatment for MRSA infections.      Studies:   Recent x-ray studies have been reviewed in detail by the Attending Physician  Scheduled Meds:  Scheduled Meds: . amLODipine  5 mg Oral Daily  . carbidopa-levodopa  1 tablet Oral TID  . feeding supplement (ENSURE ENLIVE)  237 mL Oral BID BM  . labetalol  100 mg Oral TID  . lactulose  30 g Oral BID  . levothyroxine  50 mcg Oral QAC breakfast    Time spent on  care of this patient: 18 mins   Reyne Dumas , MD   [facility] Office  [phone] Pager - Text Page per Shea Evans as per below:  On-Call/Text Page:      [email or web address]      password [email or web address]  If 7PM-7AM, please contact night-coverage [email or web address] Password [email or web address] 07/30/2015, 12:14 PM   LOS: 5 days

## 2015-07-31 DIAGNOSIS — Z7189 Other specified counseling: Secondary | ICD-10-CM | POA: Diagnosis present

## 2015-07-31 DIAGNOSIS — Z515 Encounter for palliative care: Secondary | ICD-10-CM | POA: Diagnosis present

## 2015-07-31 LAB — COMPREHENSIVE METABOLIC PANEL
ALK PHOS: 64 U/L (ref 38–126)
ALT: 15 U/L (ref 14–54)
AST: 21 U/L (ref 15–41)
Albumin: 2.4 g/dL — ABNORMAL LOW (ref 3.5–5.0)
Anion gap: 10 (ref 5–15)
BILIRUBIN TOTAL: 0.8 mg/dL (ref 0.3–1.2)
BUN: 23 mg/dL — ABNORMAL HIGH (ref 6–20)
CALCIUM: 8.8 mg/dL — AB (ref 8.9–10.3)
CO2: 23 mmol/L (ref 22–32)
Chloride: 109 mmol/L (ref 101–111)
Creatinine, Ser: 0.8 mg/dL (ref 0.44–1.00)
Glucose, Bld: 117 mg/dL — ABNORMAL HIGH (ref 65–99)
Potassium: 3.7 mmol/L (ref 3.5–5.1)
Sodium: 142 mmol/L (ref 135–145)
Total Protein: 5.8 g/dL — ABNORMAL LOW (ref 6.5–8.1)

## 2015-07-31 LAB — CBC
HEMATOCRIT: 40.4 % (ref 36.0–46.0)
HEMOGLOBIN: 13.2 g/dL (ref 12.0–15.0)
MCH: 29.5 pg (ref 26.0–34.0)
MCHC: 32.7 g/dL (ref 30.0–36.0)
MCV: 90.4 fL (ref 78.0–100.0)
Platelets: 223 10*3/uL (ref 150–400)
RBC: 4.47 MIL/uL (ref 3.87–5.11)
RDW: 14 % (ref 11.5–15.5)
WBC: 8 10*3/uL (ref 4.0–10.5)

## 2015-07-31 LAB — AMMONIA: AMMONIA: 43 umol/L — AB (ref 9–35)

## 2015-07-31 MED ORDER — CLONIDINE HCL 0.3 MG/24HR TD PTWK
0.3000 mg | MEDICATED_PATCH | TRANSDERMAL | Status: DC
  Administered 2015-07-31: 0.3 mg via TRANSDERMAL
  Filled 2015-07-31 (×2): qty 1

## 2015-07-31 MED ORDER — LACTULOSE ENEMA
300.0000 mL | Freq: Once | ORAL | Status: AC
  Administered 2015-07-31: 300 mL via RECTAL
  Filled 2015-07-31: qty 300

## 2015-07-31 NOTE — Progress Notes (Signed)
Patient sleeping in bed this morning, easily aroused, no signs of pain or discomfort observed. Will continue to monitor.

## 2015-07-31 NOTE — Progress Notes (Signed)
Patient awake, eye resting still, not following command, teeth clenched, and not speaking. Husband says he is able to have her open her mouth by holding her nose and then putting food in, he is encouraged to stop holding her nose closed or putting food in her mouth as patient is cheeking the food and not swallowing-->when patient yawned, observed food still in mouth but patient is not opening her mouth to have the food swept out of her mouth. Suction is applied as best as possible but still not able to reach inside pt's mouth.  Will continue to monitor.

## 2015-07-31 NOTE — Progress Notes (Signed)
Patient's husband brought his cell phone to RN in another patient's room, asking RN to speak with his daughter about starting patient's feeding tube, RN informed him that she will be in his room to talk to him. Patient's husband continued to offer the phone, stepped outside the room into the hallway with patient's husband and encouraged him to tell his daughter that the doctor will be the best person to talk to about starting a feeding tube. He stated that his daughter has "fired" the doctor, RN informed patient that she is uncomfortable speaking with daughter about starting a feeding tube,  that it is out of her scope of practice. Patient's husband stated "I guess you are afraid to talk to my daughter" RN said "Yes Sir, I am afraid to talk to your daughter" Patient's husband walked away stating that they are going to call another doctor.  Patient is asleep this moment, no signs of pain or discomfort observed. Will continue to care and monitor patient.

## 2015-07-31 NOTE — Consult Note (Signed)
Consultation Note Date: 07/31/2015   Patient Name: Brooke Watson  DOB: 1933-10-21  MRN: [id number]  Age / Sex: 82 y.o., female  PCP: Raina Mina, MD Referring Physician: Reyne Dumas, MD  Reason for Consultation: Establishing goals of care    Clinical Assessment/Narrative:  Patient is a 82 year old lady with a past medical history significant for dementia. She lives at home in [place], [place] with her husband. Patient's sister helps manage her care. She sees Psychiatrist. She was hospitalized for dehydration 6 weeks ago. She has been admitted this time with a fall. She is found to have 2 small subdural bleeds. Initially, at the time of admission, she was seen and evaluated by neurosurgery. She has been followed by neurology in this hospitalization. She has undergone serial EEG evaluation. She is on antihypertensives.  Palliative care has been consultative for goals of care discussions, discussions regarding artificial nutrition and hydration. A shunt was initially evaluated by palliative NP [place] on 4-22.  All received from patient's daughter [name], nurse practitioner at [phone] on 4-23 at the palliative service number [phone]. Ms. [name] introduced herself as the patient's daughter. She states that she is most hopeful for getting the patient back to her previous level of quality of life. She wishes to "try to give her another chance to see if she will recover". She discusses extensively about her ideal goals of care. She wishes for placement of Dobbhoff tube, initiation of tube feeds, diligent efforts at physical therapy, diligent efforts at controlling blood pressure as well as getting the patient out of bed, repeating speech therapy evaluation. She has last seen the patient about 5-6 weeks ago when she was in town for her mother's prior hospitalization.  Briefly introduced scope of  palliative services as specialized medical care for people living with serious illness,focusing on providing relief from the symptoms and stress of a serious illness. The goal is to improve quality of life for both the patient and the family. Differences between hospice and palliative care explained briefly over the phone. Offered family meeting. Ms. [name] is trying to get back into town from [place] on 4-24. Likely anticipate family meeting by 4-25 Tuesday.  Patient seen and examined. Patient is an elderly lady resting in bed. She is able to briefly open her eyes. She is able to respond to some very few yes/no type questions appropriately. She is resting comfortably. She does not appear to be in any acute distress. Husband is present at the bedside. Introduced palliative care and myself. Acknowledged patient's husband and daughter's wishes for remaining hopeful as far as patient's stabilization/recovery. Discussed about placement of Dobbhoff tube and starting tube feeds. Discussed about physical therapy. Husband is asking whether or not the patient will be an appropriate candidate for [facility]. Will request case management to look into this.  See summary of recommendations below. Thank you for the consult. Palliative care will continue to follow along and help guide appropriate decision-making. Briefly discussed with Dr. Allyson Sabal.   Contacts/Participants in Discussion: Primary Decision Maker:     Relationship to Patient  Daughter [name] is only child, she is a neurology and neurosurgery NP in [place], [place],  husband.  HCPOA: yes     SUMMARY OF RECOMMENDATIONS: Full code Placement of Dobbhoff tube, initiation of tube feeds: I have placed a consult to interventional radiology to accomplish this. [facility] radiology, was notified that this will be expedited, possibly early on 4-24 Physical therapy continuous daily evaluations, husband  requests transfer to [facility] towards the end of this hospitalization Goals are for life maintaining/life-prolonging measures to continue.  Daughter trying to get into town by 4-24. Have offered family meeting as early as Tuesday 4-25 for facilitating additional goals of care discussions and to discuss disease trajectory. Palliative care will continue to follow along and help guide appropriate decision-making as well as disposition planning.  Code Status/Advance Care Planning: Full code    Code Status Orders        Start     Ordered   07/25/15 1900  Full code   Continuous     07/25/15 1859    Code Status History    Date Active Date Inactive Code Status Order ID Comments User Context   06/14/2015 12:04 AM 06/15/2015  7:57 PM Full Code [id number]  Etta Quill, DO ED      Other Directives:None  Symptom Management:    As above  Palliative Prophylaxis:   Bowel Regimen  Additional Recommendations (Limitations, Scope, Preferences):  Full Scope Treatment     Psycho-social/Spiritual:  Support System: Strong Desire for further Chaplaincy support:no Additional Recommendations: Caregiving  Support/Resources  Prognosis: Unable to determine  Discharge Planning: [facility] for rehab with Palliative care service follow-up   Chief Complaint/ Primary Diagnoses: Present on Admission:  . Dehydration . Acute cerebral hemorrhage ([place]) . Acute encephalopathy . Accelerated hypertension . Dementia with behavioral disturbance . ICH (intracerebral hemorrhage) ([place]) . Nontraumatic cortical hemorrhage of right cerebral hemisphere ([place]) . Other specified hypothyroidism  I have reviewed the medical record, interviewed the patient and family, and examined the patient. The following aspects are pertinent.  Past Medical History  Diagnosis Date  . High cholesterol   . Cognitive decline   . Dementia   . Hypertension   . Thyroid disease   . UTI (urinary tract infection)   . Arthritis      Social History   Social History  . Marital Status: Married    Spouse Name: Alveta Heimlich  . Number of Children: 1  . Years of Education: 12   Occupational History  . Retired [employer]   Social History Main Topics  . Smoking status: Former Research scientist (life sciences)  . Smokeless tobacco: Never Used  . Alcohol Use: No  . Drug Use: No  . Sexual Activity: No   Other Topics Concern  . None   Social History Narrative   Patient is married. Alveta Heimlich   Patient has a daughter.   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    Caffeine use: none    Family History  Problem Relation Age of Onset  . Dementia Neg Hx    Scheduled Meds: . amLODipine  5 mg Oral Daily  . cloNIDine  0.3 mg Transdermal Weekly  . feeding supplement (ENSURE ENLIVE)  237 mL Oral BID BM  . labetalol  100 mg Oral TID  . levothyroxine  50 mcg Oral QAC breakfast   Continuous Infusions: . dextrose 5 % and 0.9 % NaCl with KCl 20 mEq/L 30 mL/hr at 07/30/15 0242   PRN Meds:.acetaminophen **OR** acetaminophen, artificial tears, labetalol Medications Prior to Admission:  Prior to Admission medications   Medication Sig Start Date End Date Taking? Authorizing Provider  amLODipine (NORVASC) 5 MG tablet Take 5 mg by mouth daily.   Yes Historical Provider, MD  aspirin EC 81 MG tablet Take 1 tablet (81 mg total) by mouth daily. 06/30/14  Yes Melvenia Beam, MD  atorvastatin (LIPITOR) 10 MG tablet Take 10 mg by mouth at bedtime. 06/30/14  Yes Historical Provider, MD  divalproex (DEPAKOTE ER) 250 MG 24 hr tablet Take 1 tablet (250 mg total) by mouth daily with breakfast. 06/15/15  Yes Modena Jansky, MD  haloperidol (HALDOL) 0.5 MG tablet Take 1 tablet (0.5 mg total) by mouth 2 (two) times daily as needed (Only for severe agitation). Do not exceed 10 tabs monthly Patient taking differently: Take 0.25-0.5 mg by mouth 2 (two) times daily as needed (Only for severe agitation). Do not exceed 10 tabs monthly  05/17/15  Yes Melvenia Beam, MD  ibuprofen (ADVIL,MOTRIN) 200 MG tablet Take 400 mg by mouth 2 (two) times daily as needed for moderate pain.   Yes Historical Provider, MD  levothyroxine (SYNTHROID, LEVOTHROID) 50 MCG tablet Take 50 mcg by mouth daily before breakfast.   Yes Historical Provider, MD  Memantine HCl-Donepezil HCl (NAMZARIC) 28-10 MG CP24 Take 1 capsule by mouth daily.   Yes Historical Provider, MD  QUEtiapine (SEROQUEL) 100 MG tablet Take 100 mg by mouth at bedtime. 07/05/15  Yes Historical Provider, MD   Allergies  Allergen Reactions  . Ambien [Zolpidem Tartrate]     Disorientation   . Amoxicillin     Unknown.  Has patient had a PCN reaction causing immediate rash, facial/tongue/throat swelling, SOB or lightheadedness with hypotension: unknown Has patient had a PCN reaction causing severe rash involving mucus membranes or skin necrosis: unknown Has patient had a PCN reaction that required hospitalization No Has patient had a PCN reaction occurring within the last 10 years: unknown If all of the above answers are "NO", then may proceed with Cephalosporin use.     Review of Systems Patient is able to answer very few yes/no type questions appropriately otherwise does not verbalize much. Does not appear to be in any acute distress currently Physical Exam Elderly lady resting in bed in no acute distress currently S1-S2 Clear breath sounds anteriorly Abdomen soft nontender Trace edema bilateral lower extremities Opens eyes when name is called. Answers a few yes/no type questions appropriately. Vital Signs: BP 155/90 mmHg  Pulse 59  Temp(Src) 98.2 F (36.8 C) (Axillary)  Resp 20  Ht 5\' 5"  (1.651 m)  Wt 65 kg (143 lb 4.8 oz)  BMI 23.85 kg/m2  SpO2 97%  SpO2: SpO2: 97 % O2 Device:SpO2: 97 % O2 Flow Rate: .   IO: Intake/output summary: No intake or output data in the 24 hours ending 07/31/15 1708  LBM: Last BM Date: 07/29/15 Baseline Weight: Weight: 65 kg (143 lb  4.8 oz) Most recent weight: Weight: 65 kg (143 lb 4.8 oz)      Palliative Assessment/Data:  Flowsheet Rows        Most Recent Value   Intake Tab    Referral Department  Hospitalist   Unit at Time of Referral  Med/Surg Unit   Palliative Care Primary Diagnosis  Neurology   Palliative Care Type  New Palliative care   Reason for referral  Clarify Goals of Care   Date first seen by Palliative Care  07/31/15   Clinical Assessment    Palliative Performance Scale Score  30%   Pain Max last 24 hours  4   Pain Min Last 24 hours  3   Dyspnea Max Last 24 Hours  4   Dyspnea Min Last 24 hours  3   Psychosocial & Spiritual Assessment    Palliative Care Outcomes  Patient/Family meeting held?  Yes   Who was at the meeting?  husband at the bedside, daughter over the phone.    Palliative Care Outcomes  Clarified goals of care   Palliative Care follow-up planned  Yes, Facility      Additional Data Reviewed:  CBC:    Component Value Date/Time   WBC 8.0 07/31/2015 0544   WBC 7.1 07/14/2015 1133   HGB 13.2 07/31/2015 0544   HCT 40.4 07/31/2015 0544   HCT 43.3 07/14/2015 1133   PLT 223 07/31/2015 0544   PLT 189 07/14/2015 1133   MCV 90.4 07/31/2015 0544   MCV 90 07/14/2015 1133   NEUTROABS 6.8 07/25/2015 1254   LYMPHSABS 2.1 07/25/2015 1254   MONOABS 1.3* 07/25/2015 1254   EOSABS 0.0 07/25/2015 1254   BASOSABS 0.0 07/25/2015 1254   Comprehensive Metabolic Panel:    Component Value Date/Time   NA 142 07/31/2015 0544   NA 142 07/14/2015 1133   K 3.7 07/31/2015 0544   CL 109 07/31/2015 0544   CO2 23 07/31/2015 0544   BUN 23* 07/31/2015 0544   BUN 15 07/14/2015 1133   CREATININE 0.80 07/31/2015 0544   GLUCOSE 117* 07/31/2015 0544   GLUCOSE 83 07/14/2015 1133   CALCIUM 8.8* 07/31/2015 0544   AST 21 07/31/2015 0544   ALT 15 07/31/2015 0544   ALKPHOS 64 07/31/2015 0544   BILITOT 0.8 07/31/2015 0544   BILITOT 0.5 07/14/2015 1133   PROT 5.8* 07/31/2015 0544   PROT 6.2 07/14/2015  1133   ALBUMIN 2.4* 07/31/2015 0544   ALBUMIN 4.0 07/14/2015 1133     Time In:  [id number] Time Out:  1705 Time Total:  70 Greater than 50%  of this time was spent counseling and coordinating care related to the above assessment and plan.  Signed by: Loistine Chance, MD [id number] Loistine Chance, MD  07/31/2015, 5:08 PM  Please contact Palliative Medicine Team phone at [phone] for questions and concerns.

## 2015-07-31 NOTE — Progress Notes (Signed)
Brooke Watson [id number] DOB: [birth year] DOA: 07/25/2015 PCP: Brooke Rile, MD   Admit HPI / Brief Narrative: 82 y.o. F Hx Dementia, TIA, HTN, HLD, and Hypothyroidism who was brought to the ED by her husband for evaluation of progressive lethargy, decreased oral intake, and gait disturbance since a recent fall. The patient was admitted in March for dehydration. She had an extensive work-up then that was fairly unremarkable. There were ongoing discussions about whether dose adjustments needed to be made on some of her neuropsych medications. CT scan in the ED showed acute cerebral hemorrhage,multiple intracranial bleed S/P fall. Neurology and NS were both consulted.  Repeat CT scan stable  HPI/Subjective:  not follow simple commands, still not opening her eyes or taking her pills     Assessment/Plan: Acute IntraCerebral Hemorrhage / Acute Encephalopathy -unable to determine clinically if associated w/ falls or not  Neurosurgery does not feel intervention is warranted -monitoring and medical care per Neurology, discussed with Dr. Silverio Decamp regarding patient's prognosis, he recommended EEG for further evaluation, EEG showed encephalopathy -mental status now appears to be slowly improving  -hold depakote for now (was on for dementia/behavioral issue prior) in setting of elevated ammonia -B12 and folate normal  Remains lethargic , remains NPO given minimal alertness Started on Sinemet for possible Parkinson's disease Palliative care consultation based on patient's prognosis, patient's prognosis appears to be poor, may not ever returned to baseline, may be eligible for hospice if family agrees Neurology Brooke Fuller Mandril, MD has personal spoken to Brooke Watson , The patient's daughter and explained to her the results of workup Patient remains encephalopathic with extrapyramidal symptoms, Daughter refused to administer Sinemet Palliative care consultation requested for goals  of care as well as addressing the fact that the patient is not eating or drinking Repeat CT scan negative, ammonia level not significantly elevated to explain patient's symptoms, however will give lactulose enema today   Elevated ammonia -likely contributing to acute encephalopathy, 72> 31> 39 -can be related to depakote use - discontinue Depakote, treated with short-term lactulose,, now unable to take orally, therefore will use lactulose enema times one today  Accelerated HTN -BP improving - goal <160  Dementia -family confirms she is conversant at baseline, though orientation waxes/wanes  Diet recommendations: Dysphagia 1 (puree);Thin liquid  Mildly abnormal LFTs  -resolved  Hypothyroidism -TSH WNL, though not at goal of 1.0 - cont usual synthroid dose   Non-severe (moderate) malnutrition in context of chronic illness Diet resumed today - however patient not following commands  Code Status: FULL Family Communication: spoke w/ husband In multiple systems including the patient's daughter Brooke Watson 4/21 and 4/22, 4/23. Disposition Plan: Will need SNF, - family determine to take pt home instead of SNF, social work consult for further evaluation, prognostic evaluation by neurology, palliative care consultation ordered which patient refused    4/22Daughter Brooke Watson was extremely Loud,rude, angry, requesting to repeat all labs including a CT of the head. She does not fully agree with neurology recommendations.she does not agree with starting the patient on Sinemet. She does not understand why speech therapy has not initiated her on a diet, without the understanding  that and diet order  is in place and the patient is just not eating.  According to RN, she has been calling every hour To them, to Follow patient's blood pressure readings. She keeps repeating the fact that her blood pressure parameters are not being followed. Despite being told that the patient's  blood pressure is difficult  to control in the setting of her not take being able to take by mouth medications for the last 48 hours she keeps focusing on the fact that her blood pressure should be less than [id number] systolic.She Keeps repeating that Patient's outpatient neurologist, Does not agree with inpatient management.She states that she works for a Publishing rights manager who does not agree with patient's current inpatient management,   4/23-patient's daughter Brooke Kluver was called and notified about the results of the CT scan. She requested that the patient be mobilized by physical therapy and lifted in a hoyor lift. She also recommended placement of Dobbhoff tube, refused to have conversation with palliative care regarding future plans of how to go about feeding the patient. Patient's daughter kept counter questioning"why do you think  my mother does not need a feeding tube?" why do you think  my mother should transition to hospice." I repeated myself multiple times emphasizing that these were statements which were never made by me as I repeatedly told the daughter that we would do whatever she wants [place] to do. Nobody was disagreeing with her on any count. She was questioning things which were never said by anybody. This conversation was made in the presence of RN. Patient's daughter also stated that I have worked in [facility] and other [facility]. "You guys are a  small community hospital that has no perspective, of how things are done"  Patient's daughter requested a different hospitalist starting 4/24 All labs , radiology results were discussed with her prior to ending this conversation   Consultants: Neurology NS Palliative care  Procedures: none  Antibiotics: none  DVT prophylaxis: SCDs  Objective: Blood pressure 147/86, pulse 55, temperature 98.3 F (36.8 C), temperature source Axillary, resp. rate 20, height 5\' 5"  (1.651 m), weight 65 kg (143 lb 4.8 oz), SpO2 97 %. No intake or output data in  the 24 hours ending 07/31/15 1013 Exam: General: No acute respiratory distress evident  Lungs: Clear to auscultation bilaterally - no wheezes or crackles Cardiovascular: Regular rate and rhythm without murmur  Abdomen: Nontender, nondistended, soft, bowel sounds positive, no rebound, no ascites, no appreciable mass Extremities: No significant cyanosis, clubbing, edema bilateral lower extremities  Data Reviewed:  Basic Metabolic Panel:  Recent Labs Lab 07/25/15 1254 07/26/15 0420 07/28/15 0510 07/29/15 0646 07/29/15 1044 07/31/15 0544  NA 142 143 141 140  --  142  K 4.2 3.5 3.5 3.6  --  3.7  CL 107 107 108 107  --  109  CO2 26 24 22 22   --  23  GLUCOSE 116* 116* 116* 113*  --  117*  BUN 18 12 10 14   --  23*  CREATININE 1.14* 0.98 0.88 0.79  --  0.80  CALCIUM 9.1 8.5* 8.5* 8.6*  --  8.8*  MG  --   --   --   --  2.1  --     CBC:  Recent Labs Lab 07/25/15 1254 07/28/15 0510 07/31/15 0544  WBC 10.2 8.0 8.0  NEUTROABS 6.8  --   --   HGB 14.9 13.0 13.2  HCT 44.2 39.5 40.4  MCV 89.7 89.4 90.4  PLT 206 175 223    Liver Function Tests:  Recent Labs Lab 07/25/15 1254 07/26/15 0420 07/28/15 0510 07/31/15 0544  AST 45* 36 22 21  ALT 20 16 13* 15  ALKPHOS 85 72 59 64  BILITOT 1.3* 1.4* 0.8 0.8  PROT 7.4 6.0* 5.5* 5.8*  ALBUMIN 4.1 3.1* 2.5* 2.4*    Coags:  Recent Labs Lab 07/25/15 1932  INR 1.12    Recent Results (from the past 240 hour(s))  Urine culture     Status: None   Collection Time: 07/25/15  3:02 PM  Result Value Ref Range Status   Specimen Description URINE, CLEAN CATCH  Final   Special Requests NONE  Final   Culture   Final    NO GROWTH 1 DAY Performed at [facility]    Report Status 07/26/2015 FINAL  Final  MRSA PCR Screening     Status: None   Collection Time: 07/25/15  6:57 PM  Result Value Ref Range Status   MRSA by PCR NEGATIVE NEGATIVE Final    Comment:        The GeneXpert MRSA Assay (FDA approved for NASAL  specimens only), is one component of a comprehensive MRSA colonization surveillance program. It is not intended to diagnose MRSA infection nor to guide or monitor treatment for MRSA infections.      Studies:   Recent x-ray studies have been reviewed in detail by the Attending Physician  Scheduled Meds:  Scheduled Meds: . amLODipine  5 mg Oral Daily  . cloNIDine  0.2 mg Transdermal Weekly  . feeding supplement (ENSURE ENLIVE)  237 mL Oral BID BM  . labetalol  100 mg Oral TID  . levothyroxine  50 mcg Oral QAC breakfast    Time spent on care of this patient: 16 mins   Reyne Dumas , MD   [facility] Office  [phone] Pager - Text Page per Shea Evans as per below:  On-Call/Text Page:      [email or web address]      password [email or web address]  If 7PM-7AM, please contact night-coverage [email or web address] Password [email or web address] 07/31/2015, 10:13 AM   LOS: 6 days

## 2015-07-31 NOTE — Progress Notes (Signed)
Patient's SBP to stay below 140 per order using PRN med(see mar), last VS heart rate is below 60, notified provider for further instruction if to continue with PRN beta-blocker. Will continue to monitor.

## 2015-07-31 NOTE — Progress Notes (Signed)
Attempted to feed patient this morning, she did not open her mouth, husband at the bedside and he also tried to talk to her and feed her without success--will continue to monitor.

## 2015-07-31 NOTE — Progress Notes (Signed)
Patient daughter called and provided password in the chart. She wanted an update on the patient. RN explained that there were no changes at this time. The same plan of care being followed right now. Patient resting quietly in bed without acute distress. Bed alarm on for safety. Will continue to monitor.

## 2015-07-31 NOTE — Progress Notes (Signed)
Patient's daughter has called several times during the shift to speak with staff, with instructions on how she wants her mother cared for. On one occasion, daughter asked that her mother should be "lifted out of the bed, shake her to wake up, make her sit in a chair and start therapy" Daughter is reminded of patient's cognitive status and encouraged her to talk to her father for confirmation, she is reminded that this is not a safe approach. -Daughter requested labs/tests results which RN is unable to give her over the phone per policy, encouraged her to speak with the doctor. Daughter started shouting stating "my mother isn't getting great care from qualified professionals, you dont know that you are doing, you are a small hospital and you know nothing". Patient's daughter informed RN that she works for a TEFL teacher of [employer]". RN encouraged daughter to wait for the doctor to come in and she will be called for update.   Doctor talked to patient's daughter over the phone with RN present and patient's husband present, daughter was shouting at doctor over the phone as RN could hear patient's daughter over the phone, the phone conversation lasted about 32mins.

## 2015-07-31 NOTE — Progress Notes (Signed)
Additionally, daughter called and wanted the Rn to give out patient test results by phone. RN unable to provide telephone test results per policy. Encouraged daughter to call in the morning to speak with the physician.

## 2015-08-01 ENCOUNTER — Inpatient Hospital Stay (HOSPITAL_COMMUNITY): Payer: Medicare Other

## 2015-08-01 LAB — CBC
HCT: 41.6 % (ref 36.0–46.0)
Hemoglobin: 13.6 g/dL (ref 12.0–15.0)
MCH: 29.9 pg (ref 26.0–34.0)
MCHC: 32.7 g/dL (ref 30.0–36.0)
MCV: 91.4 fL (ref 78.0–100.0)
PLATELETS: 238 10*3/uL (ref 150–400)
RBC: 4.55 MIL/uL (ref 3.87–5.11)
RDW: 14.1 % (ref 11.5–15.5)
WBC: 9.9 10*3/uL (ref 4.0–10.5)

## 2015-08-01 LAB — COMPREHENSIVE METABOLIC PANEL
ALBUMIN: 2.6 g/dL — AB (ref 3.5–5.0)
ALK PHOS: 62 U/L (ref 38–126)
ALT: 13 U/L — AB (ref 14–54)
AST: 22 U/L (ref 15–41)
Anion gap: 10 (ref 5–15)
BILIRUBIN TOTAL: 0.9 mg/dL (ref 0.3–1.2)
BUN: 29 mg/dL — AB (ref 6–20)
CALCIUM: 8.7 mg/dL — AB (ref 8.9–10.3)
CO2: 23 mmol/L (ref 22–32)
CREATININE: 0.91 mg/dL (ref 0.44–1.00)
Chloride: 112 mmol/L — ABNORMAL HIGH (ref 101–111)
GFR calc Af Amer: >60 mL/min (ref >60)
GFR calc non Af Amer: 58 mL/min — ABNORMAL LOW (ref >60)
Glucose, Bld: 123 mg/dL — ABNORMAL HIGH (ref 65–99)
Potassium: 4 mmol/L (ref 3.5–5.1)
SODIUM: 145 mmol/L (ref 135–145)
Total Protein: 5.7 g/dL — ABNORMAL LOW (ref 6.5–8.1)

## 2015-08-01 LAB — GLUCOSE, CAPILLARY
GLUCOSE-CAPILLARY: 132 mg/dL — AB (ref 65–99)
GLUCOSE-CAPILLARY: 129 mg/dL — AB (ref 65–99)

## 2015-08-01 MED ORDER — JEVITY 1.2 CAL PO LIQD
1000.0000 mL | ORAL | Status: DC
  Administered 2015-08-01 – 2015-08-05 (×4): 1000 mL
  Filled 2015-08-01 (×4): qty 1000
  Filled 2015-08-01: qty 237
  Filled 2015-08-01 (×3): qty 1000

## 2015-08-01 MED ORDER — PRO-STAT SUGAR FREE PO LIQD
30.0000 mL | Freq: Every day | ORAL | Status: DC
  Administered 2015-08-01 – 2015-08-05 (×4): 30 mL
  Filled 2015-08-01 (×4): qty 30

## 2015-08-01 NOTE — Progress Notes (Signed)
Nutrition Follow-up  DOCUMENTATION CODES:   Non-severe (moderate) malnutrition in context of chronic illness  INTERVENTION:  Initiate Jevity 1.2 @ 20 ml/hr via Cortrak NGT and increase by 10 ml every 4 hours to goal rate of 50 ml/hr.   30 ml Prostat once daily.    Tube feeding regimen provides 1540 kcal (100% of needs), 82 grams of protein, and 968 ml of H2O.   Recommend providing 90 ml free water flushes every 4 hours to provide an additional 540 ml of water daily.    NUTRITION DIAGNOSIS:   Inadequate oral intake related to lethargy/confusion as evidenced by per patient/family report.  Ongoing  GOAL:   Patient will meet greater than or equal to 90% of their needs  Unmet  MONITOR:   PO intake, Supplement acceptance, Labs, Skin, I & O's, Weight trends  REASON FOR ASSESSMENT:   Consult Enteral/tube feeding initiation and management  ASSESSMENT:   82 yo female admitted on 4/17 s/p fall with ICH.   Pt has continued to not eat or drink. She is now NPO and has Cortak NG tube in place. RD consulted for TF initiation and management. Palliative care has been consulted- "likely anticipate family meeting 4-25".   Labs reviewed.   Diet Order:  Diet NPO time specified  Skin:  Wound (see comment) (blister to left heel)  Last BM:  4/23  Height:   Ht Readings from Last 1 Encounters:  07/25/15 5\' 5"  (1.651 m)    Weight:   Wt Readings from Last 1 Encounters:  07/25/15 143 lb 4.8 oz (65 kg)    Ideal Body Weight:  56.8 kg  BMI:  Body mass index is 23.85 kg/(m^2).  Estimated Nutritional Needs:   Kcal:  1400-1600  Protein:  80-90 gm  Fluid:  >/= 1.5 L  EDUCATION NEEDS:   No education needs identified at this time  [place], LDN Inpatient Clinical Dietitian Pager: [phone] After Hours Pager: [phone]

## 2015-08-01 NOTE — Progress Notes (Signed)
Daily Progress Note   Patient Name: Brooke Watson       Date: 08/01/2015 DOB: 1933/06/20  Age: 82 y.o. MRN#: [id number] Attending Physician: Florencia Reasons, MD Primary Care Physician: Gilford Rile, MD Admit Date: 07/25/2015  Reason for Consultation/Follow-up: Establishing goals of care and Psychosocial/spiritual support  Subjective:   This NP Wadie Lessen reviewed medical records, received report from team, assessed the patient and then conversation today with husband at the bedside and daughter Brooke Watson by telephone regarding diagnosis, prognosis, [place], EOL wishes disposition and options.  The diagnosis of dementia was discussed, its natural trajectory detailed  Brooke Watson is adamant that this is not her mother's baseline, she believes it is something  Acute.  However going back in the notes patient was seen over a year ago at the neurologist for continued cognitive decline with behavior issues  A discussion was had today regarding advanced directives.  Concepts specific to code status, artifical feeding and hydration, continued IV antibiotics and rehospitalization was had.  The difference between a aggressive medical intervention path  and a palliative comfort care path for this patient at this time was had.  Values and goals of care important to patient and family were attempted to be elicited.  Questions and concerns addressed.   Family encouraged to call with questions or concerns.  PMT will continue to support holistically.   Interval Events:  Doboff tube placed today and tube feeds itiaited    Length of Stay: 7 days  Current Medications: Scheduled Meds:  . amLODipine  5 mg Oral Daily  . cloNIDine  0.3 mg Transdermal Weekly  . feeding supplement (ENSURE ENLIVE)  237 mL Oral BID BM  .  labetalol  100 mg Oral TID  . levothyroxine  50 mcg Oral QAC breakfast    Continuous Infusions: . dextrose 5 % and 0.9 % NaCl with KCl 20 mEq/L 30 mL/hr at 07/31/15 1804    PRN Meds: acetaminophen **OR** acetaminophen, artificial tears, labetalol  Physical Exam: Physical Exam  Constitutional: She appears lethargic. She appears cachectic. She appears ill.  - opens eyes to name, unable to follow commands, non verbal  Cardiovascular: Normal rate, regular rhythm and normal heart sounds.   Pulmonary/Chest: She has decreased breath sounds in the right lower field and the left lower field.  Neurological: She appears lethargic.  Skin: Skin is warm and dry.                Vital Signs: BP 139/96 mmHg  Pulse 66  Temp(Src) 98 F (36.7 C) (Oral)  Resp 20  Ht 5\' 5"  (1.651 m)  Wt 65 kg (143 lb 4.8 oz)  BMI 23.85 kg/m2  SpO2 98% SpO2: SpO2: 98 % O2 Device: O2 Device: Not Delivered O2 Flow Rate:    Intake/output summary: No intake or output data in the 24 hours ending 08/01/15 1310 LBM: Last BM Date: 07/31/15 Baseline Weight: Weight: 65 kg (143 lb 4.8 oz) Most recent weight: Weight: 65 kg (143 lb 4.8 oz)       Palliative Assessment/Data: Flowsheet Rows        Most Recent Value   Intake Tab    Referral Department  Hospitalist   Unit at Time of Referral  Med/Surg Unit   Palliative Care Primary Diagnosis  Neurology   Palliative Care Type  New Palliative care   Reason for referral  Clarify Goals of Care   Date first seen by Palliative Care  07/31/15   Clinical Assessment    Palliative Performance Scale Score  30%   Pain Max last 24 hours  4   Pain Min Last 24 hours  3   Dyspnea Max Last 24 Hours  4   Dyspnea Min Last 24 hours  3   Psychosocial & Spiritual Assessment    Palliative Care Outcomes    Patient/Family meeting held?  Yes   Who was at the meeting?  husband at the bedside, daughter over the phone.    Palliative Care Outcomes  Clarified goals of care   Palliative Care  follow-up planned  Yes, Facility      Additional Data Reviewed: CBC    Component Value Date/Time   WBC 9.9 08/01/2015 0352   WBC 7.1 07/14/2015 1133   RBC 4.55 08/01/2015 0352   RBC 4.82 07/14/2015 1133   HGB 13.6 08/01/2015 0352   HCT 41.6 08/01/2015 0352   HCT 43.3 07/14/2015 1133   PLT 238 08/01/2015 0352   PLT 189 07/14/2015 1133   MCV 91.4 08/01/2015 0352   MCV 90 07/14/2015 1133   MCH 29.9 08/01/2015 0352   MCH 29.7 07/14/2015 1133   MCHC 32.7 08/01/2015 0352   MCHC 33.0 07/14/2015 1133   RDW 14.1 08/01/2015 0352   RDW 14.8 07/14/2015 1133   LYMPHSABS 2.1 07/25/2015 1254   MONOABS 1.3* 07/25/2015 1254   EOSABS 0.0 07/25/2015 1254   BASOSABS 0.0 07/25/2015 1254    CMP     Component Value Date/Time   NA 145 08/01/2015 0352   NA 142 07/14/2015 1133   K 4.0 08/01/2015 0352   CL 112* 08/01/2015 0352   CO2 23 08/01/2015 0352   GLUCOSE 123* 08/01/2015 0352   GLUCOSE 83 07/14/2015 1133   BUN 29* 08/01/2015 0352   BUN 15 07/14/2015 1133   CREATININE 0.91 08/01/2015 0352   CALCIUM 8.7* 08/01/2015 0352   PROT 5.7* 08/01/2015 0352   PROT 6.2 07/14/2015 1133   ALBUMIN 2.6* 08/01/2015 0352   ALBUMIN 4.0 07/14/2015 1133   AST 22 08/01/2015 0352   ALT 13* 08/01/2015 0352   ALKPHOS 62 08/01/2015 0352   BILITOT 0.9 08/01/2015 0352   BILITOT 0.5 07/14/2015 1133   GFRNONAA 58* 08/01/2015 0352   GFRAA >60 08/01/2015 0352       Problem List:  Patient Active Problem List   Diagnosis Date Noted  .  Encounter for palliative care   . Goals of care, counseling/discussion   . Palliative care encounter   . Parkinson disease ([place])   . SDH (subdural hematoma) ([place])   . Malnutrition of moderate degree 07/27/2015  . ICH (intracerebral hemorrhage) ([place])   . Nontraumatic cortical hemorrhage of right cerebral hemisphere ([place])   . Other specified hypothyroidism   . Dehydration 07/25/2015  . Acute cerebral hemorrhage ([place]) 07/25/2015  . Accelerated hypertension 07/25/2015    . Acute encephalopathy 06/13/2015  . Aggressive behavior 09/05/2014  . Dementia with behavioral disturbance 07/01/2014  . Delusions ([place]) 07/01/2014  . RLS (restless legs syndrome) 02/16/2014  . Cognitive decline 11/02/2013     Palliative Care Assessment & Plan    Code Status:  DNR-documented today    Code Status Orders        Start     Ordered   08/01/15 1149  Do not attempt resuscitation (DNR)   Continuous    Question Answer Comment  In the event of cardiac or respiratory ARREST Do not call a "code blue"   In the event of cardiac or respiratory ARREST Do not perform Intubation, CPR, defibrillation or ACLS   In the event of cardiac or respiratory ARREST Use medication by any route, position, wound care, and other measures to relive pain and suffering. May use oxygen, suction and manual treatment of airway obstruction as needed for comfort.      08/01/15 1148    Code Status History    Date Active Date Inactive Code Status Order ID Comments User Context   07/25/2015  6:59 PM 08/01/2015 11:48 AM Full Code [id number]  Lily Kocher, MD Inpatient   06/14/2015 12:04 AM 06/15/2015  7:57 PM Full Code [id number]  Etta Quill, DO ED        Goals of Care/Additional Recommendations:   Family is open to all offered and available medical interventions to prolong life    Palliative Prophylaxis:   Aspiration, Bowel Regimen, Delirium Protocol, Frequent Pain Assessment, Oral Care and Turn Reposition   Prognosis:  Pending desire for life prolonging intervetnions   Discharge Planning:   Pending outcomes   Care plan was discussed with Dr Erlinda Hong  Thank you for allowing the Palliative Medicine Team to assist in the care of this patient.   Time In: 0900 Time Out: 0935 Total Time 35 min Prolonged Time Billed  no         Knox Royalty, NP  08/01/2015, 1:10 PM  Please contact Palliative Medicine Team phone at [phone] for questions and concerns.

## 2015-08-01 NOTE — Progress Notes (Signed)
Physical Therapy Treatment Patient Details Name: Brooke Watson MRN: [id number] DOB: 12-Jan-1934 Today's Date: 08/01/2015    History of Present Illness 82 y.o. female patient with h/o dementia who presents with multiple intracranial bleeds s/p fall. Repeat CT shows new small left sided hygroma with right sided 2-3 mm shift    PT Comments    Able to assist pt up to chair this session, however seems to have less interaction visually or with following commands.  Feel she needs OOB, but likely safest with at least [place] with nursing assist.  Will continue skilled PT in the acute setting to maximize mobility prior to d/c to SNF.  Follow Up Recommendations  SNF;Supervision/Assistance - 24 hour     Equipment Recommendations  Wheelchair (measurements PT);Wheelchair cushion (measurements PT);Hospital bed (hoyer lift if d/c home)    Recommendations for Other Services       Precautions / Restrictions Precautions Precautions: Fall Precaution Comments: PEG, urinary incontinence    Mobility  Bed Mobility Overal bed mobility: Needs Assistance Bed Mobility: Rolling;Sidelying to Sit Rolling: +2 for physical assistance;Total assist Sidelying to sit: Max assist;+2 for physical assistance       General bed mobility comments: assisted to roll guiding hand to rail, pt able to hold on but not assist with rolling; assisted with hygiene while in sidelying due to urinary incontinence, then assisted legs off bed and lifted trunk upright  Transfers Overall transfer level: Needs assistance   Transfers: Squat Pivot Transfers     Squat pivot transfers: +2 physical assistance;Max assist     General transfer comment: assisted to lean trunk forward and blocked knees to pivot to chair with assist to guide hips  Ambulation/Gait                 Stairs            Wheelchair Mobility    Modified Rankin (Stroke Patients Only)       Balance Overall balance  assessment: Needs assistance Sitting-balance support: Feet supported;Single extremity supported Sitting balance-Leahy Scale: Poor Sitting balance - Comments: min assist at times mod for sitting balance due to leaning posterior       Standing balance comment: NT                    Cognition Arousal/Alertness: Lethargic Behavior During Therapy: Flat affect Overall Cognitive Status: Impaired/Different from baseline Area of Impairment: Attention;Following commands   Current Attention Level: Focused   Following Commands:  (does not follow commands)       General Comments: aroused with mobility in the bed, did not follow commands or make eye contact unless directly in front of pt; mumbling throughout tx with occasional appropriate words such as responding to current condition, pain, etc.    Exercises General Exercises - Upper Extremity Shoulder Flexion: AAROM;Both;5 reps;Supine General Exercises - Lower Extremity Heel Slides: PROM;Both;5 reps;Supine    General Comments General comments (skin integrity, edema, etc.): Spoke at length with daughter about pt's participation this session as well as PT standard/plan of care and our communication with nursing.  Noted tremors in UE throughout session as well as rigidity in neck, trunk and LE's.  Attempted to engage in visual/reaching activity without pt initiation or participation      Pertinent Vitals/Pain Faces Pain Scale: Hurts little more Pain Location: neck with ROM Pain Descriptors / Indicators: Grimacing Pain Intervention(s): Monitored during session;Repositioned    Home Living  Prior Function            PT Goals (current goals can now be found in the care plan section) Progress towards PT goals: Progressing toward goals    Frequency  Min 3X/week    PT Plan Current plan remains appropriate    Co-evaluation             End of Session   Activity Tolerance: Patient limited by  lethargy Patient left: in chair;with call bell/phone within reach;with family/visitor present;with chair alarm set     Time: 1520-1550 PT Time Calculation (min) (ACUTE ONLY): 30 min  Charges:  $Therapeutic Exercise: 8-22 mins $Therapeutic Activity: 8-22 mins                    G Codes:      Reginia Naas 2015/08/28, 4:32 PM  Magda Kiel, [place] 08/28/15

## 2015-08-01 NOTE — Progress Notes (Signed)
Brooke Watson [id number] DOB: 02-14-1934 DOA: 07/25/2015 PCP: Gilford Rile, MD   Admit HPI / Brief Narrative: 82 y.o. F Hx Dementia, TIA, HTN, HLD, and Hypothyroidism who was brought to the ED by her husband for evaluation of progressive lethargy, decreased oral intake, and gait disturbance since a recent fall. The patient was admitted in March for dehydration. She had an extensive work-up then that was fairly unremarkable. There were ongoing discussions about whether dose adjustments needed to be made on some of her neuropsych medications.  CT scan in the ED showed acute cerebral hemorrhage,multiple intracranial bleed S/P fall. Neurology and NS were both consulted.    HPI/Subjective:  open eyes intermittently, mumbles a few words, does not follow commands, not open mouth except when yawning Husband in room  Assessment/Plan:  Acute IntraCerebral Hemorrhage / Acute Encephalopathy with baseline progressive dementia -unable to determine clinically if associated w/ falls or not  -Neurosurgery does not feel intervention is warranted -EEG showed encephalopathy -hold depakote for now (was on for dementia/behavioral issue prior) in setting of elevated ammonia -B12 and folate normal  Neurology recommended Sinemet for possible Parkinson's disease , but patient's daughter declined  Repeat ct head per daughter's request which did not show no interval changes on 4/22 Neurology Ram Fuller Mandril, MD has personal spoken to Brooke Watson , The patient's daughter and explained to her the results of workup Palliative care input appreciated.  Elevated ammonia -likely contributing to acute encephalopathy, 72> 31 -can be related to depakote use - stop depakote - tx short term w/ lactulose - follow   Accelerated HTN -BP improving - goal <160  Dementia, progressive -family confirms she is conversant at baseline, though orientation waxes/wanes , husband reports that patient does not  recognized him intermittently in the last few months   Mildly abnormal LFTs  -resolved  Hypothyroidism -TSH WNL, though not at goal of 1.0 - cont usual synthroid dose   Non-severe (moderate) malnutrition in context of chronic illness Diet resumed today - however patient not following commands Patient does not open her mouth except when she yawns, per duaghter's request dobhoff placed on 4/24 and tube feeds initiated,   Code Status: DNR, confirmed with patient's daughter Brooke Watson over the phone and husband in person Family Communication:  patient's daughter Brooke Watson bobo on 4/24 and husband in room Disposition Plan:  SNF, palliative care consultation   I have talked to patient's husband in room and patient 34 daughter over the phone together with patient's husband for a prolonged period.  Per husband, patient stopped cooking a year ago, but she was able to do microwave for a few months, later, her husband have to feed her  Patient was able to walk independently a few months ago, but recently has not been able to walk much and she need assistance to do so,  Patient was not able to take a shower or a bath herself, per husband, patient's sister comes in to help her taking bath, Patient was admitted for progressive weakness and dehydration in 06/2015, per husband, patient started to fall in the last two weeks.  i have also reviewed patient's past medical records dating back to 2015, per outpatient neurology note , patient has progressive dementia, neurology has suggested memory care placement multiple times in the past.  I also discussed with Daughter Brooke Watson over the phone with the presence of patient's husband about code status, both daughter and patient's husband confirmed patient to be DNR status.  Daughter Brooke Watson who lives in [place] who thinks patient 's decline is from intracranial hemorrhage, she wants dobhoff placed and patient to sent to rehab , she hopes that patient is able to recover and  able to be more functional, I suggested that dobhoff is only a temporary measures, I have encourage the daughter to think about peg tube placement if patient does not improve in the setting of progressive dementia which she currently does not want to discuss.    Plan for rehab/SNF placement if bed available.    Consultants: Neurology NS Palliative care  Procedures: none  Antibiotics: none  DVT prophylaxis: SCDs  Objective: Blood pressure 139/96, pulse 66, temperature 98 F (36.7 C), temperature source Oral, resp. rate 20, height 5\' 5"  (1.651 m), weight 65 kg (143 lb 4.8 oz), SpO2 98 %. No intake or output data in the 24 hours ending 08/01/15 1238 Exam: General: frail, open eyes occasionally, mumbles a few words some times, not following commands Lungs: Clear to auscultation bilaterally - no wheezes or crackles Cardiovascular: Regular rate and rhythm without murmur  Abdomen: Nontender, nondistended, soft, bowel sounds positive, no rebound, no ascites, no appreciable mass Extremities: No significant cyanosis, clubbing, edema bilateral lower extremities  Data Reviewed:  Basic Metabolic Panel:  Recent Labs Lab 07/26/15 0420 07/28/15 0510 07/29/15 0646 07/29/15 1044 07/31/15 0544 08/01/15 0352  NA 143 141 140  --  142 145  K 3.5 3.5 3.6  --  3.7 4.0  CL 107 108 107  --  109 112*  CO2 24 22 22   --  23 23  GLUCOSE 116* 116* 113*  --  117* 123*  BUN 12 10 14   --  23* 29*  CREATININE 0.98 0.88 0.79  --  0.80 0.91  CALCIUM 8.5* 8.5* 8.6*  --  8.8* 8.7*  MG  --   --   --  2.1  --   --     CBC:  Recent Labs Lab 07/25/15 1254 07/28/15 0510 07/31/15 0544 08/01/15 0352  WBC 10.2 8.0 8.0 9.9  NEUTROABS 6.8  --   --   --   HGB 14.9 13.0 13.2 13.6  HCT 44.2 39.5 40.4 41.6  MCV 89.7 89.4 90.4 91.4  PLT 206 175 223 238    Liver Function Tests:  Recent Labs Lab 07/25/15 1254 07/26/15 0420 07/28/15 0510 07/31/15 0544 08/01/15 0352  AST 45* 36 22 21 22   ALT  20 16 13* 15 13*  ALKPHOS 85 72 59 64 62  BILITOT 1.3* 1.4* 0.8 0.8 0.9  PROT 7.4 6.0* 5.5* 5.8* 5.7*  ALBUMIN 4.1 3.1* 2.5* 2.4* 2.6*    Coags:  Recent Labs Lab 07/25/15 1932  INR 1.12    Recent Results (from the past 240 hour(s))  Urine culture     Status: None   Collection Time: 07/25/15  3:02 PM  Result Value Ref Range Status   Specimen Description URINE, CLEAN CATCH  Final   Special Requests NONE  Final   Culture   Final    NO GROWTH 1 DAY Performed at [facility]    Report Status 07/26/2015 FINAL  Final  MRSA PCR Screening     Status: None   Collection Time: 07/25/15  6:57 PM  Result Value Ref Range Status   MRSA by PCR NEGATIVE NEGATIVE Final    Comment:        The GeneXpert MRSA Assay (FDA approved for NASAL specimens only), is one component of a comprehensive  MRSA colonization surveillance program. It is not intended to diagnose MRSA infection nor to guide or monitor treatment for MRSA infections.      Studies:   Recent x-ray studies have been reviewed in detail by the Attending Physician  Scheduled Meds:  Scheduled Meds: . amLODipine  5 mg Oral Daily  . cloNIDine  0.3 mg Transdermal Weekly  . feeding supplement (ENSURE ENLIVE)  237 mL Oral BID BM  . labetalol  100 mg Oral TID  . levothyroxine  50 mcg Oral QAC breakfast    Time spent on care of this patient: 51 mins   Cintia Gleed , MD PhD [place]  [phone] Pager - Text Page per Amion as per below:  On-Call/Text Page:      [email or web address]      password [email or web address]  If 7PM-7AM, please contact night-coverage [email or web address] Password [facility] 08/01/2015, 12:38 PM   LOS: 7 days

## 2015-08-01 NOTE — Care Management Note (Signed)
Case Management Note  Patient Details  Name: Brooke Watson MRN: [id number] Date of Birth: Aug 21, 1933  Subjective/Objective:                    Action/Plan: Patient had cortrak tube placed today. CM spoke with patients daughter and she is speaking with a friend in the area to assist with SNF placement. CM following for discharge needs.   Expected Discharge Date:   (UNKNOWN)               Expected Discharge Plan:  [place]  In-House Referral:  Clinical Social Work  Discharge planning Services  CM Consult  Post Acute Care Choice:    Choice offered to:     DME Arranged:    DME Agency:     HH Arranged:    [place] Agency:     Status of Service:  In process, will continue to follow  Medicare Important Message Given:  Yes Date Medicare IM Given:    Medicare IM give by:    Date Additional Medicare IM Given:    Additional Medicare Important Message give by:     If discussed at [place] of Stay Meetings, dates discussed:    Additional Comments:  Pollie Friar, RN 08/01/2015, 2:50 PM

## 2015-08-02 ENCOUNTER — Encounter (HOSPITAL_COMMUNITY): Payer: Self-pay | Admitting: Radiology

## 2015-08-02 DIAGNOSIS — I6200 Nontraumatic subdural hemorrhage, unspecified: Secondary | ICD-10-CM

## 2015-08-02 DIAGNOSIS — Z66 Do not resuscitate: Secondary | ICD-10-CM | POA: Diagnosis present

## 2015-08-02 DIAGNOSIS — R531 Weakness: Secondary | ICD-10-CM

## 2015-08-02 LAB — GLUCOSE, CAPILLARY
Glucose-Capillary: 121 mg/dL — ABNORMAL HIGH (ref 65–99)
Glucose-Capillary: 124 mg/dL — ABNORMAL HIGH (ref 65–99)
Glucose-Capillary: 139 mg/dL — ABNORMAL HIGH (ref 65–99)
Glucose-Capillary: 130 mg/dL — ABNORMAL HIGH (ref 65–99)
Glucose-Capillary: 129 mg/dL — ABNORMAL HIGH (ref 65–99)
Glucose-Capillary: 125 mg/dL — ABNORMAL HIGH (ref 65–99)

## 2015-08-02 MED ORDER — CETYLPYRIDINIUM CHLORIDE 0.05 % MT LIQD
7.0000 mL | Freq: Two times a day (BID) | OROMUCOSAL | Status: DC
  Administered 2015-08-02 – 2015-08-05 (×6): 7 mL via OROMUCOSAL

## 2015-08-02 MED ORDER — CHLORHEXIDINE GLUCONATE 0.12 % MT SOLN
15.0000 mL | Freq: Two times a day (BID) | OROMUCOSAL | Status: DC
  Administered 2015-08-02 – 2015-08-05 (×7): 15 mL via OROMUCOSAL
  Filled 2015-08-02 (×6): qty 15

## 2015-08-02 NOTE — Progress Notes (Signed)
Daily Progress Note   Patient Name: Brooke Watson       Date: 08/02/2015 DOB: [birth year]  Age: 82 y.o. MRN#: [id number] Attending Physician: Florencia Reasons, MD Primary Care Physician: Gilford Rile, MD Admit Date: 07/25/2015  Reason for Consultation/Follow-up: Establishing goals of care and Psychosocial/spiritual support  Subjective:   -continued conversation with daughter [name] by telephone, concepts specific to artifical feeding and hydration was had.  Detailed pro/cons of artifical feeding in dementia patients. Values and goals of care important to patient and family were attempted to be elicited.  -daughter has made decision for PEG placement for initiation of artificial feeding "to give her a chance"  Questions and concerns addressed.   Family encouraged to call with questions or concerns.  PMT will continue to support holistically.     Length of Stay: 8 days  Current Medications: Scheduled Meds:  . amLODipine  5 mg Oral Daily  . antiseptic oral rinse  7 mL Mouth Rinse q12n4p  . chlorhexidine  15 mL Mouth Rinse BID  . cloNIDine  0.3 mg Transdermal Weekly  . feeding supplement (PRO-STAT SUGAR FREE 64)  30 mL Per Tube Daily  . labetalol  100 mg Oral TID  . levothyroxine  50 mcg Oral QAC breakfast    Continuous Infusions: . dextrose 5 % and 0.9 % NaCl with KCl 20 mEq/L 30 mL/hr at 08/02/15 0351  . feeding supplement (JEVITY 1.2 CAL) 50 mL (08/02/15 0421)    PRN Meds: acetaminophen **OR** acetaminophen, artificial tears, labetalol  Physical Exam: Physical Exam  Constitutional: She appears lethargic. She appears cachectic. She appears ill.  - opens eyes to name, unable to follow commands, non verbal  Cardiovascular: Normal rate, regular rhythm and normal heart sounds.     Pulmonary/Chest: She has decreased breath sounds in the right lower field and the left lower field.  Neurological: She appears lethargic.  Skin: Skin is warm and dry.                Vital Signs: BP 165/81 mmHg  Pulse 75  Temp(Src) 98.7 F (37.1 C) (Oral)  Resp 20  Ht 5\' 5"  (1.651 m)  Wt 65 kg (143 lb 4.8 oz)  BMI 23.85 kg/m2  SpO2 97% SpO2: SpO2: 97 % O2 Device: O2 Device: Not Delivered O2 Flow Rate:  Intake/output summary: No intake or output data in the 24 hours ending 08/02/15 1500 LBM: Last BM Date: 08/01/15 Baseline Weight: Weight: 65 kg (143 lb 4.8 oz) Most recent weight: Weight: 65 kg (143 lb 4.8 oz)       Palliative Assessment/Data: Flowsheet Rows        Most Recent Value   Intake Tab    Referral Department  Hospitalist   Unit at Time of Referral  Med/Surg Unit   Palliative Care Primary Diagnosis  Neurology   Palliative Care Type  New Palliative care   Reason for referral  Clarify Goals of Care   Date first seen by Palliative Care  07/31/15   Clinical Assessment    Palliative Performance Scale Score  30%   Pain Max last 24 hours  4   Pain Min Last 24 hours  3   Dyspnea Max Last 24 Hours  4   Dyspnea Min Last 24 hours  3   Psychosocial & Spiritual Assessment    Palliative Care Outcomes    Patient/Family meeting held?  Yes   Who was at the meeting?  husband at the bedside, daughter over the phone.    Palliative Care Outcomes  Clarified goals of care   Palliative Care follow-up planned  Yes, Facility      Additional Data Reviewed: CBC    Component Value Date/Time   WBC 9.9 08/01/2015 0352   WBC 7.1 07/14/2015 1133   RBC 4.55 08/01/2015 0352   RBC 4.82 07/14/2015 1133   HGB 13.6 08/01/2015 0352   HCT 41.6 08/01/2015 0352   HCT 43.3 07/14/2015 1133   PLT 238 08/01/2015 0352   PLT 189 07/14/2015 1133   MCV 91.4 08/01/2015 0352   MCV 90 07/14/2015 1133   MCH 29.9 08/01/2015 0352   MCH 29.7 07/14/2015 1133   MCHC 32.7 08/01/2015 0352   MCHC 33.0  07/14/2015 1133   RDW 14.1 08/01/2015 0352   RDW 14.8 07/14/2015 1133   LYMPHSABS 2.1 07/25/2015 1254   MONOABS 1.3* 07/25/2015 1254   EOSABS 0.0 07/25/2015 1254   BASOSABS 0.0 07/25/2015 1254    CMP     Component Value Date/Time   NA 145 08/01/2015 0352   NA 142 07/14/2015 1133   K 4.0 08/01/2015 0352   CL 112* 08/01/2015 0352   CO2 23 08/01/2015 0352   GLUCOSE 123* 08/01/2015 0352   GLUCOSE 83 07/14/2015 1133   BUN 29* 08/01/2015 0352   BUN 15 07/14/2015 1133   CREATININE 0.91 08/01/2015 0352   CALCIUM 8.7* 08/01/2015 0352   PROT 5.7* 08/01/2015 0352   PROT 6.2 07/14/2015 1133   ALBUMIN 2.6* 08/01/2015 0352   ALBUMIN 4.0 07/14/2015 1133   AST 22 08/01/2015 0352   ALT 13* 08/01/2015 0352   ALKPHOS 62 08/01/2015 0352   BILITOT 0.9 08/01/2015 0352   BILITOT 0.5 07/14/2015 1133   GFRNONAA 58* 08/01/2015 0352   GFRAA >60 08/01/2015 0352       Problem List:  Patient Active Problem List   Diagnosis Date Noted  . Encounter for palliative care   . Goals of care, counseling/discussion   . Palliative care encounter   . Parkinson disease ([place])   . SDH (subdural hematoma) ([place])   . Malnutrition of moderate degree 07/27/2015  . ICH (intracerebral hemorrhage) ([place])   . Nontraumatic cortical hemorrhage of right cerebral hemisphere ([place])   . Other specified hypothyroidism   . Dehydration 07/25/2015  . Acute cerebral hemorrhage ([place]) 07/25/2015  .  Accelerated hypertension 07/25/2015  . Acute encephalopathy 06/13/2015  . Aggressive behavior 09/05/2014  . Dementia with behavioral disturbance 07/01/2014  . Delusions ([place]) 07/01/2014  . RLS (restless legs syndrome) 02/16/2014  . Cognitive decline 11/02/2013     Palliative Care Assessment & Plan    Code Status:  DNR    Code Status Orders        Start     Ordered   08/01/15 1149  Do not attempt resuscitation (DNR)   Continuous    Question Answer Comment  In the event of cardiac or respiratory ARREST Do not call  a "code blue"   In the event of cardiac or respiratory ARREST Do not perform Intubation, CPR, defibrillation or ACLS   In the event of cardiac or respiratory ARREST Use medication by any route, position, wound care, and other measures to relive pain and suffering. May use oxygen, suction and manual treatment of airway obstruction as needed for comfort.      08/01/15 1148    Code Status History    Date Active Date Inactive Code Status Order ID Comments User Context   07/25/2015  6:59 PM 08/01/2015 11:48 AM Full Code [id number]  Lily Kocher, MD Inpatient   06/14/2015 12:04 AM 06/15/2015  7:57 PM Full Code [id number]  Etta Quill, DO ED        Goals of Care/Additional Recommendations:   Family is open to all offered and available medical interventions to prolong life    Palliative Prophylaxis:   Aspiration, Bowel Regimen, Delirium Protocol, Frequent Pain Assessment, Oral Care and Turn Reposition   Prognosis:  Pending desire for life prolonging intervetnions   Discharge Planning:   Pending outcomes   Care plan was discussed with Dr Erlinda Hong  Thank you for allowing the Palliative Medicine Team to assist in the care of this patient.   Time In: 1430 Time Out: 1500 Total Time 30 min Prolonged Time Billed  no         Knox Royalty, NP  08/02/2015, 3:00 PM  Please contact Palliative Medicine Team phone at [phone] for questions and concerns.

## 2015-08-02 NOTE — Progress Notes (Addendum)
TRH progress note    SAJDA TIER [id number] DOB: October 10, 1933 DOA: 07/25/2015 PCP: Gilford Rile, MD   Admit HPI / Brief Narrative: 82 y.o. F Hx Dementia, TIA, HTN, HLD, and Hypothyroidism who was brought to the ED by her husband for evaluation of progressive lethargy, decreased oral intake, and gait disturbance since a recent fall. The patient was admitted in March for dehydration. She had an extensive work-up then that was fairly unremarkable. There were ongoing discussions about whether dose adjustments needed to be made on some of her neuropsych medications.  CT scan in the ED showed acute cerebral hemorrhage,multiple intracranial bleed S/P fall. Neurology and NS were both consulted.    HPI/Subjective:  seems more alert this am, open eyes spontaneously, still very weak , does not move but seems to have attempted to follow command, talk more and speech is more clear, denies pain, oriented to person only, recognize her husband, but not able to states his name,  Husband in room  Assessment/Plan:  Acute IntraCerebral Hemorrhage / Acute Encephalopathy with baseline progressive dementia -unable to determine clinically if associated w/ falls or not  -Neurosurgery does not feel intervention is warranted -EEG showed encephalopathy -hold depakote for now (was on for dementia/behavioral issue prior) in setting of elevated ammonia -B12 and folate normal  Neurology recommended Sinemet for possible Parkinson's disease , but patient's daughter declined  Repeat ct head per daughter's request which did not show no interval changes on 4/22 Neurology Ram Fuller Mandril, MD has personal spoken to [name] , The patient's daughter and explained to her the results of workup Palliative care input appreciated. 4/25: seems more alert, tolerate tube feeds, patient is medically ready to be discharged to SNF, i have talked to husband and daughter [name] over the phone , they expressed  understanding.    Elevated ammonia -likely contributing to acute encephalopathy, 72> 31 -can be related to depakote use - stop depakote - tx short term w/ lactulose - follow   Accelerated HTN -BP improving - goal <160  Dementia, progressive -family confirms she is conversant at baseline, though orientation waxes/wanes , husband reports that patient does not recognized him intermittently in the last few months   Mildly abnormal LFTs  -resolved  Hypothyroidism -TSH WNL, though not at goal of 1.0 - cont usual synthroid dose   Non-severe (moderate) malnutrition in context of chronic illness Diet resumed today - however patient not following commands Patient does not open her mouth except when she yawns, per duaghter's request dobhoff placed on 4/24 and tube feeds initiated,   Code Status: DNR, confirmed with patient's daughter [name] over the phone and husband in person Family Communication:  patient's daughter [name] bobo over the phone daily and husband in room Disposition Plan:  SNF with palliative care consultation   I have talked to patient's husband in room and patient 's daughter over the phone together with patient's husband for a prolonged period.  Per husband, patient stopped cooking a year ago, but she was able to do microwave for a few months, later, her husband have to feed her  Patient was able to walk independently a few months ago, but recently has not been able to walk much and she need assistance to do so,  Patient was not able to take a shower or a bath herself, per husband, patient's sister comes in to help her taking bath, Patient was admitted for progressive weakness and dehydration in 06/2015, per husband, patient started to fall in  the last two weeks.  i have also reviewed patient's past medical records dating back to 2015, per outpatient neurology note , patient has progressive dementia, neurology has suggested memory care placement multiple times in the past.  On  4/24 I also discussed with Daughter [name] over the phone with the presence of patient's husband about code status, both daughter and patient's husband confirmed patient to be DNR status.   Daughter [name] who lives in [place] who thinks patient 's decline is from intracranial hemorrhage, she wants dobhoff placed and patient to sent to rehab , she hopes that patient is able to recover and able to be more functional, I suggested that dobhoff is only a temporary measures. But on 4/24 daughter does not want to discuss more that dobhoff placement.  On 4/25 daughter wishes to place peg tube,  I have discussed with her that peg tube placement will not prevent aspiration, patient will need to be wearing mittens likely at all time to prevent peg tube being pulled out.   Daughter is now open to discussion about hospice in the event that patient does not improved after peg tube placement.  Plan for SNF placement after peg tube placement,  Continue palliative care consult at SNF for possible transition to hospice in the event patient does not improve.    Consultants: Neurology NS Palliative care  Procedures: none  Antibiotics: none  DVT prophylaxis: SCDs  Objective: Blood pressure 141/85, pulse 54, temperature 98.5 F (36.9 C), temperature source Oral, resp. rate 20, height 5\' 5"  (1.651 m), weight 65 kg (143 lb 4.8 oz), SpO2 98 %. No intake or output data in the 24 hours ending 08/02/15 1056 Exam: General: frail, more alert today, recognize her husband, seems now attempt to  follow commands but too frail to do so Lungs: Clear to auscultation bilaterally - no wheezes or crackles Cardiovascular: Regular rate and rhythm without murmur  Abdomen: Nontender, nondistended, soft, bowel sounds positive, no rebound, no ascites, no appreciable mass Extremities: No significant cyanosis, clubbing, edema bilateral lower extremities  Data Reviewed:  Basic Metabolic Panel:  Recent Labs Lab  07/28/15 0510 07/29/15 0646 07/29/15 1044 07/31/15 0544 08/01/15 0352  NA 141 140  --  142 145  K 3.5 3.6  --  3.7 4.0  CL 108 107  --  109 112*  CO2 22 22  --  23 23  GLUCOSE 116* 113*  --  117* 123*  BUN 10 14  --  23* 29*  CREATININE 0.88 0.79  --  0.80 0.91  CALCIUM 8.5* 8.6*  --  8.8* 8.7*  MG  --   --  2.1  --   --     CBC:  Recent Labs Lab 07/28/15 0510 07/31/15 0544 08/01/15 0352  WBC 8.0 8.0 9.9  HGB 13.0 13.2 13.6  HCT 39.5 40.4 41.6  MCV 89.4 90.4 91.4  PLT 175 223 238    Liver Function Tests:  Recent Labs Lab 07/28/15 0510 07/31/15 0544 08/01/15 0352  AST 22 21 22   ALT 13* 15 13*  ALKPHOS 59 64 62  BILITOT 0.8 0.8 0.9  PROT 5.5* 5.8* 5.7*  ALBUMIN 2.5* 2.4* 2.6*    Coags: No results for input(s): INR in the last 168 hours.  Invalid input(s): PT  Recent Results (from the past 240 hour(s))  Urine culture     Status: None   Collection Time: 07/25/15  3:02 PM  Result Value Ref Range Status   Specimen Description URINE,  CLEAN CATCH  Final   Special Requests NONE  Final   Culture   Final    NO GROWTH 1 DAY Performed at [facility]    Report Status 07/26/2015 FINAL  Final  MRSA PCR Screening     Status: None   Collection Time: 07/25/15  6:57 PM  Result Value Ref Range Status   MRSA by PCR NEGATIVE NEGATIVE Final    Comment:        The GeneXpert MRSA Assay (FDA approved for NASAL specimens only), is one component of a comprehensive MRSA colonization surveillance program. It is not intended to diagnose MRSA infection nor to guide or monitor treatment for MRSA infections.      Studies:   Recent x-ray studies have been reviewed in detail by the Attending Physician  Scheduled Meds:  Scheduled Meds: . amLODipine  5 mg Oral Daily  . antiseptic oral rinse  7 mL Mouth Rinse q12n4p  . chlorhexidine  15 mL Mouth Rinse BID  . cloNIDine  0.3 mg Transdermal Weekly  . feeding supplement (PRO-STAT SUGAR FREE 64)  30 mL Per Tube  Daily  . labetalol  100 mg Oral TID  . levothyroxine  50 mcg Oral QAC breakfast    Time spent on care of this patient: 25 mins   Mellanie Bejarano , MD PhD [place]  [phone] Pager - Text Page per Amion as per below:  On-Call/Text Page:      [email or web address]      password [email or web address]  If 7PM-7AM, please contact night-coverage [email or web address] Password [facility] 08/02/2015, 10:56 AM   LOS: 8 days

## 2015-08-02 NOTE — Progress Notes (Signed)
Chief Complaint: Patient was seen in consultation today for gastrostomy tube placement at the request of Dr. Florencia Reasons  Referring Physician(s): Dr. Florencia Reasons  Supervising Physician: Markus Daft  Patient Status: In-pt  History of Present Illness: Brooke Watson is a 82 y.o. female with [place] IntraCerebral Hemorrhage / Acute Encephalopathy with baseline progressive dementia. She has progressive failure to thrive and protein calorie malnutrition as a result of the above. She is currently getting TF via [place] and tolerating it well. She is close to ready for discharge to SNF but will need G-tube placement. IR is asked to place this. Chart, PMHx, meds, labs, imaging reviewed. Husband at bedside.  Past Medical History  Diagnosis Date  . High cholesterol   . Cognitive decline   . Dementia   . Hypertension   . Thyroid disease   . UTI (urinary tract infection)   . Arthritis     Past Surgical History  Procedure Laterality Date  . Colon surgery      Allergies: Ambien and Amoxicillin  Medications:  Current facility-administered medications:  .  acetaminophen (TYLENOL) tablet 500 mg, 500 mg, Oral, Q6H PRN, 500 mg at 08/02/15 1524 **OR** acetaminophen (TYLENOL) suppository 650 mg, 650 mg, Rectal, Q6H PRN, Cherene Altes, MD .  amLODipine (NORVASC) tablet 5 mg, 5 mg, Oral, Daily, Cherene Altes, MD, 5 mg at 08/02/15 [id number] .  antiseptic oral rinse (CPC / CETYLPYRIDINIUM CHLORIDE 0.05%) solution 7 mL, 7 mL, Mouth Rinse, q12n4p, Florencia Reasons, MD, 7 mL at 08/02/15 1525 .  artificial tears (LACRILUBE) ophthalmic ointment, , Both Eyes, Q3H PRN, Cherene Altes, MD, 1 application at 0000000 1515 .  chlorhexidine (PERIDEX) 0.12 % solution 15 mL, 15 mL, Mouth Rinse, BID, Florencia Reasons, MD, 15 mL at 08/02/15 0920 .  cloNIDine (CATAPRES - Dosed in mg/24 hr) patch 0.3 mg, 0.3 mg, Transdermal, Weekly, Reyne Dumas, MD, 0.3 mg at 07/31/15 1336 .  dextrose 5 % and 0.9 % NaCl with KCl 20 mEq/L  infusion, , Intravenous, Continuous, Cherene Altes, MD, Last Rate: 30 mL/hr at 08/02/15 0351 .  feeding supplement (JEVITY 1.2 CAL) liquid 1,000 mL, 1,000 mL, Per Tube, Continuous, Florencia Reasons, MD, Last Rate: 50 mL/hr at 08/02/15 0421, 50 mL at 08/02/15 0421 .  feeding supplement (PRO-STAT SUGAR FREE 64) liquid 30 mL, 30 mL, Per Tube, Daily, Florencia Reasons, MD, 30 mL at 08/02/15 0920 .  labetalol (NORMODYNE) tablet 100 mg, 100 mg, Oral, TID, Cherene Altes, MD, 100 mg at 08/02/15 1524 .  labetalol (NORMODYNE,TRANDATE) injection 20 mg, 20 mg, Intravenous, Q4H PRN, Lily Kocher, MD, 20 mg at 08/01/15 0520 .  levothyroxine (SYNTHROID, LEVOTHROID) tablet 50 mcg, 50 mcg, Oral, QAC breakfast, Cherene Altes, MD, 50 mcg at 08/02/15 0920    Family History  Problem Relation Age of Onset  . Dementia Neg Hx     Social History   Social History  . Marital Status: Married    Spouse Name: Alveta Heimlich  . Number of Children: 1  . Years of Education: 12   Occupational History  . Retired [employer]   Social History Main Topics  . Smoking status: Former Research scientist (life sciences)  . Smokeless tobacco: Never Used  . Alcohol Use: No  . Drug Use: No  . Sexual Activity: No   Other Topics Concern  . None   Social History Narrative   Patient is married. Alveta Heimlich   Patient has a daughter.   Patient works with [employer] at [employer] at [employer]   Patient is retired.    Caffeine use: none      Review of Systems: A 12 point ROS discussed and pertinent positives are indicated in the HPI above.  All other systems are negative.  Review of Systems  Vital Signs: BP 165/81 mmHg  Pulse 75  Temp(Src) 98.7 F (37.1 C) (Oral)  Resp 20  Ht 5\' 5"  (1.651 m)  Wt 143 lb 4.8 oz (65 kg)  BMI 23.85 kg/m2  SpO2 97%  Physical Exam  Constitutional: She appears well-developed and well-nourished.  HENT:  Head: Normocephalic.  Mouth/Throat: Oropharynx is clear and moist.  Neck: Normal range of motion. No tracheal deviation  present.  Cardiovascular: Normal rate, regular rhythm and normal heart sounds.   Pulmonary/Chest: Effort normal and breath sounds normal. No respiratory distress.  Abdominal: Soft. She exhibits no mass. There is no tenderness.  Neurological:  Demented but pleasant    Mallampati Score:  MD Evaluation Airway: WNL Heart: WNL Abdomen: WNL Chest/ Lungs: WNL ASA  Classification: 4 Mallampati/Airway Score: Two    Labs:  CBC:  Recent Labs  07/25/15 1254 07/28/15 0510 07/31/15 0544 08/01/15 0352  WBC 10.2 8.0 8.0 9.9  HGB 14.9 13.0 13.2 13.6  HCT 44.2 39.5 40.4 41.6  PLT 206 175 223 238    COAGS:  Recent Labs  07/25/15 1932  INR 1.12    BMP:  Recent Labs  07/28/15 0510 07/29/15 0646 07/31/15 0544 08/01/15 0352  NA 141 140 142 145  K 3.5 3.6 3.7 4.0  CL 108 107 109 112*  CO2 22 22 23 23   GLUCOSE 116* 113* 117* 123*  BUN 10 14 23* 29*  CALCIUM 8.5* 8.6* 8.8* 8.7*  CREATININE 0.88 0.79 0.80 0.91  GFRNONAA >60 >60 >60 58*  GFRAA >60 >60 >60 >60    LIVER FUNCTION TESTS:  Recent Labs  07/26/15 0420 07/28/15 0510 07/31/15 0544 08/01/15 0352  BILITOT 1.4* 0.8 0.8 0.9  AST 36 22 21 22   ALT 16 13* 15 13*  ALKPHOS 72 59 64 62  PROT 6.0* 5.5* 5.8* 5.7*  ALBUMIN 3.1* 2.5* 2.4* 2.6*    TUMOR MARKERS: No results for input(s): AFPTM, CEA, CA199, CHROMGRNA in the last 8760 hours.  Assessment and Plan: Dysphagia, FTT and PCM secondary to acute IntraCerebral Hemorrhage / Acute Encephalopathy with baseline progressive dementia. Reviewed prior abd imaging with Dr. Anselm Pancoast, anatomically a candidate for percutaneous g-tube placement. WIll stop TF at [facility] and plan for attempt tomorrow. Risks and Benefits discussed with the patient's husband  including, but not limited to the need for a barium enema during the procedure, bleeding, infection, peritonitis, or damage to adjacent structures. All of the patient's questions were answered, patient's husband is agreeable to  proceed. Consent signed and in chart.   Thank you for this interesting consult.    A copy of this report was sent to the requesting provider on this date.  Electronically Signed: Ascencion Dike 08/02/2015, 4:26 PM   I spent a total of 20 Minutes  in face to face in clinical consultation, greater than 50% of which was counseling/coordinating care for gastrostomy tube placement

## 2015-08-02 NOTE — Clinical Social Work Note (Signed)
Clinical Social Work Assessment  Patient Details  Name: Brooke Watson MRN: [id number] Date of Birth: [birth year]  Date of referral:  08/02/15               Reason for consult:  Facility Placement, Discharge Planning                Permission sought to share information with:  Facility Sport and exercise psychologist, Family Supports Permission granted to share information::  Yes, Verbal Permission Granted  Name::     [place]::  [facility] (preference 1. [place], 2. [place])  Relationship::  Daughter  Contact Information:  [phone]  Housing/Transportation Living arrangements for the past 2 months:  [place] of Information:  Adult Children Patient Interpreter Needed:  None Criminal Activity/Legal Involvement Pertinent to Current Situation/Hospitalization:  No - Comment as needed Significant Relationships:  Adult Children Lives with:  Spouse Do you feel safe going back to the place where you live?  No Need for family participation in patient care:  Yes (Comment) (Patient's daughter active in patient's care.)  Care giving concerns:  Patient's daughter expressed no concerns at this time.   Social Worker assessment / plan:  CSW received referral for possible SNF placement at time of discharge. CSW spoke with patient's daughter regarding patient's discharge disposition. Per patient's daughter, family is agreeable to SNF placement and would prefer either [facility] or [place]. CSW to continue to follow and assist with discharge planning needs.  Employment status:  Retired Science writer) PT Recommendations:  [place] / Referral to community resources:  [place]  Patient/Family's Response to care:  Patient's daughter understanding and agreeable to CSW plan of care.  Patient/Family's Understanding of and Emotional Response to Diagnosis, Current Treatment, and  Prognosis:  Patient's daughter understanding and agreeable to CSW plan of care.  Emotional Assessment Appearance:  Appears stated age Attitude/Demeanor/Rapport:  Other (CSW spoke with patient's daughter.) Affect (typically observed):   (CSW spoke with patient's daughter.) Orientation:  Oriented to Self Alcohol / Substance use:  Not Applicable Psych involvement (Current and /or in the community):  No (Comment) (Not appropriate on this admission.)  Discharge Needs  Concerns to be addressed:  No discharge needs identified Readmission within the last 30 days:  No Current discharge risk:  None Barriers to Discharge:  No Barriers Identified   Caroline Sauger, LCSW 08/02/2015, 3:16 PM [phone]

## 2015-08-02 NOTE — Progress Notes (Addendum)
Speech Language Pathology Treatment: Dysphagia  Patient Details Name: Brooke Watson MRN: [id number] DOB: 06/21/1933 Today's Date: 08/02/2015 Time: [id number] SLP Time Calculation (min) (ACUTE ONLY): 16 min  Assessment / Plan / Recommendation Clinical Impression  Pt continues with significant oral dysphagia likely cognitive related baseline deficits with exacerbation from CVAs.  She currently has a short term feeding tube with tube feeding running.  Recommend continue npo except tsps water with pt fully alert, accepting and sitting upright.   Consumption of tsps of water will decrease disuse muscle atrophy an aid oral care.  Concern for pt's ability to meet nutritional needs is present given level of dysphagia.    Note palliative following pt for goals of care.  No family present to educate or obtain prior level of function.  SLP to follow up.     HPI HPI: 82 y.o. female patient with h/o dementia who presents with multiple intracranial bleeds s/p fall. Repeat CT shows new small left sided hygroma with right sided 2-3 mm shift.  Pt has a short term feeding tube placed and has been made npo due to her dysphagia.  Upon chart review, note pt's spouse was holding pt's nose closed to get her to open mouth to accept intake.  SLP follow up to determine readiness for po intake.        SLP Plan  Continue with current plan of care     Recommendations  Diet recommendations: Thin liquid (via tsp) Liquids provided via: Teaspoon Medication Administration: Via alternative means Supervision: Full supervision/cueing for compensatory strategies (pt did not make attempts to help self feed even with hand over hand assist) Compensations: Slow rate;Other (Comment) (observe swallow via laryngeal elevation observation prior to giving more ) Postural Changes and/or Swallow Maneuvers: Seated upright 90 degrees;Upright 30-60 min after meal             Oral Care Recommendations: Oral care BID Follow up  Recommendations: Skilled Nursing facility Plan: Continue with current plan of care     [place], [place] [facility] SLP [phone]

## 2015-08-02 NOTE — Progress Notes (Signed)
Ms [name] (patients daughter) called and spoke with CM about patients discharge disposition. She states she is in [place] and can not make it to [place] before Saturday. She has spoken to a friend in the area and she is requesting [facility] for pts SNF/REHAB. She also wanted information on SNF/REHAB that will take the patient with a nasal feeding tube. Information relayed to Raquel Sarna, [place] and she was given the daughters phone number so she could speak with her regarding discharge plans. CM will continue to follow for further d/c needs.

## 2015-08-02 NOTE — Progress Notes (Signed)
Physical Therapy Treatment Patient Details Name: Brooke Watson MRN: [id number] DOB: [birth year] Today's Date: 08/02/2015    History of Present Illness 82 y.o. female patient with h/o dementia who presents with multiple intracranial bleeds s/p fall. Repeat CT shows new small left sided hygroma with right sided 2-3 mm shift    PT Comments    Patient able to stand briefly with max to total A knees and hips flexed and no effort to reach for UE support.  Feel rigidity is causing increased L lateral head/neck positioning and with pt not making attempts to reposition herself is at high risk for contracture.  Patient positioned on R side in bed to allow gravity to pull head further to R.  Feel medical treatment for rigidity may also help and improve pt's ability to make voluntary movements.  Will continue skilled PT in the acute setting in prep for d/c to SNF level rehab.   Follow Up Recommendations  SNF;Supervision/Assistance - 24 hour     Equipment Recommendations  Wheelchair (measurements PT);Wheelchair cushion (measurements PT);Hospital bed (hoyer lift if d/c home)    Recommendations for Other Services       Precautions / Restrictions Precautions Precautions: Fall Precaution Comments: Panda, urinary incontinence    Mobility  Bed Mobility Overal bed mobility: Needs Assistance Bed Mobility: Sit to Sidelying;Rolling Rolling: Max assist;+2 for physical assistance       Sit to sidelying: Max assist;+2 for physical assistance General bed mobility comments: assist for trunk and legs to lie on side, then assist to roll and scoot to [facility]  Transfers Overall transfer level: Needs assistance Equipment used: 2 person hand held assist Transfers: Sit to/from [employer] Sit to Stand: Max assist;Total assist;+2 physical assistance   Squat pivot transfers: +2 physical assistance;Max assist     General transfer comment: assist to pivot from recliner to [facility] then assist to  stand but pt remains flexed at knees and hips and needed max A to stay standing (walker in front of her, but not reaching for UE support).  to move from [facility] to bed moved furniture around her to get her into bed  Ambulation/Gait                 Stairs            Wheelchair Mobility    Modified Rankin (Stroke Patients Only)       Balance Overall balance assessment: Needs assistance Sitting-balance support: Feet supported Sitting balance-Leahy Scale: Fair Sitting balance - Comments: placed pt's hands beside her for balance, but did not self support, leans to L with head/neck leaning L, when supported at head able to balance with min A, but unable to achieve midline head positionnig today after patient in chair 4 hours with head cocked to L  Postural control: Left lateral lean Standing balance support: No upper extremity supported Standing balance-Leahy Scale: Zero Standing balance comment: max to total A for standing                    Cognition Arousal/Alertness: Awake/alert Behavior During Therapy: Flat affect Overall Cognitive Status: Impaired/Different from baseline Area of Impairment: Attention;Following commands   Current Attention Level: Focused Memory: Decreased short-term memory Following Commands:  (Does not follow commands)       General Comments: eyes open, alert seated in chair initially, pt able to make eye contact when cued to look and target directly in front, but did not track laterally even with encouragement  Exercises Other Exercises Other Exercises: head and neck PROM wtih stretch to L lateral musculature    General Comments        Pertinent Vitals/Pain Faces Pain Scale: Hurts little more Pain Location: neck with ROM Pain Intervention(s): Monitored during session;Repositioned    Home Living                      Prior Function            PT Goals (current goals can now be found in the care plan section) Progress  towards PT goals: Progressing toward goals (limited)    Frequency  Min 3X/week    PT Plan Current plan remains appropriate    Co-evaluation             End of Session Equipment Utilized During Treatment: Gait belt Activity Tolerance: Patient limited by lethargy Patient left: in bed;with family/visitor present;with bed alarm set;with call bell/phone within reach     Time: 1415-1441 PT Time Calculation (min) (ACUTE ONLY): 26 min  Charges:  $Therapeutic Activity: 23-37 mins                    G CodesReginia Watson 08/13/15, 5:06 PM  Brooke Watson, Brooke Watson 08/13/2015

## 2015-08-03 ENCOUNTER — Inpatient Hospital Stay (HOSPITAL_COMMUNITY): Payer: Medicare Other

## 2015-08-03 DIAGNOSIS — F0391 Unspecified dementia with behavioral disturbance: Secondary | ICD-10-CM

## 2015-08-03 DIAGNOSIS — E860 Dehydration: Secondary | ICD-10-CM

## 2015-08-03 DIAGNOSIS — Z7189 Other specified counseling: Secondary | ICD-10-CM

## 2015-08-03 DIAGNOSIS — E038 Other specified hypothyroidism: Secondary | ICD-10-CM

## 2015-08-03 DIAGNOSIS — I10 Essential (primary) hypertension: Secondary | ICD-10-CM

## 2015-08-03 DIAGNOSIS — L8990 Pressure ulcer of unspecified site, unspecified stage: Secondary | ICD-10-CM | POA: Diagnosis present

## 2015-08-03 DIAGNOSIS — R1310 Dysphagia, unspecified: Secondary | ICD-10-CM

## 2015-08-03 DIAGNOSIS — S06369A Traumatic hemorrhage of cerebrum, unspecified, with loss of consciousness of unspecified duration, initial encounter: Secondary | ICD-10-CM

## 2015-08-03 DIAGNOSIS — G9340 Encephalopathy, unspecified: Secondary | ICD-10-CM

## 2015-08-03 DIAGNOSIS — Z66 Do not resuscitate: Secondary | ICD-10-CM

## 2015-08-03 DIAGNOSIS — R531 Weakness: Secondary | ICD-10-CM | POA: Diagnosis present

## 2015-08-03 LAB — BASIC METABOLIC PANEL
ANION GAP: 10 (ref 5–15)
BUN: 21 mg/dL — ABNORMAL HIGH (ref 6–20)
CHLORIDE: 111 mmol/L (ref 101–111)
CO2: 24 mmol/L (ref 22–32)
Calcium: 8.8 mg/dL — ABNORMAL LOW (ref 8.9–10.3)
Creatinine, Ser: 0.63 mg/dL (ref 0.44–1.00)
GFR calc non Af Amer: >60 mL/min (ref >60)
GLUCOSE: 125 mg/dL — AB (ref 65–99)
Potassium: 3.5 mmol/L (ref 3.5–5.1)
Sodium: 145 mmol/L (ref 135–145)

## 2015-08-03 LAB — GLUCOSE, CAPILLARY
GLUCOSE-CAPILLARY: 103 mg/dL — AB (ref 65–99)
Glucose-Capillary: 108 mg/dL — ABNORMAL HIGH (ref 65–99)
Glucose-Capillary: 131 mg/dL — ABNORMAL HIGH (ref 65–99)
Glucose-Capillary: 116 mg/dL — ABNORMAL HIGH (ref 65–99)
Glucose-Capillary: 125 mg/dL — ABNORMAL HIGH (ref 65–99)
Glucose-Capillary: 104 mg/dL — ABNORMAL HIGH (ref 65–99)

## 2015-08-03 MED ORDER — KCL IN DEXTROSE-NACL 20-5-0.9 MEQ/L-%-% IV SOLN
INTRAVENOUS | Status: DC
  Administered 2015-08-03: 18:00:00 via INTRAVENOUS
  Administered 2015-08-04: 50 mL/h via INTRAVENOUS
  Filled 2015-08-03 (×2): qty 1000

## 2015-08-03 MED ORDER — GLUCAGON HCL (RDNA) 1 MG IJ SOLR
INTRAMUSCULAR | Status: AC | PRN
  Administered 2015-08-03: 1 mg via INTRAVENOUS

## 2015-08-03 MED ORDER — MIDAZOLAM HCL 2 MG/2ML IJ SOLN
INTRAMUSCULAR | Status: AC
  Filled 2015-08-03: qty 2

## 2015-08-03 MED ORDER — GLUCAGON HCL RDNA (DIAGNOSTIC) 1 MG IJ SOLR
INTRAMUSCULAR | Status: AC
  Filled 2015-08-03: qty 1

## 2015-08-03 MED ORDER — LIDOCAINE HCL 1 % IJ SOLN
INTRAMUSCULAR | Status: AC
  Administered 2015-08-03: 10 mL
  Filled 2015-08-03: qty 20

## 2015-08-03 MED ORDER — IOPAMIDOL (ISOVUE-300) INJECTION 61%
INTRAVENOUS | Status: AC
  Administered 2015-08-03: 10 mL
  Filled 2015-08-03: qty 50

## 2015-08-03 MED ORDER — MIDAZOLAM HCL 2 MG/2ML IJ SOLN
INTRAMUSCULAR | Status: AC | PRN
  Administered 2015-08-03: 0.5 mg via INTRAVENOUS

## 2015-08-03 MED ORDER — BACLOFEN 1 MG/ML ORAL SUSPENSION
5.0000 mg | Freq: Two times a day (BID) | ORAL | Status: DC
  Administered 2015-08-03 – 2015-08-04 (×2): 5 mg
  Filled 2015-08-03 (×4): qty 0.5

## 2015-08-03 MED ORDER — CEFAZOLIN SODIUM-DEXTROSE 2-4 GM/100ML-% IV SOLN
INTRAVENOUS | Status: AC
  Administered 2015-08-03: 2000 mg
  Filled 2015-08-03: qty 100

## 2015-08-03 MED ORDER — FENTANYL CITRATE (PF) 100 MCG/2ML IJ SOLN
INTRAMUSCULAR | Status: AC
  Filled 2015-08-03: qty 2

## 2015-08-03 MED ORDER — FENTANYL CITRATE (PF) 100 MCG/2ML IJ SOLN
INTRAMUSCULAR | Status: AC | PRN
  Administered 2015-08-03: 50 ug via INTRAVENOUS

## 2015-08-03 NOTE — Progress Notes (Signed)
Pt back from IR 

## 2015-08-03 NOTE — Progress Notes (Signed)
PT to IR

## 2015-08-03 NOTE — Progress Notes (Signed)
rn called IR, pt schedule around 1300-1400.   Pt had jewelry on. rn took it off, put it in a denture cup, and gave it to husband for him to keep in his possession.  Metal colored wedding band Metal colored ring with clear stone Gold colored ring Metal colored watch

## 2015-08-03 NOTE — NC FL2 (Signed)
[place] LEVEL OF CARE SCREENING TOOL     IDENTIFICATION  Patient Name: Brooke Watson Birthdate: 12/07/1933 Sex: female Admission Date (Current Location): 07/25/2015  [facility] and [place] Number:  [employer] and Address:  [facility], [place] [address] [place], [place] [address]      Provider Number: [id number]  Attending Physician Name and Address:  Lavina Hamman, MD  Relative Name and Phone Number:       Current Level of Care: Hospital Recommended Level of Care: [place] Prior Approval Number:    Date Approved/Denied:   PASRR Number: [id number]  Discharge Plan: SNF    Current Diagnoses: Patient Active Problem List   Diagnosis Date Noted  . Pressure ulcer 08/03/2015  . Dysphagia   . Weakness generalized   . DNR (do not resuscitate)   . Encounter for palliative care   . Goals of care, counseling/discussion   . Palliative care encounter   . Parkinson disease ([place])   . SDH (subdural hematoma) ([place])   . Malnutrition of moderate degree 07/27/2015  . ICH (intracerebral hemorrhage) ([place])   . Nontraumatic cortical hemorrhage of right cerebral hemisphere ([place])   . Other specified hypothyroidism   . Dehydration 07/25/2015  . Acute cerebral hemorrhage ([place]) 07/25/2015  . Accelerated hypertension 07/25/2015  . Acute encephalopathy 06/13/2015  . Aggressive behavior 09/05/2014  . Dementia with behavioral disturbance 07/01/2014  . Delusions ([place]) 07/01/2014  . RLS (restless legs syndrome) 02/16/2014  . Cognitive decline 11/02/2013    Orientation RESPIRATION BLADDER Height & Weight     Self, Place  Normal Continent Weight: 143 lb 4.8 oz (65 kg) Height:  5\' 5"  (165.1 cm)  BEHAVIORAL SYMPTOMS/MOOD NEUROLOGICAL BOWEL NUTRITION STATUS      Continent  (PEG tube)  AMBULATORY STATUS COMMUNICATION OF NEEDS Skin   Limited Assist Verbally Normal                       Personal Care Assistance Level of  Assistance  Bathing, Feeding, Dressing Bathing Assistance: Maximum assistance   Dressing Assistance: Limited assistance     Functional Limitations Info             SPECIAL CARE FACTORS FREQUENCY  OT (By licensed OT), PT (By licensed PT)     PT Frequency: daily OT Frequency: daily            Contractures Contractures Info: Not present    Additional Factors Info  Allergies, Code Status Code Status Info: DNR Allergies Info: ambien, amoxicillin           Current Medications (08/03/2015):  This is the current hospital active medication list Current Facility-Administered Medications  Medication Dose Route Frequency Provider Last Rate Last Dose  . acetaminophen (TYLENOL) tablet 500 mg  500 mg Oral Q6H PRN Cherene Altes, MD   500 mg at 08/02/15 1524   Or  . acetaminophen (TYLENOL) suppository 650 mg  650 mg Rectal Q6H PRN Cherene Altes, MD      . amLODipine (NORVASC) tablet 5 mg  5 mg Oral Daily Cherene Altes, MD   5 mg at 08/02/15 [id number]  . antiseptic oral rinse (CPC / CETYLPYRIDINIUM CHLORIDE 0.05%) solution 7 mL  7 mL Mouth Rinse q12n4p Florencia Reasons, MD   7 mL at 08/03/15 1205  . artificial tears (LACRILUBE) ophthalmic ointment   Both Eyes Q3H PRN Cherene Altes, MD   1 application at  08/01/15 1515  . baclofen (LIORESAL) 10 mg/mL oral suspension 5 mg  5 mg Per Tube BID Lavina Hamman, MD   5 mg at 08/03/15 1000  . ceFAZolin (ANCEF) 2-4 GM/100ML-% IVPB           . chlorhexidine (PERIDEX) 0.12 % solution 15 mL  15 mL Mouth Rinse BID Florencia Reasons, MD   15 mL at 08/03/15 1005  . cloNIDine (CATAPRES - Dosed in mg/24 hr) patch 0.3 mg  0.3 mg Transdermal Weekly Reyne Dumas, MD   0.3 mg at 07/31/15 1336  . dextrose 5 % and 0.9 % NaCl with KCl 20 mEq/L infusion   Intravenous Continuous Cherene Altes, MD 30 mL/hr at 08/03/15 1204    . feeding supplement (JEVITY 1.2 CAL) liquid 1,000 mL  1,000 mL Per Tube Continuous Florencia Reasons, MD   Stopped at 08/03/15 0016  . feeding supplement  (PRO-STAT SUGAR FREE 64) liquid 30 mL  30 mL Per Tube Daily Florencia Reasons, MD   30 mL at 08/02/15 0920  . fentaNYL (SUBLIMAZE) 100 MCG/2ML injection           . glucagon (human recombinant) (GLUCAGEN) 1 MG injection           . iopamidol (ISOVUE-300) 61 % injection           . labetalol (NORMODYNE) tablet 100 mg  100 mg Oral TID Cherene Altes, MD   100 mg at 08/02/15 2214  . labetalol (NORMODYNE,TRANDATE) injection 20 mg  20 mg Intravenous Q4H PRN Lily Kocher, MD   20 mg at 08/01/15 0520  . levothyroxine (SYNTHROID, LEVOTHROID) tablet 50 mcg  50 mcg Oral QAC breakfast Cherene Altes, MD   50 mcg at 08/02/15 0920  . lidocaine (XYLOCAINE) 1 % (with pres) injection           . midazolam (VERSED) 2 MG/2ML injection              Discharge Medications: Please see discharge summary for a list of discharge medications.  Relevant Imaging Results:  Relevant Lab Results:   Additional Information SSN: [id number]  Dulcy Fanny, LCSW

## 2015-08-03 NOTE — Progress Notes (Signed)
Per Dr Barbie Banner,  4/26 today at 1900 pt can have only crushed meds in minimal clear liquids.  Tomorrow 4/27 at 0700 tube feedings can start.  Start at 67ml/hr, titrate up dose every 4 hours, to goal.   Do not use panda today 4/26

## 2015-08-03 NOTE — Progress Notes (Signed)
pts daughter has called twice, rn has given extensive updates both times.

## 2015-08-03 NOTE — Progress Notes (Signed)
Pt at times able to tell rn what her name is, gives incorrect age most of the time, choose church as location. Pt will occasionally talking and it make sense, rn took off pts jewelry and told pt that rn was giving jewelry to husband. Pt said "what husband, I don't have a husband".  Husband heard this and said "here I am". Husband came around to side of bed so pt could see him, pt said "thats not my husband".    At other times pt mumbles to herself and words are imcomprehensible.

## 2015-08-03 NOTE — Clinical Social Work Placement (Signed)
   CLINICAL SOCIAL WORK PLACEMENT  NOTE  Date:  08/03/2015  Patient Details  Name: Brooke Watson MRN: [id number] Date of Birth: Jan 03, 1934  Clinical Social Work is seeking post-discharge placement for this patient at the [place] level of care (*CSW will initial, date and re-position this form in  chart as items are completed):  Yes   Patient/family provided with [place] Work Department's list of facilities offering this level of care within the geographic area requested by the patient (or if unable, by the patient's family).  Yes   Patient/family informed of their freedom to choose among providers that offer the needed level of care, that participate in Medicare, Medicaid or managed care program needed by the patient, have an available bed and are willing to accept the patient.  Yes   Patient/family informed of [facility]'s ownership interest in [facility] and [facility], as well as of the fact that they are under no obligation to receive care at these facilities.  PASRR submitted to EDS on 08/03/15     PASRR number received on 08/03/15     Existing PASRR number confirmed on       FL2 transmitted to all facilities in geographic area requested by pt/family on 08/03/15     FL2 transmitted to all facilities within larger geographic area on       Patient informed that his/her managed care company has contracts with or will negotiate with certain facilities, including the following:            Patient/family informed of bed offers received.  Patient chooses bed at       Physician recommends and patient chooses bed at      Patient to be transferred to   on  .  Patient to be transferred to facility by       Patient family notified on   of transfer.  Name of family member notified:  daughter [name]     PHYSICIAN Please sign FL2     Additional Comment:    _______________________________________________ Dulcy Fanny,  LCSW 08/03/2015, 2:23 PM

## 2015-08-03 NOTE — Procedures (Signed)
20 Fr pull through gastrostomy tube No comp/EBL 

## 2015-08-03 NOTE — Progress Notes (Signed)
[facility] Progress Note  Patient: Brooke Watson [id number]   PCP: Gilford Rile, MD DOB: 1933/06/01   DOA: 07/25/2015   DOS: 08/03/2015   Date of Service: the patient was seen and examined on 08/03/2015 Outpatient [place] neurologist  Subjective: No acute event identified overnight. The patient continues to remain lethargic without any meaningful conversation or movement of extremity. Nutrition: Has been nothing by mouth overnight for procedure  Brief hospital course: Patient was admitted on 07/25/2015, with complaint of progressive lethargy and decreased oral intake after her recent fall, was found to have intracranial hemorrhage. Neurology as well as neurosurgery was consulted. Initial recommendation was adequate blood pressure control and the patient was not felt requiring any acute surgical intervention from neurosurgery. A repeat CT scan on 07/30/2015 showed slight improvement in the subarachnoid hemorrhage, no change in subdural hematoma and stable corpus callosal hemorrhage. On April 24 nasogastric tube was inserted and the patient has been started on nutrition via tube. As per discussion with the family with palliative care PEG tube placement has been planned on 08/03/2015. Currently further plan is to monitor the patient after insertion of the PEG tube and discharged to SNF on 08/04/2015.  Assessment and Plan: 1. ICH (intracerebral hemorrhage) (HCC) Progressive acute encephalopathy with baseline dementia. Possibly associated with fall although could be multifactorial. Neurosurgery recommends no surgical intervention. EEG does not show any acute evidence of seizure or epileptogenic foci and shows metabolic encephalopathy. Neurology initially recommended Sinemet for Parkinson but felt that the patient's tremors are likely secondary to side effect from antipsychotic medication and recommended to slowly taper it off. Patient has not been receiving any  antipsychotic medication here in the hospital and tremors are resolved. Patient is not showing any meaningful recovery regarding spontaneous movement and palliative care was consulted. Patient has been started on tube feeds and will undergo PEG tube placement on 08/03/2015. Current plan is to discharge to SNF with physical therapy. Baclofen added for spasticity. Discontinue aspirin.  2. Accelerated hypertension. Blood pressure significantly elevated initially. With addition of the clonidine patch and the blood pressure has improved significantly. Currently continue amlodipine 5 mg home dose, clonidine patch 0.3 mg weekly, labetalol 100 mg 3 times a day.  3. Behavioral disturbances with dementia. Patient has been on Seroquel at home 100 250 mg daily. Was also on Depakote 250 mg daily. Recently had some increase tremors which was thought to be secondary to haloperidol as well as Seroquel. Since the patient has been hospitalized this medication have been discontinued and currently patient is not on any medication for behavioral disturbances. No evidence of agitation identified here in the hospital. We'll likely discontinue this medications on discharge.  4. Hypothyroidism. Continue Synthroid.  5. Pressure ulcer on the leg. Wound care consulted.  6. Protein calorie malnutrition. Moderate. On-tube feeding at present.  7. Goals of care discussion. With progressive dementia as well as lethargy and recurrent fall palliative care was consulted. Agents daughter who is an NP at [place] is currently guiding the care along with the husband. As per the palliative care note," family is open to all offered and available medical intervention to prolong life". Patient maintains DO NOT RESUSCITATE CODE STATUS. Currently the plan is to continue current care and nutrition as well as therapy and monitor the patient for progress at the SNF. I feel that the patient will benefit from palliative care  consultation at the SNF for continued discussion regarding goals of care depending patient's progress.  Activity: physical therapy recommends  SNF Bowel regimen: last BM 07/31/2015, bowel regimen ordered Diet: Remains nothing by mouth, PEG tube ordered DVT Prophylaxis: Mechanical compression  Advance goals of care discussion: DNR/DNI  Family Communication: no family was present at bedside, at the time of interview.   Disposition:  Expected discharge date: 08/04/2015 Barriers to safe discharge: PEG tube placement and diet resumption  Consultants: Neurology, neurosurgery, palliative care Procedures: NG tube insertion, PEG tube placement  Antibiotics: Anti-infectives    Start     Dose/Rate Route Frequency Ordered Stop   08/03/15 1412  ceFAZolin (ANCEF) 2-4 GM/100ML-% IVPB    Comments:  Dhers, Patricia   : cabinet override      08/03/15 1412 08/04/15 0229       No intake or output data in the 24 hours ending 08/03/15 1418 Filed Weights   07/25/15 1844  Weight: 65 kg (143 lb 4.8 oz)    Objective: Physical Exam: Filed Vitals:   08/03/15 0155 08/03/15 0431 08/03/15 1030 08/03/15 1254  BP: 153/77 159/83 158/90 165/84  Pulse: 47 63 50 61  Temp: 98 F (36.7 C) 97.7 F (36.5 C) 99 F (37.2 C) 98 F (36.7 C)  TempSrc: Axillary Axillary Axillary Oral  Resp: 18 18 20 16   Height:      Weight:      SpO2: 96% 97% 99% 100%     General: Appear in mild distress, no Rash; Oral Mucosa moist. Cardiovascular: S1 and S2 Present, no Murmur, difficult to assess JVD Respiratory: Bilateral Air entry present and Clear to Auscultation, no Crackles, no wheezes Abdomen: Bowel Sound present, Soft and no tenderness Extremities: no Pedal edema, no calf tenderness Neurology: Grossly no focal neuro deficit.  Data Reviewed: CBC:  Recent Labs Lab 07/28/15 0510 07/31/15 0544 08/01/15 0352  WBC 8.0 8.0 9.9  HGB 13.0 13.2 13.6  HCT 39.5 40.4 41.6  MCV 89.4 90.4 91.4  PLT 175 223 [id number]    Basic Metabolic Panel:  Recent Labs Lab 07/28/15 0510 07/29/15 0646 07/29/15 1044 07/31/15 0544 08/01/15 0352 08/03/15 0641  NA 141 140  --  142 145 145  K 3.5 3.6  --  3.7 4.0 3.5  CL 108 107  --  109 112* 111  CO2 22 22  --  23 23 24   GLUCOSE 116* 113*  --  117* 123* 125*  BUN 10 14  --  23* 29* 21*  CREATININE 0.88 0.79  --  0.80 0.91 0.63  CALCIUM 8.5* 8.6*  --  8.8* 8.7* 8.8*  MG  --   --  2.1  --   --   --    GFR: Estimated Creatinine Clearance: 51.3 mL/min (by C-G formula based on Cr of 0.63). Liver Function Tests:  Recent Labs Lab 07/28/15 0510 07/31/15 0544 08/01/15 0352  AST 22 21 22   ALT 13* 15 13*  ALKPHOS 59 64 62  BILITOT 0.8 0.8 0.9  PROT 5.5* 5.8* 5.7*  ALBUMIN 2.5* 2.4* 2.6*   No results for input(s): LIPASE, AMYLASE in the last 168 hours.  Recent Labs Lab 07/28/15 0510 07/29/15 0606 07/30/15 1521 07/31/15 1226  AMMONIA 72* 31 39* 43*   Coagulation Profile: No results for input(s): INR, PROTIME in the last 168 hours. Cardiac Enzymes: No results for input(s): CKTOTAL, CKMB, CKMBINDEX, TROPONINI in the last 168 hours. BNP (last 3 results) No results for input(s): PROBNP in the last 8760 hours. HbA1C: No results for input(s): HGBA1C in the last 72 hours. CBG:  Recent Labs Lab 08/02/15 2243  08/03/15 0014 08/03/15 0428 08/03/15 0821 08/03/15 1101  GLUCAP 125* 131* 108* 103* 116*   Lipid Profile: No results for input(s): CHOL, HDL, LDLCALC, TRIG, CHOLHDL, LDLDIRECT in the last 72 hours. Thyroid Function Tests: No results for input(s): TSH, T4TOTAL, FREET4, T3FREE, THYROIDAB in the last 72 hours. Anemia Panel: No results for input(s): VITAMINB12, FOLATE, FERRITIN, TIBC, IRON, RETICCTPCT in the last 72 hours. Urine analysis:    Component Value Date/Time   COLORURINE AMBER* 07/25/2015 1502   APPEARANCEUR CLEAR 07/25/2015 1502   APPEARANCEUR Clear 09/20/2014 1304   LABSPEC 1.022 07/25/2015 1502   PHURINE 6.0 07/25/2015 1502    GLUCOSEU NEGATIVE 07/25/2015 1502   HGBUR TRACE* 07/25/2015 1502   BILIRUBINUR NEGATIVE 07/25/2015 1502   BILIRUBINUR Negative 09/20/2014 1304   KETONESUR NEGATIVE 07/25/2015 1502   PROTEINUR NEGATIVE 07/25/2015 1502   PROTEINUR Trace 09/20/2014 1304   UROBILINOGEN 1.0 07/09/2014 2200   NITRITE NEGATIVE 07/25/2015 1502   NITRITE Negative 09/20/2014 1304   LEUKOCYTESUR NEGATIVE 07/25/2015 1502   LEUKOCYTESUR Negative 09/20/2014 1304   Sepsis Labs: @LABRCNTIP (procalcitonin:4,lacticidven:4)  ) Recent Results (from the past 240 hour(s))  Urine culture     Status: None   Collection Time: 07/25/15  3:02 PM  Result Value Ref Range Status   Specimen Description URINE, CLEAN CATCH  Final   Special Requests NONE  Final   Culture   Final    NO GROWTH 1 DAY Performed at [facility]    Report Status 07/26/2015 FINAL  Final  MRSA PCR Screening     Status: None   Collection Time: 07/25/15  6:57 PM  Result Value Ref Range Status   MRSA by PCR NEGATIVE NEGATIVE Final    Comment:        The GeneXpert MRSA Assay (FDA approved for NASAL specimens only), is one component of a comprehensive MRSA colonization surveillance program. It is not intended to diagnose MRSA infection nor to guide or monitor treatment for MRSA infections.       Studies: No results found.   Scheduled Meds: . amLODipine  5 mg Oral Daily  . antiseptic oral rinse  7 mL Mouth Rinse q12n4p  . baclofen  5 mg Per Tube BID  . ceFAZolin      . chlorhexidine  15 mL Mouth Rinse BID  . cloNIDine  0.3 mg Transdermal Weekly  . feeding supplement (PRO-STAT SUGAR FREE 64)  30 mL Per Tube Daily  . fentaNYL      . glucagon (human recombinant)      . iopamidol      . labetalol  100 mg Oral TID  . levothyroxine  50 mcg Oral QAC breakfast  . lidocaine      . midazolam       Continuous Infusions: . dextrose 5 % and 0.9 % NaCl with KCl 20 mEq/L 30 mL/hr at 08/03/15 1204  . feeding supplement (JEVITY 1.2 CAL)  Stopped (08/03/15 0016)   PRN Meds: acetaminophen **OR** acetaminophen, artificial tears, labetalol  Time spent: 30 minutes  Author: Berle Mull, MD [facility] Pager: [phone] 08/03/2015 2:18 PM  If 7PM-7AM, please contact night-coverage at [email or web address], password [facility]

## 2015-08-03 NOTE — Progress Notes (Signed)
rn has spoke to daughter on the phone at least 4 times today, case manager has spoken to daughter on phone today 2x, today.   Daughter highly involved in pts care and questioning most if not all treatment plans/actions. Such as why the patient had to be NPO at midnight, and when can feeding tubes start, and the pt BP was low at one point after PEG tube placed and the pt needed a bolus emergently.

## 2015-08-03 NOTE — Progress Notes (Signed)
Occupational Therapy Treatment Patient Details Name: Brooke Watson MRN: [id number] DOB: Jan 06, 1934 Today's Date: 08/03/2015    History of present illness 82 y.o. female patient with h/o dementia who presents with multiple intracranial bleeds s/p fall. Repeat CT shows new small left sided hygroma with right sided 2-3 mm shift   OT comments  Pt remains total assist +2 for bed mobility and grooming activities sitting EOB. Pt with urinary incontinent episode at start of session; pt total assist +2 for cleaning and UB dressing at bed level. Pt tolerated neck PROM to stretch L lateral side and bil UE PROM. D/c plan remains appropriate. Will continue to follow acutely.    Follow Up Recommendations  SNF    Equipment Recommendations  3 in 1 bedside comode;Wheelchair (measurements OT);Hospital bed    Recommendations for Other Services      Precautions / Restrictions Precautions Precautions: Fall Precaution Comments: Panda, urinary incontinence Restrictions Weight Bearing Restrictions: No       Mobility Bed Mobility Overal bed mobility: Needs Assistance Bed Mobility: Rolling;Supine to Sit;Sit to Supine Rolling: Total assist;+2 for physical assistance   Supine to sit: Total assist;+2 for physical assistance Sit to supine: Total assist;+2 for physical assistance   General bed mobility comments: Total assist +2 for all bed mobility at this time. Pt not following commands to assist with bed mobility.   Transfers                 General transfer comment: Not assessed at this time-transport present to take pt off the floor.    Balance Overall balance assessment: Needs assistance Sitting-balance support: Feet supported Sitting balance-Leahy Scale: Poor Sitting balance - Comments: L lean with head turned toward L side.                           ADL Overall ADL's : Needs assistance/impaired     Grooming: Total assistance;Sitting;Wash/dry face       Lower  Body Bathing: Total assistance;Bed level;+2 for physical assistance Lower Body Bathing Details (indicate cue type and reason): for peri care Upper Body Dressing : Total assistance;Bed level Upper Body Dressing Details (indicate cue type and reason): to doff/don hospital gown                   General ADL Comments: Pt with urinary incontinence; total assist +2 to clean pt at bed level. Pt unable to participate in in ADL activity; no following commands with verbal, tacile cues and hand over hand provided.      Vision                     Perception     Praxis      Cognition   Behavior During Therapy: Flat affect Overall Cognitive Status: Impaired/Different from baseline Area of Impairment: Attention;Following commands   Current Attention Level: Focused    Following Commands: Follows one step commands inconsistently;Follows one step commands with increased time            Extremity/Trunk Assessment               Exercises Other Exercises Other Exercises: neck PROM to midline with stretch to L lateral neck. Pt indicating pain with turning toward R side. Other Exercises: PROM bil elbow, wrist and hand x 5 each. Increased tone noted; pt unable to assist with ROM exercises.   Shoulder Instructions       General Comments  Pertinent Vitals/ Pain       Pain Assessment: Faces Faces Pain Scale: Hurts little more Pain Location: neck with ROM Pain Descriptors / Indicators: Grimacing Pain Intervention(s): Monitored during session;Repositioned  Home Living                                          Prior Functioning/Environment              Frequency Min 2X/week     Progress Toward Goals  OT Goals(current goals can now be found in the care plan section)  Progress towards OT goals: Not progressing toward goals - comment (remains total assist for ADLs and bed mobility )  Acute Rehab OT Goals Patient Stated Goal: none stated   Plan Discharge plan remains appropriate    Co-evaluation                 End of Session     Activity Tolerance Patient tolerated treatment well   Patient Left in bed;with call bell/phone within reach;with family/visitor present;Other (comment) (transport present)   Nurse Communication Mobility status;Other (comment) (pt with urinary incontinence-cleaned pt)        Time: 1343-1400 OT Time Calculation (min): 17 min  Charges: OT General Charges $OT Visit: 1 Procedure OT Treatments $Self Care/Home Management : 8-22 mins  Binnie Kand M.S., OTR/L Pager: [phone]  08/03/2015, 2:16 PM

## 2015-08-03 NOTE — Care Management Important Message (Signed)
Important Message  Patient Details  Name: Brooke Watson MRN: [id number] Date of Birth: May 13, 1933   Medicare Important Message Given:  Yes    Pollie Friar, RN 08/03/2015, 1:58 PM

## 2015-08-04 DIAGNOSIS — I619 Nontraumatic intracerebral hemorrhage, unspecified: Secondary | ICD-10-CM | POA: Insufficient documentation

## 2015-08-04 LAB — GLUCOSE, CAPILLARY
GLUCOSE-CAPILLARY: 106 mg/dL — AB (ref 65–99)
GLUCOSE-CAPILLARY: 114 mg/dL — AB (ref 65–99)
GLUCOSE-CAPILLARY: 131 mg/dL — AB (ref 65–99)
Glucose-Capillary: 132 mg/dL — ABNORMAL HIGH (ref 65–99)
Glucose-Capillary: 121 mg/dL — ABNORMAL HIGH (ref 65–99)
Glucose-Capillary: 109 mg/dL — ABNORMAL HIGH (ref 65–99)

## 2015-08-04 LAB — CBC
HEMATOCRIT: 37.1 % (ref 36.0–46.0)
HEMOGLOBIN: 12.2 g/dL (ref 12.0–15.0)
MCH: 29.8 pg (ref 26.0–34.0)
MCHC: 32.9 g/dL (ref 30.0–36.0)
MCV: 90.5 fL (ref 78.0–100.0)
Platelets: 253 10*3/uL (ref 150–400)
RBC: 4.1 MIL/uL (ref 3.87–5.11)
RDW: 14.1 % (ref 11.5–15.5)
WBC: 8.8 10*3/uL (ref 4.0–10.5)

## 2015-08-04 LAB — BASIC METABOLIC PANEL
Anion gap: 7 (ref 5–15)
BUN: 21 mg/dL — ABNORMAL HIGH (ref 6–20)
CHLORIDE: 112 mmol/L — AB (ref 101–111)
CO2: 27 mmol/L (ref 22–32)
CREATININE: 0.71 mg/dL (ref 0.44–1.00)
Calcium: 8.8 mg/dL — ABNORMAL LOW (ref 8.9–10.3)
GFR calc non Af Amer: >60 mL/min (ref >60)
Glucose, Bld: 129 mg/dL — ABNORMAL HIGH (ref 65–99)
POTASSIUM: 3.9 mmol/L (ref 3.5–5.1)
Sodium: 146 mmol/L — ABNORMAL HIGH (ref 135–145)

## 2015-08-04 LAB — MAGNESIUM: Magnesium: 1.8 mg/dL (ref 1.7–2.4)

## 2015-08-04 MED ORDER — BACLOFEN 1 MG/ML ORAL SUSPENSION: 5.0000 mg | Freq: Every day | ORAL | Status: DC

## 2015-08-04 NOTE — Progress Notes (Signed)
SLP Cancellation Note  Patient Details Name: Brooke Watson MRN: [id number] DOB: 17-Oct-1933   Cancelled treatment:       Reason Eval/Treat Not Completed: Other (comment) (pt now has PEG for nutrition, recommend trial follow up SLP at SNF for trial dysphagia treatment, also recommend adequate oral care to decrease aspiration risk - SLP to sign off at this time)   Luanna Salk, [facility] [place] [phone]

## 2015-08-04 NOTE — Progress Notes (Signed)
Nutrition Follow-up  DOCUMENTATION CODES:   Non-severe (moderate) malnutrition in context of chronic illness  INTERVENTION:  Continue to increase Jevity 1.2 by 10 ml/hr every 4 hours via PEG to goal rate of 50 ml/hr.   30 ml Prostat once daily.   Tube feeding regimen provides 1540 kcal (100% of needs), 82 grams of protein, and 968 ml of H2O.   Recommend providing 90 ml free water flushes every 4 hours to provide an additional 540 ml of water daily.    NUTRITION DIAGNOSIS:   Inadequate oral intake related to lethargy/confusion as evidenced by per patient/family report.  ongoing  GOAL:   Patient will meet greater than or equal to 90% of their needs  Unmet  MONITOR:   PO intake, Supplement acceptance, Labs, Skin, I & O's, Weight trends  REASON FOR ASSESSMENT:   Consult Enteral/tube feeding initiation and management  ASSESSMENT:   82 yo female admitted on 4/17 s/p fall with ICH.   Pt had PEG placed yesterday. TF was held at midnight 4/26 and restarted at 0700 this AM at 10 ml/hr. Jevity 1.2 currently infusing at 20 ml/hr via PEG. Pt resting comfortably at time of visit. Her weight has trended up 3 lbs in the past 10 days. Blister on L heel is now stage II pressure ulcer. Plan for D/C to SNF when TF is at goal.   Labs: elevated sodium  Diet Order:  Diet NPO time specified  Skin:  Wound (see comment) (Stage II pressure ulcer on left heel)  Last BM:  4/24  Height:   Ht Readings from Last 1 Encounters:  07/25/15 5\' 5"  (1.651 m)    Weight:   Wt Readings from Last 1 Encounters:  08/04/15 146 lb 13.2 oz (66.6 kg)    Ideal Body Weight:  56.8 kg  BMI:  Body mass index is 24.43 kg/(m^2).  Estimated Nutritional Needs:   Kcal:  1400-1600  Protein:  80-90 gm  Fluid:  >/= 1.5 L  EDUCATION NEEDS:   No education needs identified at this time  [place], LDN Inpatient Clinical Dietitian Pager: [phone] After Hours Pager: [phone]

## 2015-08-04 NOTE — Progress Notes (Signed)
Referring Physician(s): Dr Florencia Reasons  Supervising Physician: Aletta Edouard  Patient Status: In-pt  Chief Complaint:  CVA; FTT; PCM  Subjective:  Percutaneous gastric tube placed 4/26 Intact afeb Site clean and dry  Allergies: Ambien and Amoxicillin  Medications: Prior to Admission medications   Medication Sig Start Date End Date Taking? Authorizing Provider  amLODipine (NORVASC) 5 MG tablet Take 5 mg by mouth daily.   Yes Historical Provider, MD  aspirin EC 81 MG tablet Take 1 tablet (81 mg total) by mouth daily. 06/30/14  Yes Melvenia Beam, MD  atorvastatin (LIPITOR) 10 MG tablet Take 10 mg by mouth at bedtime. 06/30/14  Yes Historical Provider, MD  divalproex (DEPAKOTE ER) 250 MG 24 hr tablet Take 1 tablet (250 mg total) by mouth daily with breakfast. 06/15/15  Yes Modena Jansky, MD  haloperidol (HALDOL) 0.5 MG tablet Take 1 tablet (0.5 mg total) by mouth 2 (two) times daily as needed (Only for severe agitation). Do not exceed 10 tabs monthly Patient taking differently: Take 0.25-0.5 mg by mouth 2 (two) times daily as needed (Only for severe agitation). Do not exceed 10 tabs monthly 05/17/15  Yes Melvenia Beam, MD  ibuprofen (ADVIL,MOTRIN) 200 MG tablet Take 400 mg by mouth 2 (two) times daily as needed for moderate pain.   Yes Historical Provider, MD  levothyroxine (SYNTHROID, LEVOTHROID) 50 MCG tablet Take 50 mcg by mouth daily before breakfast.   Yes Historical Provider, MD  Memantine HCl-Donepezil HCl (NAMZARIC) 28-10 MG CP24 Take 1 capsule by mouth daily.   Yes Historical Provider, MD  QUEtiapine (SEROQUEL) 100 MG tablet Take 100 mg by mouth at bedtime. 07/05/15  Yes Historical Provider, MD     Vital Signs: BP 140/93 mmHg  Pulse 44  Temp(Src) 99.1 F (37.3 C) (Axillary)  Resp 17  Ht 5\' 5"  (1.651 m)  Wt 146 lb 13.2 oz (66.6 kg)  BMI 24.43 kg/m2  SpO2 97%  Physical Exam  Abdominal: Soft. Bowel sounds are normal. There is no tenderness.  Skin: Skin is warm  and dry.  Site clean and dry NT No bleeding +BS    Nursing note and vitals reviewed.   Imaging: Ir Gastrostomy Tube Mod Sed  08/03/2015  INDICATION: Stroke EXAM: PERC PLACEMENT GASTROSTOMY MEDICATIONS: ; Antibiotics were administered within 1 hour of the procedure. Glucagon 1 mg IV ANESTHESIA/SEDATION: Versed 0.5 mg IV; Fentanyl 50 mcg IV Moderate Sedation Time:  15 The patient was continuously monitored during the procedure by the interventional radiology nurse under my direct supervision. CONTRAST:  69mL ISOVUE-300 IOPAMIDOL (ISOVUE-300) INJECTION 61% - administered into the gastric lumen. FLUOROSCOPY TIME:  Fluoroscopy Time: 3 minutes 6 seconds (4 mGy). COMPLICATIONS: None immediate. PROCEDURE: The procedure, risks, benefits, and alternatives were explained to the patient. Questions regarding the procedure were encouraged and answered. The patient understands and consents to the procedure. The epigastrium was prepped with Betadine in a sterile fashion, and a sterile drape was applied covering the operative field. A sterile gown and sterile gloves were used for the procedure. A 5-French orogastric tube is placed under fluoroscopic guidance. Scout imaging of the abdomen confirms barium within the transverse colon. The stomach was distended with gas. Under fluoroscopic guidance, an 18 gauge needle was utilized to puncture the anterior wall of the body of the stomach. An Amplatz wire was advanced through the needle passing a T fastener into the lumen of the stomach. The T fastener was secured for gastropexy. A 9-French sheath was inserted.  A snare was advanced through the 9-French sheath. A Britta Mccreedy was advanced through the orogastric tube. It was snared then pulled out the oral cavity, pulling the snare, as well. The leading edge of the gastrostomy was attached to the snare. It was then pulled down the esophagus and out the percutaneous site. It was secured in place. Contrast was injected. The image  demonstrates placement of a 20-French pull-through type gastrostomy tube into the body of the stomach. IMPRESSION: Successful 20 French pull-through gastrostomy. Electronically Signed   By: Marybelle Killings M.D.   On: 08/03/2015 17:17   Dg Abd Portable 1v  08/01/2015  CLINICAL DATA:  Feeding tube placement EXAM: PORTABLE ABDOMEN - 1 VIEW COMPARISON:  None. FINDINGS: Feeding tube is in place with the tip in the descending duodenum. Nonobstructive bowel gas pattern. Lung bases are clear. IMPRESSION: Feeding tube tip in the second portion of the duodenum. Electronically Signed   By: Rolm Baptise M.D.   On: 08/01/2015 13:55    Labs:  CBC:  Recent Labs  07/28/15 0510 07/31/15 0544 08/01/15 0352 08/04/15 0630  WBC 8.0 8.0 9.9 8.8  HGB 13.0 13.2 13.6 12.2  HCT 39.5 40.4 41.6 37.1  PLT 175 223 238 253    COAGS:  Recent Labs  07/25/15 1932  INR 1.12    BMP:  Recent Labs  07/31/15 0544 08/01/15 0352 08/03/15 0641 08/04/15 0630  NA 142 145 145 146*  K 3.7 4.0 3.5 3.9  CL 109 112* 111 112*  CO2 23 23 24 27   GLUCOSE 117* 123* 125* 129*  BUN 23* 29* 21* 21*  CALCIUM 8.8* 8.7* 8.8* 8.8*  CREATININE 0.80 0.91 0.63 0.71  GFRNONAA >60 58* >60 >60  GFRAA >60 >60 >60 >60    LIVER FUNCTION TESTS:  Recent Labs  07/26/15 0420 07/28/15 0510 07/31/15 0544 08/01/15 0352  BILITOT 1.4* 0.8 0.8 0.9  AST 36 22 21 22   ALT 16 13* 15 13*  ALKPHOS 72 59 64 62  PROT 6.0* 5.5* 5.8* 5.7*  ALBUMIN 3.1* 2.5* 2.4* 2.6*    Assessment and Plan:  Perc G tube in place May use now  Electronically Signed: Zoriyah Scheidegger A 08/04/2015, 9:54 AM   I spent a total of 15 Minutes at the the patient's bedside AND on the patient's hospital floor or unit, greater than 50% of which was counseling/coordinating care for G tube placement

## 2015-08-04 NOTE — Progress Notes (Signed)
Physical Therapy Treatment Patient Details Name: Brooke Watson MRN: [id number] DOB: 1933/04/18 Today's Date: 08/04/2015    History of Present Illness 82 y.o. female patient with h/o dementia who presents with multiple intracranial bleeds s/p fall. Repeat CT shows new small left sided hygroma with right sided 2-3 mm shift    PT Comments    Patient progressing some with ability to stand with support from [place] lift.  Also noted lifting her R leg when moving lift out from under her feet.  Educated nursing on use of lift for transfers as some improvements possibly from bearing weight in LE's.  Follow Up Recommendations  SNF;Supervision/Assistance - 24 hour     Equipment Recommendations  Wheelchair (measurements PT);Wheelchair cushion (measurements PT);Hospital bed    Recommendations for Other Services       Precautions / Restrictions Precautions Precautions: Fall Precaution Comments: PEG, urinary incontinence Required Braces or Orthoses: Other Brace/Splint Other Brace/Splint: abdominal binder    Mobility  Bed Mobility Overal bed mobility: Needs Assistance Bed Mobility: Rolling;Sidelying to Sit Rolling: Mod assist;+2 for physical assistance Sidelying to sit: +2 for physical assistance;Max assist       General bed mobility comments: assisted to roll, bring legs off bed and to lift trunk upright  Transfers Overall transfer level: Needs assistance   Transfers: Sit to/from Stand;Stand Pivot Transfers Sit to Stand: Total assist Stand pivot transfers: Total assist       General transfer comment: use of lift to stand with much improved upright posture and postive support through LE's; remained standing safely with support of Clarise Cruz plus for turning and positioning over chair after standing at bedside about 45 seconds  Ambulation/Gait                 Stairs            Wheelchair Mobility    Modified Rankin (Stroke Patients Only)       Balance Overall  balance assessment: Needs assistance   Sitting balance-Leahy Scale: Poor Sitting balance - Comments: fallsto L in sitting when unsupported; trunk remains rigid despite trunk rotation to manage rigidity     Standing balance-Leahy Scale: Zero Standing balance comment: total assist of Sara lift for standing                    Cognition Arousal/Alertness: Awake/alert Behavior During Therapy: Flat affect Overall Cognitive Status: Impaired/Different from baseline Area of Impairment: Attention;Following commands   Current Attention Level: Focused Memory: Decreased short-term memory Following Commands: Follows one step commands inconsistently;Follows one step commands with increased time            Exercises General Exercises - Lower Extremity Heel Slides: PROM;Both;5 reps;Supine Other Exercises Other Exercises: cervical PROM and soft tissue work to lateral cervical musculature Other Exercises: PROM for trunk rotation in supine for managing increased tone    General Comments General comments (skin integrity, edema, etc.): Continued to educate spouse during session on pt's deficits and guarded progress       Pertinent Vitals/Pain Faces Pain Scale: Hurts little more Pain Location: neck wtih ROM Pain Descriptors / Indicators: Grimacing;Discomfort Pain Intervention(s): Monitored during session;Repositioned;Other (comment) (stretching)    Home Living                      Prior Function            PT Goals (current goals can now be found in the care plan section)  Frequency  Min 3X/week    PT Plan Current plan remains appropriate    Co-evaluation             End of Session Equipment Utilized During Treatment: Gait belt Activity Tolerance: Patient tolerated treatment well Patient left: in chair;with call bell/phone within reach;with chair alarm set;with family/visitor present     Time: [id number] PT Time Calculation (min) (ACUTE ONLY): 28  min  Charges:  $Therapeutic Exercise: 8-22 mins $Therapeutic Activity: 8-22 mins                    G Codes:      Reginia Naas 08-21-15, 1:31 PM  Magda Kiel, [place] 2015-08-21

## 2015-08-04 NOTE — Progress Notes (Signed)
[facility] Progress Note  Patient: Brooke Watson [id number]   PCP: Gilford Rile, MD DOB: 03-16-1934   DOA: 07/25/2015   DOS: 08/04/2015   Date of Service: the patient was seen and examined on 08/04/2015 Outpatient [place] neurologist  Subjective: Patient is able to carry on a conversation. Showing some improvement. Has on and off lethargy events.. Nutrition: Has been nothing by mouth overnight for procedure  Brief hospital course: Patient was admitted on 07/25/2015, with complaint of progressive lethargy and decreased oral intake after her recent fall, was found to have intracranial hemorrhage. Neurology as well as neurosurgery was consulted. Initial recommendation was adequate blood pressure control and the patient was not felt requiring any acute surgical intervention from neurosurgery. A repeat CT scan on 07/30/2015 showed slight improvement in the subarachnoid hemorrhage, no change in subdural hematoma and stable corpus callosal hemorrhage. On April 24 nasogastric tube was inserted and the patient has been started on nutrition via tube. As per discussion with the family with palliative care PEG tube placement has been placed on 08/03/2015. Currently further plan is to monitor the patient after insertion of the PEG tube and discharged to SNF on 08/05/2015.  Assessment and Plan: 1. ICH (intracerebral hemorrhage) (HCC) Progressive acute encephalopathy with baseline dementia. Possibly associated with fall although could be multifactorial. Neurosurgery recommends no surgical intervention. EEG does not show any acute evidence of seizure or epileptogenic foci and shows metabolic encephalopathy. Neurology initially recommended Sinemet for Parkinson but felt that the patient's tremors are likely secondary to side effect from antipsychotic medication and recommended to slowly taper it off. Patient has not been receiving any antipsychotic medication here in the hospital and  tremors are resolved. Patient is not showing any meaningful recovery regarding spontaneous movement and palliative care was consulted. Patient has been started on tube feeds and will undergo PEG tube placement on 08/03/2015. Current plan is to discharge to SNF with physical therapy. Baclofen added for spasticity. Discontinue aspirin.  2. Accelerated hypertension. Blood pressure significantly elevated initially. With addition of the clonidine patch and the blood pressure has improved significantly. Currently continue amlodipine 5 mg home dose, clonidine patch 0.3 mg weekly,  With bradycardia and improved blood pressure I will discontinue labetalol 100 mg 3 times a day.  3. Behavioral disturbances with dementia. Patient has been on Seroquel at home 100 250 mg daily. Was also on Depakote 250 mg daily. Recently had some increase tremors which was thought to be secondary to haloperidol as well as Seroquel. Since the patient has been hospitalized this medication have been discontinued and currently patient is not on any medication for behavioral disturbances. No evidence of agitation identified here in the hospital. We'll likely discontinue this medications on discharge.  4. Hypothyroidism. Continue Synthroid.  5. Pressure ulcer on the leg. Wound care consulted.  6. Protein calorie malnutrition. Moderate. On-tube feeding at present.  7. Goals of care discussion. With progressive dementia as well as lethargy and recurrent fall palliative care was consulted. Agents daughter who is an NP at [place] is currently guiding the care along with the husband. As per the palliative care note," family is open to all offered and available medical intervention to prolong life". Patient maintains DO NOT RESUSCITATE CODE STATUS. Currently the plan is to continue current care and nutrition as well as therapy and monitor the patient for progress at the SNF. I feel that the patient will benefit from  palliative care consultation at the SNF for continued discussion regarding goals of care  depending patient's progress.  Activity: physical therapy recommends SNF Bowel regimen: last BM 08/01/2015, bowel regimen ordered Diet: Remains nothing by mouth, PEG tube ordered DVT Prophylaxis: Mechanical compression  Advance goals of care discussion: DNR/DNI  Family Communication: Discussed with the patient's daughter on phone. Opportunity was given to ask question and all questions were answered satisfactorily. .   Disposition:  Expected discharge date: 08/05/2015 Barriers to safe discharge: PEG tube placement and diet resumption  Consultants: Neurology, neurosurgery, palliative care Procedures: NG tube insertion, PEG tube placement  Antibiotics: Anti-infectives    Start     Dose/Rate Route Frequency Ordered Stop   08/03/15 1412  ceFAZolin (ANCEF) 2-4 GM/100ML-% IVPB    Comments:  Dhers, Patricia   : cabinet override      08/03/15 1412 08/03/15 1433        Intake/Output Summary (Last 24 hours) at 08/04/15 1727 Last data filed at 08/04/15 1550  Gross per 24 hour  Intake 2904.5 ml  Output      0 ml  Net 2904.5 ml   Filed Weights   07/25/15 1844 08/04/15 0500  Weight: 65 kg (143 lb 4.8 oz) 66.6 kg (146 lb 13.2 oz)   Objective: Physical Exam: Filed Vitals:   08/04/15 0510 08/04/15 0935 08/04/15 1002 08/04/15 1356  BP: 154/80 140/93 140/93 128/86  Pulse: 44  57 58  Temp: 99.1 F (37.3 C)  98.9 F (37.2 C) 98.6 F (37 C)  TempSrc: Axillary  Axillary Axillary  Resp: 17  20 20   Height:      Weight:      SpO2: 97%  98% 98%    General: Appear in mild distress, no Rash; Oral Mucosa moist. Cardiovascular: S1 and S2 Present, no Murmur, difficult to assess JVD Respiratory: Bilateral Air entry present and Clear to Auscultation, no Crackles, no wheezes Abdomen: Bowel Sound present, Soft and no tenderness Extremities: no Pedal edema, no calf tenderness Neurology: Able to carry out a  conversation, scanning speech, unable to show significant muscle strength, withdraws to painful stimuli  Data Reviewed: CBC:  Recent Labs Lab 07/31/15 0544 08/01/15 0352 08/04/15 0630  WBC 8.0 9.9 8.8  HGB 13.2 13.6 12.2  HCT 40.4 41.6 37.1  MCV 90.4 91.4 90.5  PLT 223 238 [id number]   Basic Metabolic Panel:  Recent Labs Lab 07/29/15 0646 07/29/15 1044 07/31/15 0544 08/01/15 0352 08/03/15 0641 08/04/15 0630  NA 140  --  142 145 145 146*  K 3.6  --  3.7 4.0 3.5 3.9  CL 107  --  109 112* 111 112*  CO2 22  --  23 23 24 27   GLUCOSE 113*  --  117* 123* 125* 129*  BUN 14  --  23* 29* 21* 21*  CREATININE 0.79  --  0.80 0.91 0.63 0.71  CALCIUM 8.6*  --  8.8* 8.7* 8.8* 8.8*  MG  --  2.1  --   --   --  1.8   GFR: Estimated Creatinine Clearance: 51.3 mL/min (by C-G formula based on Cr of 0.71). Liver Function Tests:  Recent Labs Lab 07/31/15 0544 08/01/15 0352  AST 21 22  ALT 15 13*  ALKPHOS 64 62  BILITOT 0.8 0.9  PROT 5.8* 5.7*  ALBUMIN 2.4* 2.6*   No results for input(s): LIPASE, AMYLASE in the last 168 hours.  Recent Labs Lab 07/29/15 0606 07/30/15 1521 07/31/15 1226  AMMONIA 31 39* 43*   CBG:  Recent Labs Lab 08/04/15 0027 08/04/15 0433 08/04/15 0803 08/04/15 1115  08/04/15 1648  GLUCAP 106* 114* 132* 131* 121*   Urine analysis:    Component Value Date/Time   COLORURINE AMBER* 07/25/2015 1502   APPEARANCEUR CLEAR 07/25/2015 1502   APPEARANCEUR Clear 09/20/2014 1304   LABSPEC 1.022 07/25/2015 1502   PHURINE 6.0 07/25/2015 1502   GLUCOSEU NEGATIVE 07/25/2015 1502   HGBUR TRACE* 07/25/2015 1502   BILIRUBINUR NEGATIVE 07/25/2015 1502   BILIRUBINUR Negative 09/20/2014 1304   KETONESUR NEGATIVE 07/25/2015 1502   PROTEINUR NEGATIVE 07/25/2015 1502   PROTEINUR Trace 09/20/2014 1304   UROBILINOGEN 1.0 07/09/2014 2200   NITRITE NEGATIVE 07/25/2015 1502   NITRITE Negative 09/20/2014 1304   LEUKOCYTESUR NEGATIVE 07/25/2015 1502   LEUKOCYTESUR Negative  09/20/2014 1304    Recent Results (from the past 240 hour(s))  MRSA PCR Screening     Status: None   Collection Time: 07/25/15  6:57 PM  Result Value Ref Range Status   MRSA by PCR NEGATIVE NEGATIVE Final    Comment:        The GeneXpert MRSA Assay (FDA approved for NASAL specimens only), is one component of a comprehensive MRSA colonization surveillance program. It is not intended to diagnose MRSA infection nor to guide or monitor treatment for MRSA infections.    Studies: No results found.   Scheduled Meds: . amLODipine  5 mg Oral Daily  . antiseptic oral rinse  7 mL Mouth Rinse q12n4p  . baclofen  5 mg Per Tube BID  . chlorhexidine  15 mL Mouth Rinse BID  . cloNIDine  0.3 mg Transdermal Weekly  . feeding supplement (PRO-STAT SUGAR FREE 64)  30 mL Per Tube Daily  . levothyroxine  50 mcg Oral QAC breakfast   Continuous Infusions: . feeding supplement (JEVITY 1.2 CAL) 1,000 mL (08/04/15 1157)   PRN Meds: acetaminophen **OR** acetaminophen, artificial tears, labetalol  Time spent: 30 minutes  Author: Berle Mull, MD [facility] Pager: [phone] 08/04/2015 5:27 PM  If 7PM-7AM, please contact night-coverage at [email or web address], password [facility]

## 2015-08-04 NOTE — Clinical Social Work Note (Signed)
CSW spoke with patient's daughter re: bed offers.  The top 3 choices the daughter reports: 1) [facility] 2) [facility] 3) [facility]- none have responded with a bed offer.  CSW reached out to the admission's liaison's at all three facilities to expedite bed decision.  CSW relayed this information to daughter.    Nonnie Done, LCSW [phone]  2H 1-14; [place] Licensed Clinical Social Worker

## 2015-08-04 NOTE — Progress Notes (Signed)
Daily Progress Note   Patient Name: Brooke Watson       Date: 08/04/2015 DOB: [birth year]  Age: 82 y.o. MRN#: [id number] Attending Physician: Lavina Hamman, MD Primary Care Physician: Gilford Rile, MD Admit Date: 07/25/2015  Reason for Consultation/Follow-up: Establishing goals of care and Psychosocial/spiritual support  Subjective:   -continued conversation with daughter [name] by telephone regarding current medical situation and disposition options and anticipatroy care needs  -discussed importance of continued conversation regarding future decsions as it relates to life prolonging measures  vs comfort and dignity  -Questions and concerns addressed.   Family encouraged to call with questions or concerns.  PMT will continue to support holistically.  -emotional support offered     Length of Stay: 10 days  Current Medications: Scheduled Meds:  . amLODipine  5 mg Oral Daily  . antiseptic oral rinse  7 mL Mouth Rinse q12n4p  . baclofen  5 mg Per Tube BID  . chlorhexidine  15 mL Mouth Rinse BID  . cloNIDine  0.3 mg Transdermal Weekly  . feeding supplement (PRO-STAT SUGAR FREE 64)  30 mL Per Tube Daily  . levothyroxine  50 mcg Oral QAC breakfast    Continuous Infusions: . feeding supplement (JEVITY 1.2 CAL) 1,000 mL (08/04/15 0701)    PRN Meds: acetaminophen **OR** acetaminophen, artificial tears, labetalol  Physical Exam: Physical Exam  Constitutional: She appears lethargic. She appears cachectic. She appears ill.  - opens eyes to name, unable to follow commands, non verbal  Cardiovascular: Normal rate, regular rhythm and normal heart sounds.   Pulmonary/Chest: She has decreased breath sounds in the right lower field and the left lower field.  Neurological: She appears  lethargic.  Skin: Skin is warm and dry.   Abdomen: noted PEG, unremarkable, WNL             Vital Signs: BP 140/93 mmHg  Pulse 57  Temp(Src) 98.9 F (37.2 C) (Axillary)  Resp 20  Ht 5\' 5"  (1.651 m)  Wt 66.6 kg (146 lb 13.2 oz)  BMI 24.43 kg/m2  SpO2 98% SpO2: SpO2: 98 % O2 Device: O2 Device: Not Delivered O2 Flow Rate: O2 Flow Rate (L/min): 2 L/min  Intake/output summary: No intake or output data in the 24 hours ending 08/04/15 1152 LBM: Last BM Date: 08/01/15 Baseline Weight: Weight: 65  kg (143 lb 4.8 oz) Most recent weight: Weight: 66.6 kg (146 lb 13.2 oz)       Palliative Assessment/Data: Flowsheet Rows        Most Recent Value   Intake Tab    Referral Department  Hospitalist   Unit at Time of Referral  Med/Surg Unit   Palliative Care Primary Diagnosis  Neurology   Palliative Care Type  New Palliative care   Reason for referral  Clarify Goals of Care   Date first seen by Palliative Care  07/31/15   Clinical Assessment    Palliative Performance Scale Score  30%   Pain Max last 24 hours  4   Pain Min Last 24 hours  3   Dyspnea Max Last 24 Hours  4   Dyspnea Min Last 24 hours  3   Psychosocial & Spiritual Assessment    Palliative Care Outcomes    Patient/Family meeting held?  Yes   Who was at the meeting?  husband at the bedside, daughter over the phone.    Palliative Care Outcomes  Clarified goals of care   Palliative Care follow-up planned  Yes, Facility      Additional Data Reviewed: CBC    Component Value Date/Time   WBC 8.8 08/04/2015 0630   WBC 7.1 07/14/2015 1133   RBC 4.10 08/04/2015 0630   RBC 4.82 07/14/2015 1133   HGB 12.2 08/04/2015 0630   HCT 37.1 08/04/2015 0630   HCT 43.3 07/14/2015 1133   PLT 253 08/04/2015 0630   PLT 189 07/14/2015 1133   MCV 90.5 08/04/2015 0630   MCV 90 07/14/2015 1133   MCH 29.8 08/04/2015 0630   MCH 29.7 07/14/2015 1133   MCHC 32.9 08/04/2015 0630   MCHC 33.0 07/14/2015 1133   RDW 14.1 08/04/2015 0630   RDW 14.8  07/14/2015 1133   LYMPHSABS 2.1 07/25/2015 1254   MONOABS 1.3* 07/25/2015 1254   EOSABS 0.0 07/25/2015 1254   BASOSABS 0.0 07/25/2015 1254    CMP     Component Value Date/Time   NA 146* 08/04/2015 0630   NA 142 07/14/2015 1133   K 3.9 08/04/2015 0630   CL 112* 08/04/2015 0630   CO2 27 08/04/2015 0630   GLUCOSE 129* 08/04/2015 0630   GLUCOSE 83 07/14/2015 1133   BUN 21* 08/04/2015 0630   BUN 15 07/14/2015 1133   CREATININE 0.71 08/04/2015 0630   CALCIUM 8.8* 08/04/2015 0630   PROT 5.7* 08/01/2015 0352   PROT 6.2 07/14/2015 1133   ALBUMIN 2.6* 08/01/2015 0352   ALBUMIN 4.0 07/14/2015 1133   AST 22 08/01/2015 0352   ALT 13* 08/01/2015 0352   ALKPHOS 62 08/01/2015 0352   BILITOT 0.9 08/01/2015 0352   BILITOT 0.5 07/14/2015 1133   GFRNONAA >60 08/04/2015 0630   GFRAA >60 08/04/2015 0630       Problem List:  Patient Active Problem List   Diagnosis Date Noted  . Pressure ulcer 08/03/2015  . Dysphagia   . Weakness generalized   . DNR (do not resuscitate)   . Encounter for palliative care   . Goals of care, counseling/discussion   . Palliative care encounter   . Parkinson disease ([place])   . SDH (subdural hematoma) ([place])   . Malnutrition of moderate degree 07/27/2015  . ICH (intracerebral hemorrhage) ([place])   . Nontraumatic cortical hemorrhage of right cerebral hemisphere ([place])   . Other specified hypothyroidism   . Dehydration 07/25/2015  . Acute cerebral hemorrhage ([place]) 07/25/2015  . Accelerated  hypertension 07/25/2015  . Acute encephalopathy 06/13/2015  . Aggressive behavior 09/05/2014  . Dementia with behavioral disturbance 07/01/2014  . Delusions ([place]) 07/01/2014  . RLS (restless legs syndrome) 02/16/2014  . Cognitive decline 11/02/2013     Palliative Care Assessment & Plan    Code Status:  DNR    Code Status Orders        Start     Ordered   08/01/15 1149  Do not attempt resuscitation (DNR)   Continuous    Question Answer Comment  In the  event of cardiac or respiratory ARREST Do not call a "code blue"   In the event of cardiac or respiratory ARREST Do not perform Intubation, CPR, defibrillation or ACLS   In the event of cardiac or respiratory ARREST Use medication by any route, position, wound care, and other measures to relive pain and suffering. May use oxygen, suction and manual treatment of airway obstruction as needed for comfort.      08/01/15 1148    Code Status History    Date Active Date Inactive Code Status Order ID Comments User Context   07/25/2015  6:59 PM 08/01/2015 11:48 AM Full Code [id number]  Lily Kocher, MD Inpatient   06/14/2015 12:04 AM 06/15/2015  7:57 PM Full Code [id number]  Etta Quill, DO ED        Goals of Care/Additional Recommendations:   Family is open to all offered and available medical interventions to prolong life    Palliative Prophylaxis:   Aspiration, Bowel Regimen, Delirium Protocol, Frequent Pain Assessment, Oral Care and Turn Reposition   Prognosis:  Pending desire for life prolonging intervetnions   Discharge Planning:   SNF for rehab, working with SW    Thank you for allowing the Palliative Medicine Team to assist in the care of this patient.   Time In: 1120 Time Out: 1140 Total Time 20 min Prolonged Time Billed  no         Knox Royalty, NP  08/04/2015, 11:52 AM  Please contact Palliative Medicine Team phone at [phone] for questions and concerns.

## 2015-08-04 NOTE — Clinical Social Work Note (Addendum)
CSW received a call from [facility] wishes to have more information re: wound.  CSW contacted RN to gain clarification and confirm flowsheet correct.  Patient has stage II PU on her heel which requires a foam dressing which is changed every 3-5 days per RN.  Also confirmed, patient has PEG.  This information was passed to [facility].  Admission's coordinator sent information to DON for consideration for STR at time of discharge.   Second choice is [facility]- who cannot admit patient until Monday due to lack of bed availability  Third choice is [facility]- patient's clinicals currently being reviewed by [place], LCSW [phone]  2H 1-14; [place] Licensed Clinical Social Worker

## 2015-08-05 DIAGNOSIS — E440 Moderate protein-calorie malnutrition: Secondary | ICD-10-CM

## 2015-08-05 DIAGNOSIS — Z515 Encounter for palliative care: Secondary | ICD-10-CM

## 2015-08-05 LAB — GLUCOSE, CAPILLARY
GLUCOSE-CAPILLARY: 125 mg/dL — AB (ref 65–99)
GLUCOSE-CAPILLARY: 128 mg/dL — AB (ref 65–99)
GLUCOSE-CAPILLARY: 130 mg/dL — AB (ref 65–99)
Glucose-Capillary: 122 mg/dL — ABNORMAL HIGH (ref 65–99)

## 2015-08-05 LAB — BASIC METABOLIC PANEL
ANION GAP: 11 (ref 5–15)
BUN: 24 mg/dL — ABNORMAL HIGH (ref 6–20)
CO2: 24 mmol/L (ref 22–32)
Calcium: 8.7 mg/dL — ABNORMAL LOW (ref 8.9–10.3)
Chloride: 108 mmol/L (ref 101–111)
Creatinine, Ser: 0.72 mg/dL (ref 0.44–1.00)
Glucose, Bld: 128 mg/dL — ABNORMAL HIGH (ref 65–99)
POTASSIUM: 3.5 mmol/L (ref 3.5–5.1)
SODIUM: 143 mmol/L (ref 135–145)

## 2015-08-05 MED ORDER — JEVITY 1.2 CAL PO LIQD: 1000.0000 mL | ORAL | Status: DC

## 2015-08-05 MED ORDER — CLONIDINE HCL 0.3 MG/24HR TD PTWK: 0.3000 mg | MEDICATED_PATCH | TRANSDERMAL | Status: DC

## 2015-08-05 MED ORDER — BACLOFEN 10 MG PO TABS: 5.0000 mg | ORAL_TABLET | Freq: Every day | ORAL | Status: DC

## 2015-08-05 MED ORDER — PRO-STAT SUGAR FREE PO LIQD: 30.0000 mL | Freq: Every day | ORAL | Status: DC

## 2015-08-05 NOTE — Discharge Summary (Signed)
[facility] Discharge Summary   Patient: Brooke Watson [id number]   PCP: Gilford Rile, MD DOB: [birth year]   Date of admission: 07/25/2015   Date of discharge:  08/05/2015    Discharge Diagnoses:  Principal Problem:   ICH (intracerebral hemorrhage) ([place]) Active Problems:   Dementia with behavioral disturbance   Acute encephalopathy   Dehydration   Acute cerebral hemorrhage (HCC)   Accelerated hypertension   Nontraumatic cortical hemorrhage of right cerebral hemisphere [facility])   Other specified hypothyroidism   Malnutrition of moderate degree   SDH (subdural hematoma) ([place])   Palliative care encounter   Encounter for palliative care   Goals of care, counseling/discussion   DNR (do not resuscitate)   Dysphagia   Weakness generalized   Pressure ulcer   Nontraumatic intracerebral hemorrhage ([place])  Recommendations for Outpatient Follow-up:  1. Please follow up with neurology and PCP   Follow-up Information    Follow up with [facility].   Specialty:  [place]   Why:  active with [facility], PT, OT, aide and social work   Sport and exercise psychologist information:   [place] [address] [phone]       Follow up with Gilford Rile, MD. Schedule an appointment as soon as possible for a visit in 1 week.   Specialty:  Internal Medicine   Contact information:   [place] [address] [phone]       Follow up with Melvenia Beam, MD. Call in 1 week.   Specialty:  Neurology   Why:  for follow up in 2 weeks.    Contact information:   [address] [place] [address] [phone]      Diet recommendation: NPO, speech therapy follow up Jevity 1.2 via PEG to goal rate of 50 ml/hr.  30 ml Prostat once daily.  Tube feeding regimen provides 1540 kcal (100% of needs), 82 grams of protein, and 968 ml of H2O.  Recommend providing 90 ml free water flushes every 4 hours to provide an additional 540 ml of  water daily.  Activity: The patient is advised to gradually reintroduce usual activities.  Discharge Condition: stable  History of present illness: As per the H and P dictated on admission, "Brooke Watson is a 82 y.o. woman with a history of dementia, TIA, and HTN who was brought to the ED by her husband for evaluation of progressive lethargy, decreased oral intake, gait disturbance since last Thursday, and recent fall (he denied any head trauma). The patient was admitted in March for dehydration. She had an extensive work-up then that was fairly unremarkable. Per review of the medical record, there have been ongoing discussions about whether dose adjustments needed to be made on some of her neuropsych medications. According to her daughter, they stopped her Haldol on Thursday when her physical therapist first noted marked changes in her gait. No LOC. No documented fever. No N/V/D. She has had decreased urine output. No chest pain or shortness of breath. No abdominal pain. No cought. When she is well, she eats 2 meals daily. The family has been trying to encourage her to increase her fluid intake daily. Currently, she drinks about 1L daily.  Unfortunately, CT scan in the ED shows acute cerebral hemorrhage. The neurohospitalist was called from the ED. We are admitting to the stepdown unit at [facility]; consultation to neurosurgery to be facilitated by neurology, once she has been assessed."  Hospital Course:  Patient was admitted on 07/25/2015, with complaint of progressive lethargy and decreased oral intake after her recent fall, was found to have intracranial hemorrhage. Neurology as well as neurosurgery was consulted. Initial recommendation was adequate blood pressure control and the patient was not felt requiring any acute surgical intervention from neurosurgery. A repeat CT scan on 07/30/2015 showed slight improvement in the subarachnoid hemorrhage, no change in subdural hematoma  and stable corpus callosal hemorrhage. On April 24 nasogastric tube was inserted and the patient has been started on nutrition via tube. As per discussion with the family with palliative care PEG tube placement has been placed on 08/03/2015.  Summary of her active problems in the hospital is as following.  1. ICH (intracerebral hemorrhage) ([place])  Progressive acute encephalopathy with baseline dementia.  Possibly associated with fall although could be multifactorial.  Neurosurgery recommends no surgical intervention.  EEG does not show any acute evidence of seizure or epileptogenic foci and shows metabolic encephalopathy.  Neurology initially recommended Sinemet for Parkinson but felt that the patient's tremors are likely secondary to side effect from antipsychotic medication and recommended to slowly taper it off.  Patient has not been receiving any antipsychotic medication here in the hospital and tremors are resolved.  Patient is not showing any meaningful recovery regarding spontaneous movement and palliative care was consulted. Family preferred to monitor the patient for progression with therapy. Patient has been started on tube feeds and underwent PEG tube placement on 08/03/2015.  Current plan is to discharge to SNF with physical therapy.  Baclofen added for spasticity.  Discontinue aspirin.   2. Accelerated hypertension.  Blood pressure significantly elevated initially.  With addition of the clonidine patch and the blood pressure has improved significantly.  Currently continue amlodipine 5 mg home dose, clonidine patch 0.3 mg weekly. With bradycardia and improved blood pressure I will discontinue labetalol 100 mg 3 times a day.   3. Behavioral disturbances with dementia.  Patient has been on Seroquel at home 100-150 mg daily.  Was also on Depakote 250 mg daily.  Recently as an outpatient, had some increase tremors which was thought to be secondary to haloperidol as well as Seroquel.    Since the patient has been hospitalized this medication have been discontinued and currently patient is not on any medication for behavioral disturbances.  No evidence of agitation identified here in the hospital.  Given acute encephalopathy and no agitation, We'll discontinue this medications on discharge.   4. Hypothyroidism.  Continue Synthroid.   5. Pressure ulcer on the leg.  foam dressing which is changed every 3-5 days.  6. Protein calorie malnutrition.  Moderate.  On-tube feeding at present.  Jevity 1.2 via PEG to goal rate of 50 ml/hr.  30 ml Prostat once daily.  Tube feeding regimen provides 1540 kcal (100% of needs), 82 grams of protein, and 968 ml of H2O.  Recommend providing 90 ml free water flushes every 4 hours to provide an additional 540 ml of water daily.   7. Dysphagia From encephalopathy and dementia with new ICH Speech therapy unable to clear the patient for oral nutrition. Family wanted PEG tube placement, it was informed to the family that it will not prevent any aspiration. Pt will need continued speech therapy evaluation with the SNF.  8. Goals of care discussion.  With progressive dementia as well as lethargy and recurrent fall palliative care was consulted.  Agents daughter who is an NP at [employer] is currently guiding the care along with the husband.  As per the palliative care note," family is open to all offered and available medical intervention to prolong life".  Family maintains DO NOT RESUSCITATE CODE STATUS.  Currently the plan is to continue current care and nutrition as well as therapy and monitor the patient for progress at the SNF.  I feel that the patient will benefit from palliative care consultation at the SNF for continued discussion regarding goals of care depending patient's progress.    All other chronic medical condition were stable during the hospitalization.  Patient was seen by physical therapy, who recommended SNF, which was  arranged by Education officer, museum and case Freight forwarder. On the day of the discharge the patient's vitals were stable, and no other acute medical condition were reported by patient. the patient was felt safe to be discharge at SNF with therapy.  Procedures and Results:  PEG tube placement  EEG, 07/29/2015 Impression:  Abnormal routine inpatient EEG suggestive of at least moderate encephalopathy as described. Clinical correlation is recommended .   EEG -07/30/2015 Impression:  Abnormal awake and drowsy routine inpatient EEG suggestive of mild to moderate encephalopathy as described. Clinical correlation is recommended .   Consultations:  NEUROSURGERY  NEUROLOGY  INTERVENTIONAL RADIOLOGY  PALLIATIVE CARE  DISCHARGE MEDICATION: Current Discharge Medication List    START taking these medications   Details  Amino Acids-Protein Hydrolys (FEEDING SUPPLEMENT, PRO-STAT SUGAR FREE 64,) LIQD Place 30 mLs into feeding tube daily. Qty: 900 mL, Refills: 0    baclofen (LIORESAL) 10 MG tablet Place 0.5 tablets (5 mg total) into feeding tube at bedtime. Qty: 20 each, Refills: 0    cloNIDine (CATAPRES - DOSED IN MG/24 HR) 0.3 mg/24hr patch Place 1 patch (0.3 mg total) onto the skin once a week. Qty: 4 patch, Refills: 12    Nutritional Supplements (FEEDING SUPPLEMENT, JEVITY 1.2 CAL,) LIQD Place 1,000 mLs into feeding tube continuous. Qty: 1000 mL, Refills: 0      CONTINUE these medications which have NOT CHANGED   Details  amLODipine (NORVASC) 5 MG tablet Take 5 mg by mouth daily.    atorvastatin (LIPITOR) 10 MG tablet Take 10 mg by mouth at bedtime. Refills: 6    levothyroxine (SYNTHROID, LEVOTHROID) 50 MCG tablet Take 50 mcg by mouth daily before breakfast.      STOP taking these medications     aspirin EC 81 MG tablet      divalproex (DEPAKOTE ER) 250 MG 24 hr tablet      haloperidol (HALDOL) 0.5 MG tablet      ibuprofen (ADVIL,MOTRIN) 200 MG tablet      Memantine HCl-Donepezil  HCl (NAMZARIC) 28-10 MG CP24      QUEtiapine (SEROQUEL) 100 MG tablet        Allergies  Allergen Reactions  . Ambien [Zolpidem Tartrate]     Disorientation   . Amoxicillin     Unknown.  Has patient had a PCN reaction causing immediate rash, facial/tongue/throat swelling, SOB or lightheadedness with hypotension: unknown Has patient had a PCN reaction causing severe rash involving mucus membranes or skin necrosis: unknown Has patient had a PCN reaction that required hospitalization No Has patient had a PCN reaction occurring within the last 10 years: unknown If all of the above answers are "NO", then may proceed with Cephalosporin use.    Discharge Instructions    Amb Referral to Palliative Care    Complete by:  As directed      Discharge instructions    Complete by:  As directed  It is important that you read following instructions as well as go over your medication list with RN to help you understand your care after this hospitalization.  Discharge Instructions: Please follow-up with PCP in one week  Please request your primary care physician to go over all Hospital Tests and Procedure/Radiological results at the follow up,  Please get all Hospital records sent to your PCP by signing hospital release before you go home.   Do not take more than prescribed Pain, Sleep and Anxiety Medications. You were cared for by a hospitalist during your hospital stay. If you have any questions about your discharge medications or the care you received while you were in the hospital after you are discharged, you can call the unit and ask to speak with the hospitalist on call if the hospitalist that took care of you is not available.  Once you are discharged, your primary care physician will handle any further medical issues. Please note that NO REFILLS for any discharge medications will be authorized once you are discharged, as it is imperative that you return to your primary care physician (or  establish a relationship with a primary care physician if you do not have one) for your aftercare needs so that they can reassess your need for medications and monitor your lab values. You Must read complete instructions/literature along with all the possible adverse reactions/side effects for all the Medicines you take and that have been prescribed to you. Take any new Medicines after you have completely understood and accept all the possible adverse reactions/side effects. Wear Seat belts while driving.     Increase activity slowly    Complete by:  As directed           Discharge Exam: Filed Weights   07/25/15 1844 08/04/15 0500 08/05/15 0435  Weight: 65 kg (143 lb 4.8 oz) 66.6 kg (146 lb 13.2 oz) 66.9 kg (147 lb 7.8 oz)   Filed Vitals:   08/05/15 0526 08/05/15 0905  BP: 120/69 157/86  Pulse: 49 56  Temp: 97.5 F (36.4 C) 99.2 F (37.3 C)  Resp: 18 20   General: Appear in mild distress, no Rash; Oral Mucosa moist. Cardiovascular: S1 and S2 Present, no Murmur, difficult to assess JVD Respiratory: Bilateral Air entry present and Clear to Auscultation, no Crackles, no wheezes Abdomen: Bowel Sound present, Soft and no tenderness Extremities: no Pedal edema, no calf tenderness Neurology: INCOHERENT speech,  withdraws to painful stimuli, not following commands. Motor strength flickering bilaterally.  The results of significant diagnostics from this hospitalization (including imaging, microbiology, ancillary and laboratory) are listed below for reference.    Significant Diagnostic Studies: Ct Angio Head W/cm &/or Wo Cm  07/25/2015  CLINICAL DATA:  82 year old hypertensive female with altered mental status after fall 1 day ago. Subsequent encounter. EXAM: CT ANGIOGRAPHY HEAD AND NECK TECHNIQUE: Multidetector CT imaging of the head and neck was performed using the standard protocol during bolus administration of intravenous contrast. Multiplanar CT image reconstructions and MIPs were  obtained to evaluate the vascular anatomy. Carotid stenosis measurements (when applicable) are obtained utilizing NASCET criteria, using the distal internal carotid diameter as the denominator. CONTRAST:  100 cc Isovue 370. COMPARISON:  07/25/2015 head CT.  06/14/2015 brain MR. FINDINGS: CT HEAD Brain: 1.2 x 2 x 1.7 cm hematoma superior aspect of the corpus callosum causing mild mass effect upon the body corpus callosum. Small amounts subarachnoid blood anterior falx level. Subarachnoid hemorrhage posterior right frontal -parietal lobe (central sulcus) measuring up  to 1.4 cm. Tiny amount of deep dependent blood within the lateral ventricles suspected. Tentorial hematoma greater on the right measuring 1.4 x 1 x 1.3 cm. Atrophy. Ventricular prominence may be related to atrophy rather hydrocephalus and without change. No intracranial mass separate from above described findings nor area of abnormal enhancement. Calvarium and skull base: No skull fracture detected. Paranasal sinuses: Clear. Orbits: No acute abnormality.  Post lens replacement CTA NECK Aortic arch: Arch not completely assessed on the current exam. Right carotid system: No significant stenosis of the right carotid artery. Ectatic cervical segment of the right internal carotid artery with prominent folds. Mild ectasia of the distal cervical segment of the right internal carotid artery proximal to the skullbase. Left carotid system: Plaque without significant narrowing of the distal left common carotid artery/bifurcation. Markedly ectatic left internal carotid artery cervical segment. Fibromuscular dysplasia may be present. In addition to irritation areas of narrowing, fusiform dilation of the left internal carotid artery proximal to the skullbase measuring up to 11.8 mm. Vertebral arteries:Both vertebral arteries are patent without evidence of dissection. Vertebral arteries are ectatic at the C2 level bilaterally. Skeleton: No cervical spine fracture  detected. Cervical spondylotic changes with kyphosis centered at C5 level with various degrees of spinal stenosis and foraminal narrowing most prominent C5 level. No abnormal prevertebral soft tissue swelling. Other neck: Left lobe of thyroid nodules measuring up to 1.4 cm 2.2 cm can be assessed with elective thyroid ultrasound. CTA HEAD Anterior circulation: Anterior circulation without medium or large size vessel significant stenosis or occlusion. Mild narrowing cavernous segment internal carotid artery bilaterally. The A2 segment of the anterior cerebral arteries are displaced slightly superiorly by the hematoma located superior to the corpus callosum. No posttraumatic aneurysm or occlusion of the A2 segment of the anterior cerebral artery is noted. No saccular aneurysm detected. Posterior circulation: Mild irregularity narrowing of the distal vertebral arteries and basilar artery without high-grade stenosis. Venous sinuses: Patent. Anatomic variants: Negative. Delayed phase: As above. IMPRESSION: CT HEAD 1.2 x 2 x 1.7 cm hematoma superior aspect of the corpus callosum causing mild mass effect upon the body corpus callosum. Small amount of subarachnoid blood anterior falx level. Subarachnoid hemorrhage posterior right frontal -parietal lobe (central sulcus) measuring up to 1.4 cm. Tiny amount of deep dependent blood within the lateral ventricles suspected. Tentorial hematoma greater on the right measuring 1.4 x 1 x 1.3 cm. Atrophy. Ventricular prominence may be related to atrophy rather hydrocephalus and without change. No intracranial mass separate from above described findings nor area of abnormal enhancement. CTA NECK No significant stenosis of the right carotid artery. Ectatic cervical segment of the right internal carotid artery with prominent folds. Mild ectasia of the distal cervical segment of the right internal carotid artery proximal to the skullbase. Plaque without significant narrowing of the distal  left common carotid artery/bifurcation. Markedly ectatic left internal carotid artery cervical segment. Fibromuscular dysplasia may be present. In addition to regions of narrowing, there is fusiform dilation of the left internal carotid artery proximal to the skullbase measuring up to 11.8 mm. Both vertebral arteries are patent without evidence of dissection. Vertebral arteries are ectatic at the C2 level bilaterally. No cervical spine fracture detected. Cervical spondylotic changes with kyphosis centered at C5 level with various degrees of spinal stenosis and foraminal narrowing most prominent C5 level. No abnormal prevertebral soft tissue swelling. Left lobe of thyroid nodules measuring up to 1.4 cm 2.2 cm can be assessed with elective thyroid ultrasound. CTA HEAD Anterior  circulation without medium or large size vessel significant stenosis or occlusion. Mild narrowing cavernous segment internal carotid artery bilaterally. A2 segment of the anterior cerebral arteries are displaced slightly superiorly by the hematoma located superior to the corpus callosum. No posttraumatic aneurysm or occlusion of the A2 segment of the anterior cerebral artery. No saccular aneurysm detected. Mild irregularity narrowing of the distal vertebral arteries and basilar artery without high-grade stenosis. Electronically Signed   By: Genia Del M.D.   On: 07/25/2015 17:30   Ct Head Wo Contrast  07/30/2015  CLINICAL DATA:  AMS F/U ICH PMH: HTN, High cholesterol; Cognitive decline; Dementia; Thyroid disease; UTI; Arthritis EXAM: CT HEAD WITHOUT CONTRAST TECHNIQUE: Contiguous axial images were obtained from the base of the skull through the vertex without intravenous contrast. COMPARISON:  CT 07/26/2015 FINDINGS: Anterior corpus callosum hemorrhage measures 21 mmx 14 mm compared to 19 x 18 mm for no significant change in volume. Small amount of subarachnoid blood along the sylvian fissure and over the high RIGHT frontal parietal lobe .  Small amounts of subdural hematoma over the RIGHT tentorium and along the posterior LEFT interhemispheric fissure is unchanged (image 16, series 2). Stable ventricular dilatation.  No intraventricular hemorrhage. There is interval resolution of the low-density subdural hygroma previously seen on the LEFT. N o new intracranial hemorrhage. IMPRESSION: 1. Essentially stable anterior corpus callosum hemorrhage. 2. Slight improvement in subarachnoid hemorrhage along the RIGHT temporal lobe. 3. No significant change in the subdural hematoma over the right tentorium and posterior interhemispheric fissure. 4. Stable ventricular dilatation. Electronically Signed   By: Suzy Bouchard M.D.   On: 07/30/2015 18:49   Ct Head Wo Contrast  07/26/2015  ADDENDUM REPORT: 07/26/2015 17:13 ADDENDUM: Study discussed by telephone with Dr. Silverio Decamp on 07/26/2015 at 1710 hours. Electronically Signed   By: Genevie Ann M.D.   On: 07/26/2015 17:13  07/26/2015  CLINICAL DATA:  82 year old female with acute intracranial hemorrhage, presented with altered mental status after a fall. Hemorrhage in the anterior corpus callosum, subarachnoid and subdural hemorrhage. Initial encounter. EXAM: CT HEAD WITHOUT CONTRAST TECHNIQUE: Contiguous axial images were obtained from the base of the skull through the vertex without intravenous contrast. COMPARISON:  Head CT 07/25/2015.  CTA head and neck 07/25/2015. FINDINGS: No skull fracture identified. Visualized paranasal sinuses and mastoids are clear. There is a left posterior convexity scalp hematoma measuring up to 5 mm in thickness. Negative visualized orbits soft tissues. Anterior body corpus callosum hyperdense hemorrhage has not significantly changed (series 2, image 17). There is a small volume of subarachnoid hemorrhage in the right sylvian fissure which has slightly increased since yesterday. A small volume of subdural hematoma along the right tentorium is stable. No intraventricular hemorrhage  identified. There is a new left side subdural hygroma measuring 5 mm in thickness. Associated 2-3 mm of rightward midline shift is new. Trace subarachnoid hemorrhage at the superior right central sulcus is stable. There may be trace para fall seen subdural or subarachnoid hemorrhage. Generalized cerebral volume loss. Ex vacuo appearing mild ventriculomegaly. No cortically based acute infarct identified. Stable gray-white matter differentiation throughout the brain. IMPRESSION: 1. New small left side subdural hygroma since yesterday. Associated new rightward midline shift of 2-3 mm. 2. Stable corpus callosum hemorrhage. Small volume of right hemisphere subarachnoid hemorrhage has not significantly changed. 3. Small volume right tentorial subdural hematoma is stable. 4. Left posterior scalp hematoma without underlying fracture. Electronically Signed: By: Genevie Ann M.D. On: 07/26/2015 17:02   Ct Head Wo  Contrast  07/25/2015  CLINICAL DATA:  Altered mental status after fall 1 day prior EXAM: CT HEAD WITHOUT CONTRAST TECHNIQUE: Contiguous axial images were obtained from the base of the skull through the vertex without intravenous contrast. COMPARISON:  Head CT June 13, 2015 and brain MRI June 14, 2015 FINDINGS: There is focal hemorrhage arising in the rostrum of the corpus callosum measuring 1.9 x 1.9 cm. Hemorrhage extends superiorly to the right parafalcine region, likely with a degree of parafalcine subdural hematoma. The maximum thickness in this area measures 1.0 cm. There is also tentorial subdural hematoma anteriorly on the right with a maximum thickness of 6 mm. Intraparenchymal hemorrhage is noted at the high frontal-parietal junction measuring 1.3 x 1.2 cm. There is no midline shift. There is moderate generalized atrophy. No mass lesion is seen. There is no midline shift. No epidural fluid collections are identified. There is patchy small vessel disease in the centra semiovale bilaterally. No acute infarct  evident. Bony calvarium appears intact. The mastoid air cells are clear. No intraorbital lesions are evident. IMPRESSION: Right parafalcine in anterior right tentorial subdural hematomas. Parenchymal hemorrhage is seen in the corpus callosum anteriorly as well as at the right frontal -parietal junction near the vertex. No midline shift. Underlying atrophy with small vessel disease in the supratentorial white matter, stable. Critical Value/emergent results were called by telephone at the time of interpretation on 07/25/2015 at 3:50 pm to Dr. Leonard Schwartz , who verbally acknowledged these results. Electronically Signed   By: Lowella Grip III M.D.   On: 07/25/2015 15:50   Ct Angio Neck W/cm &/or Wo/cm  07/25/2015  CLINICAL DATA:  82 year old hypertensive female with altered mental status after fall 1 day ago. Subsequent encounter. EXAM: CT ANGIOGRAPHY HEAD AND NECK TECHNIQUE: Multidetector CT imaging of the head and neck was performed using the standard protocol during bolus administration of intravenous contrast. Multiplanar CT image reconstructions and MIPs were obtained to evaluate the vascular anatomy. Carotid stenosis measurements (when applicable) are obtained utilizing NASCET criteria, using the distal internal carotid diameter as the denominator. CONTRAST:  100 cc Isovue 370. COMPARISON:  07/25/2015 head CT.  06/14/2015 brain MR. FINDINGS: CT HEAD Brain: 1.2 x 2 x 1.7 cm hematoma superior aspect of the corpus callosum causing mild mass effect upon the body corpus callosum. Small amounts subarachnoid blood anterior falx level. Subarachnoid hemorrhage posterior right frontal -parietal lobe (central sulcus) measuring up to 1.4 cm. Tiny amount of deep dependent blood within the lateral ventricles suspected. Tentorial hematoma greater on the right measuring 1.4 x 1 x 1.3 cm. Atrophy. Ventricular prominence may be related to atrophy rather hydrocephalus and without change. No intracranial mass separate from  above described findings nor area of abnormal enhancement. Calvarium and skull base: No skull fracture detected. Paranasal sinuses: Clear. Orbits: No acute abnormality.  Post lens replacement CTA NECK Aortic arch: Arch not completely assessed on the current exam. Right carotid system: No significant stenosis of the right carotid artery. Ectatic cervical segment of the right internal carotid artery with prominent folds. Mild ectasia of the distal cervical segment of the right internal carotid artery proximal to the skullbase. Left carotid system: Plaque without significant narrowing of the distal left common carotid artery/bifurcation. Markedly ectatic left internal carotid artery cervical segment. Fibromuscular dysplasia may be present. In addition to irritation areas of narrowing, fusiform dilation of the left internal carotid artery proximal to the skullbase measuring up to 11.8 mm. Vertebral arteries:Both vertebral arteries are patent without evidence of  dissection. Vertebral arteries are ectatic at the C2 level bilaterally. Skeleton: No cervical spine fracture detected. Cervical spondylotic changes with kyphosis centered at C5 level with various degrees of spinal stenosis and foraminal narrowing most prominent C5 level. No abnormal prevertebral soft tissue swelling. Other neck: Left lobe of thyroid nodules measuring up to 1.4 cm 2.2 cm can be assessed with elective thyroid ultrasound. CTA HEAD Anterior circulation: Anterior circulation without medium or large size vessel significant stenosis or occlusion. Mild narrowing cavernous segment internal carotid artery bilaterally. The A2 segment of the anterior cerebral arteries are displaced slightly superiorly by the hematoma located superior to the corpus callosum. No posttraumatic aneurysm or occlusion of the A2 segment of the anterior cerebral artery is noted. No saccular aneurysm detected. Posterior circulation: Mild irregularity narrowing of the distal vertebral  arteries and basilar artery without high-grade stenosis. Venous sinuses: Patent. Anatomic variants: Negative. Delayed phase: As above. IMPRESSION: CT HEAD 1.2 x 2 x 1.7 cm hematoma superior aspect of the corpus callosum causing mild mass effect upon the body corpus callosum. Small amount of subarachnoid blood anterior falx level. Subarachnoid hemorrhage posterior right frontal -parietal lobe (central sulcus) measuring up to 1.4 cm. Tiny amount of deep dependent blood within the lateral ventricles suspected. Tentorial hematoma greater on the right measuring 1.4 x 1 x 1.3 cm. Atrophy. Ventricular prominence may be related to atrophy rather hydrocephalus and without change. No intracranial mass separate from above described findings nor area of abnormal enhancement. CTA NECK No significant stenosis of the right carotid artery. Ectatic cervical segment of the right internal carotid artery with prominent folds. Mild ectasia of the distal cervical segment of the right internal carotid artery proximal to the skullbase. Plaque without significant narrowing of the distal left common carotid artery/bifurcation. Markedly ectatic left internal carotid artery cervical segment. Fibromuscular dysplasia may be present. In addition to regions of narrowing, there is fusiform dilation of the left internal carotid artery proximal to the skullbase measuring up to 11.8 mm. Both vertebral arteries are patent without evidence of dissection. Vertebral arteries are ectatic at the C2 level bilaterally. No cervical spine fracture detected. Cervical spondylotic changes with kyphosis centered at C5 level with various degrees of spinal stenosis and foraminal narrowing most prominent C5 level. No abnormal prevertebral soft tissue swelling. Left lobe of thyroid nodules measuring up to 1.4 cm 2.2 cm can be assessed with elective thyroid ultrasound. CTA HEAD Anterior circulation without medium or large size vessel significant stenosis or occlusion.  Mild narrowing cavernous segment internal carotid artery bilaterally. A2 segment of the anterior cerebral arteries are displaced slightly superiorly by the hematoma located superior to the corpus callosum. No posttraumatic aneurysm or occlusion of the A2 segment of the anterior cerebral artery. No saccular aneurysm detected. Mild irregularity narrowing of the distal vertebral arteries and basilar artery without high-grade stenosis. Electronically Signed   By: Genia Del M.D.   On: 07/25/2015 17:30   Mr Hilary Hertz  07/25/2015  CLINICAL DATA:  Initial evaluation for acute intracranial hemorrhage. Evaluate for dural sinus thrombosis. EXAM: MR VENOGRAM the HEAD WITHOUT CONTRAST TECHNIQUE: Angiographic images of the intracranial venous structures were obtained using MRV technique without intravenous contrast. COMPARISON:  None. FINDINGS: Study is somewhat limited by lack of IV contrast. The major dural sinuses appear patent. Specifically, the superior sagittal sinus, right transverse sinus, right sigmoid sinus, and proximal right internal jugular vein are widely patent. The superior sagittal sinus is partially fenestrated. The left transverse sinus is hypoplastic, with the  left the distal transverse and sigmoid sinuses largely supplied via the vein of Labbe. Proximal left internal jugular vein is patent. Straight sinus, vein of Galen, and internal cerebral veins appear patent. No evidence for dural sinus thrombosis. No obvious cortical vein thrombosis. Parafalcine hemorrhage and small portal hemorrhages again noted, better evaluated on prior CT. IMPRESSION: Normal MRV.  No evidence for dural sinus thrombosis. Electronically Signed   By: Jeannine Boga M.D.   On: 07/25/2015 23:51   Ir Gastrostomy Tube Mod Sed  08/03/2015  INDICATION: Stroke EXAM: PERC PLACEMENT GASTROSTOMY MEDICATIONS: ; Antibiotics were administered within 1 hour of the procedure. Glucagon 1 mg IV ANESTHESIA/SEDATION: Versed 0.5 mg IV;  Fentanyl 50 mcg IV Moderate Sedation Time:  15 The patient was continuously monitored during the procedure by the interventional radiology nurse under my direct supervision. CONTRAST:  6mL ISOVUE-300 IOPAMIDOL (ISOVUE-300) INJECTION 61% - administered into the gastric lumen. FLUOROSCOPY TIME:  Fluoroscopy Time: 3 minutes 6 seconds (4 mGy). COMPLICATIONS: None immediate. PROCEDURE: The procedure, risks, benefits, and alternatives were explained to the patient. Questions regarding the procedure were encouraged and answered. The patient understands and consents to the procedure. The epigastrium was prepped with Betadine in a sterile fashion, and a sterile drape was applied covering the operative field. A sterile gown and sterile gloves were used for the procedure. A 5-French orogastric tube is placed under fluoroscopic guidance. Scout imaging of the abdomen confirms barium within the transverse colon. The stomach was distended with gas. Under fluoroscopic guidance, an 18 gauge needle was utilized to puncture the anterior wall of the body of the stomach. An Amplatz wire was advanced through the needle passing a T fastener into the lumen of the stomach. The T fastener was secured for gastropexy. A 9-French sheath was inserted. A snare was advanced through the 9-French sheath. A Britta Mccreedy was advanced through the orogastric tube. It was snared then pulled out the oral cavity, pulling the snare, as well. The leading edge of the gastrostomy was attached to the snare. It was then pulled down the esophagus and out the percutaneous site. It was secured in place. Contrast was injected. The image demonstrates placement of a 20-French pull-through type gastrostomy tube into the body of the stomach. IMPRESSION: Successful 20 French pull-through gastrostomy. Electronically Signed   By: Marybelle Killings M.D.   On: 08/03/2015 17:17   Dg Chest Portable 1 View  07/25/2015  CLINICAL DATA:  Recent fall with difficulty moving EXAM: PORTABLE  CHEST 1 VIEW COMPARISON:  06/13/2015 FINDINGS: Cardiac shadow is within normal limits. The thoracic aorta is tortuous but otherwise within normal limits. The lungs are well aerated bilaterally without focal infiltrate. No acute bony abnormality is seen. IMPRESSION: No active disease. Electronically Signed   By: Inez Catalina M.D.   On: 07/25/2015 13:16   Dg Abd Portable 1v  08/01/2015  CLINICAL DATA:  Feeding tube placement EXAM: PORTABLE ABDOMEN - 1 VIEW COMPARISON:  None. FINDINGS: Feeding tube is in place with the tip in the descending duodenum. Nonobstructive bowel gas pattern. Lung bases are clear. IMPRESSION: Feeding tube tip in the second portion of the duodenum. Electronically Signed   By: Rolm Baptise M.D.   On: 08/01/2015 13:55    Microbiology: No results found for this or any previous visit (from the past 240 hour(s)).   Labs: CBC:  Recent Labs Lab 07/31/15 0544 08/01/15 0352 08/04/15 0630  WBC 8.0 9.9 8.8  HGB 13.2 13.6 12.2  HCT 40.4 41.6 37.1  MCV  90.4 91.4 90.5  PLT 223 238 [id number]   Basic Metabolic Panel:  Recent Labs Lab 07/29/15 1044 07/31/15 0544 08/01/15 0352 08/03/15 0641 08/04/15 0630 08/05/15 0530  NA  --  142 145 145 146* 143  K  --  3.7 4.0 3.5 3.9 3.5  CL  --  109 112* 111 112* 108  CO2  --  23 23 24 27 24   GLUCOSE  --  117* 123* 125* 129* 128*  BUN  --  23* 29* 21* 21* 24*  CREATININE  --  0.80 0.91 0.63 0.71 0.72  CALCIUM  --  8.8* 8.7* 8.8* 8.8* 8.7*  MG 2.1  --   --   --  1.8  --    Liver Function Tests:  Recent Labs Lab 07/31/15 0544 08/01/15 0352  AST 21 22  ALT 15 13*  ALKPHOS 64 62  BILITOT 0.8 0.9  PROT 5.8* 5.7*  ALBUMIN 2.4* 2.6*   No results for input(s): LIPASE, AMYLASE in the last 168 hours.  Recent Labs Lab 07/30/15 1521 07/31/15 1226  AMMONIA 39* 43*   Cardiac Enzymes: No results for input(s): CKTOTAL, CKMB, CKMBINDEX, TROPONINI in the last 168 hours. BNP (last 3 results) No results for input(s): BNP in the last  8760 hours. CBG:  Recent Labs Lab 08/04/15 1648 08/04/15 2011 08/05/15 0029 08/05/15 0421 08/05/15 0742  GLUCAP 121* 109* 130* 125* 122*   Time spent: 30 minutes  Signed:  Stanton Kissoon  [facility]  08/05/2015  , 9:34 AM

## 2015-08-05 NOTE — Clinical Social Work Placement (Signed)
   CLINICAL SOCIAL WORK PLACEMENT  NOTE  Date:  08/05/2015  Patient Details  Name: Brooke Watson MRN: [id number] Date of Birth: 03-Jan-1934  Clinical Social Work is seeking post-discharge placement for this patient at the [place] level of care (*CSW will initial, date and re-position this form in  chart as items are completed):  Yes   Patient/family provided with [facility] Work Department's list of facilities offering this level of care within the geographic area requested by the patient (or if unable, by the patient's family).  Yes   Patient/family informed of their freedom to choose among providers that offer the needed level of care, that participate in Medicare, Medicaid or managed care program needed by the patient, have an available bed and are willing to accept the patient.  Yes   Patient/family informed of [facility]'s ownership interest in [facility] and [facility], as well as of the fact that they are under no obligation to receive care at these facilities.  PASRR submitted to EDS on 08/03/15     PASRR number received on 08/03/15     Existing PASRR number confirmed on       FL2 transmitted to all facilities in geographic area requested by pt/family on 08/03/15     FL2 transmitted to all facilities within larger geographic area on       Patient informed that his/her managed care company has contracts with or will negotiate with certain facilities, including the following:            Patient/family informed of bed offers received.  Patient chooses bed at [place], [place]     Physician recommends and patient chooses bed at      Patient to be transferred to [facility] on 08/05/15.  Patient to be transferred to facility by [facility]     Patient family notified on 08/05/15 of transfer.  Name of family member notified:  daughter [name] and husband at bedside      PHYSICIAN Please sign FL2     Additional  Comment:    _______________________________________________ Dulcy Fanny, LCSW 08/05/2015, 12:41 PM

## 2015-08-05 NOTE — Clinical Social Work Note (Signed)
Patient will discharge today per MD order. Patient will discharge to: [facility] RN to call report prior to transportation to: [phone] Transportation: [facility] called at 12:30  CSW sent discharge summary to SNF for review.   Daughter was updated re: dc plans.  Nonnie Done, LCSW [phone]  [id number], 2S 15-16 and Psychiatric Service Line  Licensed Clinical Social Worker

## 2015-08-05 NOTE — Progress Notes (Signed)
Pt with discharge orders.  Pt dressed, IV removed, PEG tube clamped and all belongings packed.  Report called to [facility] at [facility].  Transported via [facility].  Cori Razor, RN

## 2015-08-23 ENCOUNTER — Ambulatory Visit (INDEPENDENT_AMBULATORY_CARE_PROVIDER_SITE_OTHER): Payer: Medicare Other | Admitting: Neurology

## 2015-08-23 ENCOUNTER — Encounter: Payer: Self-pay | Admitting: Neurology

## 2015-08-23 VITALS — BP 120/90 | HR 95 | Ht 65.0 in

## 2015-08-23 DIAGNOSIS — S0990XA Unspecified injury of head, initial encounter: Secondary | ICD-10-CM | POA: Diagnosis not present

## 2015-08-23 DIAGNOSIS — G20 Parkinson's disease: Secondary | ICD-10-CM

## 2015-08-23 DIAGNOSIS — F0391 Unspecified dementia with behavioral disturbance: Secondary | ICD-10-CM | POA: Diagnosis not present

## 2015-08-23 DIAGNOSIS — F03918 Unspecified dementia, unspecified severity, with other behavioral disturbance: Secondary | ICD-10-CM

## 2015-08-23 NOTE — Progress Notes (Signed)
[id number]    Provider:  Dr Jaynee Eagles Referring Provider: Raina Mina., MD Primary Care Physician:  Gilford Rile, MD  CC: Dementia with behavioral disturbance, fall with head trauma  Interval history 08/24/2015: Patient returns today for follow-up after hospitalization. Patient was hospitalized earlier in April after a fall. Findings on CT revealed multiple areas of blood, hemorrhage of the corpus callosum, subarachnoid blood, subdural hematomas. She was diagnosed with acute encephalopathy, dehydration, acute cerebral hemorrhage, accelerated hypertension, middle-aged addition of moderate degree, dysphasia, intracerebral hemorrhage. EEG did not show any acute evidence of seizure epileptogenic foci that showed encephalopathy/slowing. Notes state that patient tremor improved in the hospital but I do see bilateral resting tremor today on exam. She was discharged to subacute nursing facility, a PEG tube was implanted before discharge. Husband is very sad, or worried about her quality of life. She is Not really talking, not walking, no agitation, she sometimes recognizes husband. She is still in the nursing home. They are going to hire someone to help at home. No improvement at all in her condition since leaving the hospital. She is eating ice cream otherwise uses a feeding tube. She is here with her husband provides all the information. Also discussed case with daughter on the phone.  Interval history 07/14/2015: Patient was recently hospitalized and diagnosed with acute encephalopathy. She was diagnosed with acute kidney injury and improved on fluids. CT and MRI of the brain and EEG found no etiology blood cultures 2 were negative, urine cultures negative, chest x-ray negative, ammonia level normal, TSH normal. She was continued on Depakote, Aricept and Namenda. Dr. Amalia Greenhouse was contacted for medical management. Seroquel was held and eventually restarted. They're asked to discontinue  Benadryl. She is getting physical therapy at home. Husband doesn't know what pills she is getting will call daughter. In the afternoon, she wants to "go home" and she gets confused. He will give her haldol only if she gets agitated and she can't be redirected. She says she "I think i am doing well for a 72-something women". She is gaining weight. Husband is happy, feels ;ike everything is doing better. She is drinking more water. She is taking Depakote 250mg  qam, seroquel 100mg  qpm and possibly an additional 50mg  seroquel if needed.   Addendum Feb 2157: 82 year old female who is here for follow up of Dementia with behavioral disturbances and delusions. She is having delusions, wandering, getting lost, calling the police, severely agitated at times.Significant decline in the last year. She lives with her husband of 31 years and they live independently. I have highly recommended memory unit for patient but family is trying desperately to keep her at home despite recommendations. Patient was seen by Dr. Casimiro Needle, geriatric psychiatry, for her late-stage dementia with severe behavioral disturbances. He recommended Depakote 500mg  qday, Seroquel 100mg  qhs and 50mg  qam and Haldol up to 0,5mg  bid prn as needed for severe agitation. Changes made.   Addendum 05/09/2015: Had a long discussion with daughter. Patient has late stage dementia and is still living at home with her husband. Daughter and husband are trying desperately to keep [place] at home. I have encouraged them to place her in a memory unit. She has increasing agitation, delusions, calling the police and locking her husband out of the house, not knowing her husband. Currently she is on Seroquel 200 mg extended release with Haldol(0.25mg  twice daily prn dispensed 20 tabs monthly) only as needed for severe agitation that is not redirectable and might end in harm  to the patient. I have on countless occasions discussed that these types of medications carry  black box warnings and increase risk for morbidity and mortality. I have advised the family to follow up countless times with either Dr. Adele Schilder or [facility] who are geriatric psychiatrists. They do have an appointment with Dr. Casimiro Needle at the end of February. In the meantime have increased the Seroquel 200 mg extended release and when necessary Haldol only as needed for severe agitation that is not redirectable. I informed the daughter and the husband that at this point I don't feel comfortable in increasing any more medications. She will have to follow with geriatric psychiatry. And again my recommendation is for memory unit.   Update 04/14/2015: She is getting agitated in the afternoons. She is on 75mg  Seroquel twice day IR and will change to long-acting 150mg  once daily. She is getting confused especially in the afternoons. She worries about her money, that someone is stealing her money She thinks someone is taking her shoes. She is a little agitated today, a little argumentative but redirectable. Her dementia is progressing, she is having more delusions, she is forgetting more. She has gained weight, her appetite is good. She sleeps all night, sleeps well. Occ she gets up and wanders but she is redirectable. Her neck still hurts.   Update 01/06/2015: Here for follow up. The medication is working. In the afternoon she may have a "spell" but nothing like the behavioral issues as previously. This is less confusing for father. She sundowns in the evening around 4pm but it is manageable. When he gives he a haldol it is managable. Sister helps, daughter talks to him twice a day. Her neck is a little stiff but it doesn't bother her. Patient says "I am doing very well, no problems". She is eating well and sleeping well. No more wandering. If she wants to walk, they go with her and she is fine in an hour.   She is on Seroquel 50mg  qam and 75mg qpm. Can consider changing to XR once daily version but hate to change  medication regimen, patient's husband gets confused with changes - and may be more expensive. Haldol very low dose and very sparingly - only for severe agitation when she can't be redirected and her safety is of concern.   Update 09/30/2014: AARNAVI STRODE is a 82 y.o. female here as a follow up for Dementia with behavioral disturbance. Husband accompanies patient and provides most of the information. They have been married for approximately 67 years and the husband is trying desperately to keep his wife at home. Recently she has become more forgetful, and often agitated and running to the police when she doesn't recognize her husband, wandering. Have been talking with patient's husband and patient's daughter extensively in recent months and recommending that patient placed in a care facility. Husband is quite earnest however he himself is having difficulty managing medications and managing patient.  Her medications were changed from Risperdal to Seroquel twice daily due to extrapyramidal symptoms. Haldol was limited to 5 tabs per month and only to be given for extreme agitation or behavior that severe and not redirectable.   She had a "spell" Monday night, she didn't know husband, couldn't reorient her. He gave her extra seroquel and haldol. This happens once a month. Patient says "I don't even know what he is talking about". No falls. She is walking twice a week with sister a half mile. BP was 167/102 in the office but they  have not restarted BP medications. She has some pain in the neck, tremor and some decreased ROM in the neck. Every once in a while she has "spells" and she she gets agitated, wants to leave the house and "go home". She is sleeping well at night, no wandering, not leaving the bedroom. Walking is fine. Patient says "I feel fine" except for the "crick in my neck". Neck hurts sometimes, tightness. Heating pad helps.   Highly encouraged f/u with pcp and restarting BP medications.     History 08/30/2014: Patient was given Risperdal bid to help manage patient's delusions with prn Haldol ONLY if patient became aggressive, however husband was overdosing patient which likely contributed to falls as well extrapyramidal symptoms. Patient has had several falls recently, and today presents with neck pain and a new lesion on her right collarbone.  Patient is getting 0.5mg  Risperdal daily in the morning for agitation. Her neck hurts today since a fall. She fell a week ago on Monday. She had CT of the neck and sacrum 2 days ago which did not show fractures. This morning husband noticed a bruise on her collar bone. Her neck is very sore. She is not having behavioral issues anymore. She can't move her neck though. No tremors or stiffness in the rest of the body. She takes benadryl every night for sleep and it doesn't help with the neck stiffness. Husband tried to stop the risperdal a few weeks ago and she started becoming agitated, wanting to go outside and walk, wanted to see her deceased mother. He restarted the Risperdal.  She appears to be having a dystonic reaction in her neck today. She has a red lesion on her right collar bone unclear if it is a new bruise or burn.   Will stop the risperdal and haldol and dispose of meds. Will start low dose Seroquel as this has fewer EPS side effects. Will start at 25mg  bid and slowly increase to 50mg  bid if needed.  Review of Systems: Patient complains of symptoms per HPI as well as the following symptoms: neck pain, no CP, no SOB. Pertinent negatives per HPI. All others negative.   Social History   Social History  . Marital Status: Married    Spouse Name: Alveta Heimlich  . Number of Children: 1  . Years of Education: 12   Occupational History  . Retired [employer]   Social History Main Topics  . Smoking status: Former Research scientist (life sciences)  . Smokeless tobacco: Never Used  . Alcohol Use: No  . Drug Use: No  . Sexual Activity: No   Other Topics  Concern  . Not on file   Social History Narrative   Patient is married. Alveta Heimlich   Patient has a daughter.   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    Caffeine use: none     Family History  Problem Relation Age of Onset  . Dementia Neg Hx     Past Medical History  Diagnosis Date  . High cholesterol   . Cognitive decline   . Dementia   . Hypertension   . Thyroid disease   . UTI (urinary tract infection)   . Arthritis     Past Surgical History  Procedure Laterality Date  . Colon surgery      Current Outpatient Prescriptions  Medication Sig Dispense Refill  . atorvastatin (LIPITOR) 10 MG tablet 10 mg by PEG Tube route at bedtime. Reported on 08/23/2015  6  . baclofen (LIORESAL) 10 MG tablet Place 0.5 tablets (5 mg total) into feeding tube at bedtime. 20 each 0  . levothyroxine (SYNTHROID, LEVOTHROID) 50 MCG tablet 50 mcg by PEG Tube route daily before breakfast. Reported on 08/23/2015    . Amino Acids-Protein Hydrolys (FEEDING SUPPLEMENT, PRO-STAT SUGAR FREE 64,) LIQD Place 30 mLs into feeding tube daily. (Patient not taking: Reported on 08/23/2015) 900 mL 0  . amLODipine (NORVASC) 5 MG tablet Take 5 mg by mouth daily. Reported on 08/23/2015    . cloNIDine (CATAPRES - DOSED IN MG/24 HR) 0.3 mg/24hr patch Place 1 patch (0.3 mg total) onto the skin once a week. (Patient not taking: Reported on 08/23/2015) 4 patch 12  . Nutritional Supplements (FEEDING SUPPLEMENT, JEVITY 1.2 CAL,) LIQD Place 1,000 mLs into feeding tube continuous. (Patient not taking: Reported on 08/23/2015) 1000 mL 0   No current facility-administered medications for this visit.    Allergies as of 08/23/2015 - Review Complete 08/23/2015  Allergen Reaction Noted  . Ambien [zolpidem tartrate]  10/23/2013  . Amoxicillin  10/23/2013    Vitals: BP 120/90 mmHg  Pulse 95  Ht 5\' 5"  (1.651 m)  SpO2 97% Last Weight:  Wt Readings from Last 1 Encounters:  08/05/15 147 lb 7.8  oz (66.9 kg)   Last Height:   Ht Readings from Last 1 Encounters:  08/23/15 5\' 5"  (1.651 m)    PHYSICAL EXAM Frail elderly african [place] lady who is in a wheelchair. Afebrile. Cardiac exam no murmur or gallop. Lungs are clear to auscultation. Distal pulses are well felt. Neurological Exam : Patient has dystonic neck posturing, is mumbling to herself, rarely makes eye contact, does she she is doing "perfect" when you ask her how she is. Otherwise she is nonverbal except for mumbling. She appears to minimally track across the room. Occasionally she will look at you if you try to make contact with her. She does not appear to blink to threat. Her voice is hypophonic. Pupils are equally round and reactive to light. Face appears symmetric. Does not follow any commands. Motor system exam reveals bilateral resting tremor and significantly increased tone in all 4 extremities as well as increased axial tone. Could not perform strength testing due to patient's mental status. She appears to be moving her arms minimally but not her legs. She does move all extremities to stimulation.  Assessment/Plan: 82 year old female who is here for follow up of Dementia with behavioral disturbances and delusions. Patient had a serious fall multiple hemorrhages, subdural hematomas and subarachnoid hemorrhage. Patient was on anti-dopaminergic medication previous to this due to significant behavioral disturbances secondary to end-stage dementia. Parkinsonism was thought secondary to antipsychotic medications even previous to her head trauma. She is off her medications currently. She exhibits significantly increased tone in bilateral extremities and increased tone axially as well as bilateral resting tremor.   - given her significant parkinsonism on exam, recommend at least trying low-dose Sinemet to see if this can help with her rigidity. Recommend starting Sinemet 25-100, half pill 3 times a day. There are no significant side  effects may consider increasing it to a whole pill 3 times a day if needed and tolerated.  Need to watch very carefully  side effects especially worsening confusion.Common side effects of Sinemet include: dizziness, drowsiness, blurred vision, nausea, vomiting, dry mouth, loss of appetite, heartburn, diarrhea, constipation, sneezing, stuffy nose, cough, other cold symptoms, muscle pain, numbness or tingly feeling, trouble sleeping (insomnia or strange dreams),  skin rash, itching, and headache. Unlikely but serious side effects including: greatly increased eye blinking/twitching, fainting, mental/mood changes (e.g., confusion, depression, hallucinations, thoughts of suicide), unusual strong urges (such as increased gambling, increased sexual urges), or worsening of involuntary movements/spasms.  --Try to separate Sinemet from food (especially protein-rich foods like meat, dairy, eggs) by about 30-60 mins - this will help the absorption of the medication. If you have some nausea with the medication, you can take it with some light food like crackers or ginger ale    Sarina Ill, MD  [facility] [address] [place] [address], [place] [address]  Phone [phone] Fax [phone]  A total of 30 minutes was spent face-to-face with this patient. Over half this time was spent on counseling patient on the intracranial hemorrhage, parkinsonism, dementia  diagnosis and different diagnostic and therapeutic options available.

## 2015-08-24 MED ORDER — CARBIDOPA-LEVODOPA 25-100 MG PO TABS: 0.5000 | ORAL_TABLET | Freq: Three times a day (TID) | ORAL | Status: DC

## 2015-08-25 ENCOUNTER — Encounter: Payer: Self-pay | Admitting: *Deleted

## 2015-08-25 NOTE — Progress Notes (Signed)
Faxed Dr Cathren Laine recent office note to [facility] where pt resides per Dr Jaynee Eagles request. Fax: [phone]. Received confirmation.

## 2015-09-19 ENCOUNTER — Other Ambulatory Visit: Payer: Self-pay | Admitting: Neurology

## 2015-09-19 ENCOUNTER — Telehealth: Payer: Self-pay | Admitting: Neurology

## 2015-09-19 DIAGNOSIS — S065XAA Traumatic subdural hemorrhage with loss of consciousness status unknown, initial encounter: Secondary | ICD-10-CM

## 2015-09-19 DIAGNOSIS — S065X9A Traumatic subdural hemorrhage with loss of consciousness of unspecified duration, initial encounter: Secondary | ICD-10-CM

## 2015-09-19 DIAGNOSIS — I618 Other nontraumatic intracerebral hemorrhage: Secondary | ICD-10-CM

## 2015-09-19 NOTE — Telephone Encounter (Signed)
We need a repeat CT of the head. I will order it. She can have it done and then follow up with me afterwards. That is wonderful news!  Brooke Watson

## 2015-09-19 NOTE — Telephone Encounter (Signed)
I have spoken with Brooke Watson and per AA, advised that she received update and is pleased; will order new ct head, then see pt. back to discuss results and stroke prevention once ct is complete.  Brooke Watson verbalized understanding of same/fim

## 2015-09-19 NOTE — Telephone Encounter (Signed)
Pt's daughter , Brooke Watson , called to give update : pt is now eating , no G-Tube. Not a lot of movement but less ridged.  Question: It's been eight weeks since brain bleed. When should she restart a blood thinner.  When should pt come see her?

## 2015-09-29 ENCOUNTER — Telehealth: Payer: Self-pay | Admitting: Neurology

## 2015-09-29 NOTE — Telephone Encounter (Signed)
Ilene Robins/[facility] called inquiring about scheduling CT scan. Please call her at [phone]

## 2015-10-03 ENCOUNTER — Telehealth: Payer: Self-pay | Admitting: *Deleted

## 2015-10-03 NOTE — Telephone Encounter (Signed)
Dr Jaynee Eagles- did you want to send order until CM, NP sees her? She is seeing her on 10/10/15

## 2015-10-03 NOTE — Telephone Encounter (Signed)
Called Tina back. Advised Dr Jaynee Eagles is okay with placing order. Advised her to keep appt with CM, NP on 10/10/15 at 1130am. She is going to arrange transportation for her mother. I gave her fax [phone] to fax order. She is going to call and find out exactly what is needed for home health order.

## 2015-10-03 NOTE — Telephone Encounter (Signed)
Does she need to be see before we place the in-home requests? I hate to make her travel here if she doesn;t need to.

## 2015-10-03 NOTE — Telephone Encounter (Signed)
Pt's daughter called, has a question about home care orders. Please advise [phone]

## 2015-10-03 NOTE — Telephone Encounter (Signed)
Patient was scheduled at [facility].

## 2015-10-03 NOTE — Telephone Encounter (Signed)
Dr Jaynee Eagles- Are you okay with this? See below Called daughter back. She stated she is coming home this weekend to get mother home from rehab. She has been having problems with Dr Willette Pa office and wondering if Dr Jaynee Eagles can place order for home health care. Every time she calls she states "Dr Bea Graff does not believe in collaboration". She states they would like to use [facility] again. Daughter stated they did a hospice evaluation for her and stated at this time, she does not qualify. Advised they usually like to have a office visit within the last 30 days. It has been over 30 days. Scheduled f/u with CM, NP for 7/3 at 1130am. Advised I will speak to Dr Jaynee Eagles and make sure this is ok. I will call her back with an update. She verbalized understanding.

## 2015-10-03 NOTE — Telephone Encounter (Signed)
I am fine with placing a referral to home health, can you do this Brooke Watson?

## 2015-10-04 ENCOUNTER — Ambulatory Visit
Admission: RE | Admit: 2015-10-04 | Discharge: 2015-10-04 | Disposition: A | Discharge status: Home / Self Care | Payer: Medicare Other | Source: Ambulatory Visit | Attending: Neurology | Admitting: Neurology

## 2015-10-04 DIAGNOSIS — S065X9A Traumatic subdural hemorrhage with loss of consciousness of unspecified duration, initial encounter: Secondary | ICD-10-CM

## 2015-10-04 DIAGNOSIS — I618 Other nontraumatic intracerebral hemorrhage: Secondary | ICD-10-CM

## 2015-10-04 DIAGNOSIS — S065XAA Traumatic subdural hemorrhage with loss of consciousness status unknown, initial encounter: Secondary | ICD-10-CM

## 2015-10-04 NOTE — Telephone Encounter (Signed)
Dr Jaynee Eagles- the patient has to be seen within the last 30 days to have supporting documentation for home health. She has not seen [place] or PCP within the last 30 days.

## 2015-10-08 ENCOUNTER — Telehealth: Payer: Self-pay | Admitting: Neurology

## 2015-10-08 NOTE — Telephone Encounter (Signed)
I spoke to patient's daughter re: hydrocephalus. We will need to repeat CT of the head in a few weeks or if she has symptoms if family would like intervention if needed, I recommend against intervention however. Patient is coming in on Monday to see an NP and I will be available by phone for the appointment. She has a complicated history. I was going to see her on Wednesday but patient's daughter is only in town on Monday and wanted to come to appointment.  Patient has significant rigidity since head trauma. The significant rigidity did not occur until after the head trauma so I suspect it is of this etiology. We started a little sinemet and daughter is going to pay attention if it helps, we could always increase to 2 pills bid (she cannot take pills more than 2x a day due to her care issues at home)  This is a very difficult case.   If she is coming on on Monday we can cancel the appointment on Wednesday with me thanks.

## 2015-10-10 ENCOUNTER — Telehealth: Payer: Self-pay | Admitting: Neurology

## 2015-10-10 ENCOUNTER — Ambulatory Visit (INDEPENDENT_AMBULATORY_CARE_PROVIDER_SITE_OTHER): Payer: Medicare Other | Admitting: Nurse Practitioner

## 2015-10-10 ENCOUNTER — Encounter: Payer: Self-pay | Admitting: Nurse Practitioner

## 2015-10-10 VITALS — BP 140/94 | HR 80

## 2015-10-10 DIAGNOSIS — S06369A Traumatic hemorrhage of cerebrum, unspecified, with loss of consciousness of unspecified duration, initial encounter: Secondary | ICD-10-CM

## 2015-10-10 DIAGNOSIS — I611 Nontraumatic intracerebral hemorrhage in hemisphere, cortical: Secondary | ICD-10-CM | POA: Diagnosis not present

## 2015-10-10 DIAGNOSIS — R531 Weakness: Secondary | ICD-10-CM

## 2015-10-10 DIAGNOSIS — G20 Parkinson's disease: Secondary | ICD-10-CM | POA: Diagnosis not present

## 2015-10-10 DIAGNOSIS — I6200 Nontraumatic subdural hemorrhage, unspecified: Secondary | ICD-10-CM | POA: Diagnosis not present

## 2015-10-10 DIAGNOSIS — S065XAA Traumatic subdural hemorrhage with loss of consciousness status unknown, initial encounter: Secondary | ICD-10-CM

## 2015-10-10 DIAGNOSIS — S065X9A Traumatic subdural hemorrhage with loss of consciousness of unspecified duration, initial encounter: Secondary | ICD-10-CM

## 2015-10-10 NOTE — Telephone Encounter (Signed)
Cx appt for Wed with Dr Jaynee Eagles per Dr Jaynee Eagles request.

## 2015-10-10 NOTE — Patient Instructions (Signed)
Increase Sinemet to 1 tab twice daily Restart baby aspirin Feeding tube would need to be pulled by GI No follow up planned will handle issues over the phone per Dr. Jaynee Eagles

## 2015-10-10 NOTE — Telephone Encounter (Signed)
I called and spoke to [place] with [facility] and gave the verbal order for 1 x week for 4 weeks, as per below.  I called and spoke to [facility], she relayed that pt has [place] orders already in place for PT/OT/ST and Nursing.  [facility] in [place].  [phone].

## 2015-10-10 NOTE — Telephone Encounter (Signed)
I thought they had already been placed according to Dr. Jaynee Eagles. Go ahead and place them.

## 2015-10-10 NOTE — Progress Notes (Addendum)
[facility]  PATIENT: Brooke Watson DOB: June 22, 1933   REASON FOR VISIT: Follow-up for head trauma, parkinsonism and dementia with behavioral disturbance HISTORY FROM: Husband and daughter    HISTORY OF PRESENT ILLNESS:UPDATE 10/10/15 Brooke Watson 82 year old returns for followup. She is transported by ambulance. She got out of [facility] last Friday. Patient returns today because she needs an evaluation for home health services which has  been ordered.(PT/ST/RN to come out today and tomorrow, She has CNA, BID (2.5 hours), trying to get one in middle of day.  She continues with a PEG tube which the daughter wants pulled at some time. The patient has a DO NOT RESUSCITATE in place. Patient is not really talking,  nonambulatory and apparently no improvement since last seen by Dr. Jaynee Eagles 08/24/2015. Her extremities remain rigid. She returns for reevaluation     Interval history 08/24/2015: Patient returns today for follow-up after hospitalization. Patient was hospitalized earlier in April after a fall. Findings on CT revealed multiple areas of blood, hemorrhage of the corpus callosum, subarachnoid blood, subdural hematomas. She was diagnosed with acute encephalopathy, dehydration, acute cerebral hemorrhage, accelerated hypertension, middle-aged addition of moderate degree, dysphasia, intracerebral hemorrhage. EEG did not show any acute evidence of seizure epileptogenic foci that showed encephalopathy/slowing. Notes state that patient tremor improved in the hospital but I do see bilateral resting tremor today on exam. She was discharged to subacute nursing facility, a PEG tube was implanted before discharge. Husband is very sad, or worried about her quality of life. She is Not really talking, not walking, no agitation, she sometimes recognizes husband. She is still in the nursing home. They are going to hire someone to help at home. No improvement at all in her condition since  leaving the hospital. She is eating ice cream otherwise uses a feeding tube. She is here with her husband provides all the information. Also discussed case with daughter on the phone.  Interval history 07/14/2015: Patient was recently hospitalized and diagnosed with acute encephalopathy. She was diagnosed with acute kidney injury and improved on fluids. CT and MRI of the brain and EEG found no etiology blood cultures 2 were negative, urine cultures negative, chest x-ray negative, ammonia level normal, TSH normal. She was continued on Depakote, Aricept and Namenda. Dr. Amalia Greenhouse was contacted for medical management. Seroquel was held and eventually restarted. They're asked to discontinue Benadryl. She is getting physical therapy at home. Husband doesn't know what pills she is getting will call daughter. In the afternoon, she wants to "go home" and she gets confused. He will give her haldol only if she gets agitated and she can't be redirected. She says she "I think i am doing well for a 72-something women". She is gaining weight. Husband is happy, feels ;ike everything is doing better. She is drinking more water. She is taking Depakote 250mg  qam, seroquel 100mg  qpm and possibly an additional 50mg  seroquel if needed.   REVIEW OF SYSTEMS: Full 14 system review of systems performed and notable only for those listed, all others are neg:  Constitutional: neg  Cardiovascular: neg Ear/Nose/Throat: neg  Skin: neg Eyes: neg Respiratory: neg Gastroitestinal: neg  Hematology/Lymphatic: neg  Endocrine: neg Musculoskeletal:neg Allergy/Immunology: neg Neurological: Intracranial bleed Psychiatric: neg Sleep : neg   ALLERGIES: Allergies  Allergen Reactions  . Ambien [Zolpidem Tartrate]     Disorientation   . Amoxicillin     Unknown.  Has patient had a PCN reaction causing immediate rash, facial/tongue/throat swelling,  SOB or lightheadedness with hypotension: unknown Has patient had a PCN reaction causing  severe rash involving mucus membranes or skin necrosis: unknown Has patient had a PCN reaction that required hospitalization No Has patient had a PCN reaction occurring within the last 10 years: unknown If all of the above answers are "NO", then may proceed with Cephalosporin use.     HOME MEDICATIONS: Outpatient Prescriptions Prior to Visit  Medication Sig Dispense Refill  . atorvastatin (LIPITOR) 10 MG tablet Take 10 mg by mouth at bedtime. Reported on 08/23/2015  6  . baclofen (LIORESAL) 10 MG tablet Place 0.5 tablets (5 mg total) into feeding tube at bedtime. (Patient taking differently: Place 5 mg into feeding tube 2 (two) times daily. ) 20 each 0  . carbidopa-levodopa (SINEMET IR) 25-100 MG tablet Take 0.5 tablets by mouth 3 (three) times daily. Separate by 4 hours apart for example 8am, noon, and 4 PM. Separate from protein by at least 30-60 minutes. (Patient taking differently: Take 0.5 tablets by mouth 2 (two) times daily. Separate by 4 hours apart for example 8am, noon, and 4 PM. Separate from protein by at least 30-60 minutes.) 30 tablet 11  . levothyroxine (SYNTHROID, LEVOTHROID) 50 MCG tablet Take 50 mcg by mouth daily before breakfast. Reported on 08/23/2015    . Amino Acids-Protein Hydrolys (FEEDING SUPPLEMENT, PRO-STAT SUGAR FREE 64,) LIQD Place 30 mLs into feeding tube daily. (Patient not taking: Reported on 08/23/2015) 900 mL 0  . amLODipine (NORVASC) 5 MG tablet Take 5 mg by mouth daily. Reported on 10/10/2015    . cloNIDine (CATAPRES - DOSED IN MG/24 HR) 0.3 mg/24hr patch Place 1 patch (0.3 mg total) onto the skin once a week. (Patient not taking: Reported on 08/23/2015) 4 patch 12  . Nutritional Supplements (FEEDING SUPPLEMENT, JEVITY 1.2 CAL,) LIQD Place 1,000 mLs into feeding tube continuous. (Patient not taking: Reported on 08/23/2015) 1000 mL 0   No facility-administered medications prior to visit.    PAST MEDICAL HISTORY: Past Medical History  Diagnosis Date  . High  cholesterol   . Cognitive decline   . Dementia   . Hypertension   . Thyroid disease   . UTI (urinary tract infection)   . Arthritis     PAST SURGICAL HISTORY: Past Surgical History  Procedure Laterality Date  . Colon surgery      FAMILY HISTORY: Family History  Problem Relation Age of Onset  . Dementia Neg Hx     SOCIAL HISTORY: Social History   Social History  . Marital Status: Married    Spouse Name: Brooke Watson  . Number of Children: 1  . Years of Education: 12   Occupational History  . Retired [employer]   Social History Main Topics  . Smoking status: Former Research scientist (life sciences)  . Smokeless tobacco: Never Used  . Alcohol Use: No  . Drug Use: No  . Sexual Activity: No   Other Topics Concern  . Not on file   Social History Narrative   Patient is married. Brooke Watson   Patient has a daughter.   Patient is retired.    Caffeine use: none       Patient lives at home.          PHYSICAL EXAM  Filed Vitals:   10/10/15 1115  BP: 140/94  Pulse: 80   There is no weight on file to calculate BMI.  Generalized: Well developed, in no acute distress On a stretcher Head: normocephalic and atraumatic,. Oropharynx benign  Neck:  Dystonic neck posturing  no carotid bruits  Cardiac: Regular rate rhythm, no murmur  Lungs Clear to auscultation Musculoskeletal: Significant rigidity  Neurological examination   Mentation: Mumbles to her name. Rarely makes eye contact she is otherwise nonverbal.  Cranial nerve II-XII: Pupils were equal round reactive to light  Face appears symmetric . Does not follow any commands. Motor: Increased tone in all 4 extremities Sensory: Withdraws to pain in the upper extremities Coordination: Unable to test   DIAGNOSTIC DATA (LABS, IMAGING, TESTING) - I reviewed patient records, labs, notes, testing and imaging myself where available.  Lab Results  Component Value Date   WBC 8.8 08/04/2015   HGB 12.2 08/04/2015   HCT 37.1 08/04/2015   MCV 90.5  08/04/2015   PLT 253 08/04/2015      Component Value Date/Time   NA 143 08/05/2015 0530   NA 142 07/14/2015 1133   K 3.5 08/05/2015 0530   CL 108 08/05/2015 0530   CO2 24 08/05/2015 0530   GLUCOSE 128* 08/05/2015 0530   GLUCOSE 83 07/14/2015 1133   BUN 24* 08/05/2015 0530   BUN 15 07/14/2015 1133   CREATININE 0.72 08/05/2015 0530   CALCIUM 8.7* 08/05/2015 0530   PROT 5.7* 08/01/2015 0352   PROT 6.2 07/14/2015 1133   ALBUMIN 2.6* 08/01/2015 0352   ALBUMIN 4.0 07/14/2015 1133   AST 22 08/01/2015 0352   ALT 13* 08/01/2015 0352   ALKPHOS 62 08/01/2015 0352   BILITOT 0.9 08/01/2015 0352   BILITOT 0.5 07/14/2015 1133   GFRNONAA >60 08/05/2015 0530   GFRAA >60 08/05/2015 0530    Lab Results  Component Value Date   VITAMINB12 837 07/28/2015   Lab Results  Component Value Date   TSH 4.273 07/25/2015      ASSESSMENT AND PLAN 82 year old female who is here for follow up of Dementia with behavioral disturbances and delusions. Patient had a serious fall multiple hemorrhages, subdural hematomas and subarachnoid hemorrhage. Patient was on anti-dopaminergic medication previous to this due to significant behavioral disturbances secondary to end-stage dementia. Parkinsonism was thought secondary to antipsychotic medications even previous to her head trauma. She Is back on low-dose Sinemet 25 100, one half tablet twice daily She exhibits significantly increased tone in bilateral extremities.  Discussed with Dr. Jaynee Eagles via phone Increase Sinemet to 1 tab twice daily 25/100mg   Restart baby aspirin Feeding tube would need to be pulled by GI Patient has had hospice evaluation and does not qualify, may be a candidate for palliative care in the near future after home health visits concluded Dr. Jaynee Eagles is agreeable to prescribing all of her medications. No  follow up planned will handle issues over the phone per Dr. Anson [place] time [place], [facility], [facility], [facility] [address] [place], [place] [address] [phone] [place] in  Personally reviewed plan as stated above and agree.  I have personally reviewed the history, evaluated lab date, reviewed imaging studies and agree with radiology interpretations.   [facility]

## 2015-10-10 NOTE — Telephone Encounter (Signed)
Lonnie/[facility] called needing verbal orders for 1 x week for 4 weeks to address safe feeding and swallowing starting today July 3rd. Phone: [phone]

## 2015-10-13 ENCOUNTER — Other Ambulatory Visit: Payer: Self-pay | Admitting: Neurology

## 2015-10-13 ENCOUNTER — Ambulatory Visit: Payer: Medicare Other | Admitting: Neurology

## 2015-10-13 ENCOUNTER — Telehealth: Payer: Self-pay | Admitting: *Deleted

## 2015-10-13 MED ORDER — CLONIDINE HCL 0.1 MG PO TABS
0.1000 mg | ORAL_TABLET | Freq: Two times a day (BID) | ORAL | Status: AC
Start: 2015-10-13 — End: ?

## 2015-10-13 MED ORDER — BACLOFEN 10 MG PO TABS: 5.0000 mg | ORAL_TABLET | Freq: Two times a day (BID) | ORAL | Status: DC

## 2015-10-13 MED ORDER — ATORVASTATIN CALCIUM 10 MG PO TABS
10.0000 mg | ORAL_TABLET | Freq: Every day | ORAL | Status: AC
Start: 2015-10-13 — End: ?

## 2015-10-13 MED ORDER — LEVOTHYROXINE SODIUM 50 MCG PO TABS: 50.0000 ug | ORAL_TABLET | Freq: Every day | ORAL | Status: AC

## 2015-10-13 MED ORDER — CARBIDOPA-LEVODOPA 25-100 MG PO TABS: 1.0000 | ORAL_TABLET | Freq: Two times a day (BID) | ORAL | Status: AC

## 2015-10-13 NOTE — Telephone Encounter (Signed)
I refilled them thanks

## 2015-10-13 NOTE — Telephone Encounter (Signed)
Dr Jaynee EaglesHoyle Sauer stated she spoke with you and said you were going to refill all of pt medications and be primary care for pt. Are you ok with refilling? If so, which medications are you refilling?

## 2015-10-14 ENCOUNTER — Telehealth: Payer: Self-pay | Admitting: Neurology

## 2015-10-14 NOTE — Telephone Encounter (Signed)
Called daughter back. Advised all med refills sent to pharmacy yesterday. She verbalized understanding.

## 2015-10-14 NOTE — Telephone Encounter (Signed)
Pt's daughter [name]) called request all 5 of pt's medications to [facility] (she does not know the names). She has not called these to pharmacy.

## 2015-10-18 ENCOUNTER — Encounter: Payer: Self-pay | Admitting: *Deleted

## 2015-10-18 ENCOUNTER — Telehealth: Payer: Self-pay | Admitting: Neurology

## 2015-10-18 NOTE — Telephone Encounter (Signed)
Tonja/[facility] [phone] ext [phone] called regarding order. Can't find the order at this moment, will call back.

## 2015-10-18 NOTE — Telephone Encounter (Signed)
Received order via mail to [facility] for [facility] [facility]. Dr Jaynee Eagles signed. I faxed to [facility] at [phone]. Received confirmation.

## 2015-10-18 NOTE — Progress Notes (Signed)
Faxed signed orders for tape non-waterproof per 18 square secondary/gauze non-woven 4x4 split 6-ply STRL 2/P by AA, MD back to [facility]. Fax: [phone]. Ok'd per Dr Jaynee Eagles to sign orders. Received confirmation.  Dx codeYM:[id number]; Unspecified open wound, left foot, initial encounter for 3 months

## 2015-10-20 ENCOUNTER — Telehealth: Payer: Self-pay | Admitting: Neurology

## 2015-10-20 ENCOUNTER — Encounter: Payer: Self-pay | Admitting: *Deleted

## 2015-10-20 NOTE — Telephone Encounter (Signed)
Called daughter back. Relayed per Dr Jaynee Eagles that they will have to go to PCP and request orders. Dr Jaynee Eagles cannot do this. PT verbalized understanding and stated she is not allowed to call PCP office but will try and have home health nurse call. She is going to try and work something out.

## 2015-10-20 NOTE — Telephone Encounter (Signed)
Dr Jaynee Eagles- please advise Called daughter back. She thinks her mother has CDIFF. She came home from the nursing home on Friday. She has very loose stools that are watery/diarrhea since she got home. She is worried about this. She is having the home health nurse go check on her today. She is wondering if Dr Jaynee Eagles can write order for stool culture and order flagyl as precaution. She said this is hard to manage. She only has a CNA 3x per week to help out. She said she spoke to Ladona [facility]s home health care RN phone number: [phone].  I can call her to know where to send orders if Dr Jaynee Eagles is okay with this.  I told her if Dr Jaynee Eagles is okay with this, we will send orders over. If not, I will call her back to advise. She verbalized understanding.

## 2015-10-20 NOTE — Telephone Encounter (Signed)
Per Dr Jaynee Eagles- pt will have to go to PCP for orders/rx request.

## 2015-10-20 NOTE — Progress Notes (Signed)
Faxed signed orders back to [facility] for POC and wound care/HHA bathing/ADL care. Fax: [phone]. Received confirmation.

## 2015-10-20 NOTE — Telephone Encounter (Signed)
Daughter [name] called requesting to speak with Brooke Watson, regarding medication, no one medicine in particular, just wants to go over medications. Please call [phone].

## 2015-10-27 ENCOUNTER — Telehealth: Payer: Self-pay | Admitting: Neurology

## 2015-10-27 NOTE — Telephone Encounter (Signed)
Kate/[facility] [phone] called to request verbal orders for Social Work eval for wheelchair ramp, states patient's spouse has requested this. Is aware Dr. Jaynee Eagles is out of the office until Tuesday July 25th.

## 2015-10-31 NOTE — Telephone Encounter (Signed)
Dr Ahern- are you okay with this?  

## 2015-11-01 ENCOUNTER — Encounter: Payer: Self-pay | Admitting: *Deleted

## 2015-11-01 NOTE — Patient Instructions (Signed)
Faxed signed orders for tap non waterproof per 18 sq inches gauze non impreg str to [facility]. Fax: [phone]. Received confirmation.

## 2015-11-02 NOTE — Telephone Encounter (Signed)
Kate/[facility] is calling back about request for Social Work for this patient. Thanks!

## 2015-11-02 NOTE — Telephone Encounter (Signed)
Dr Jaynee Eagles- please advise. See below.

## 2015-11-02 NOTE — Telephone Encounter (Signed)
That is fine  thanks

## 2015-11-03 ENCOUNTER — Encounter: Payer: Self-pay | Admitting: *Deleted

## 2015-11-03 NOTE — Patient Instructions (Signed)
Faxed signed orders back to [facility] home health for wound care on dorsal side left 4th toe. Fax: [phone]. Received confirmation.

## 2015-11-03 NOTE — Telephone Encounter (Signed)
Called and LVM giving verbal order per Dr Jaynee Eagles for social work evaluation for wheelchair ramp. Gave [facility] phone number and hours if she has further questions.

## 2015-11-07 NOTE — Telephone Encounter (Addendum)
Dr Jaynee Eagles- Juluis Rainier Called daughter back. Daughter stated that skilled nursing got changed to 2x/week instead of 3x/week. She would like her mother to go back to 3x/week. While I was on the phone, she contacted [facility] and we did a three way call. I gave verbal orders per Dr Jaynee Eagles to Deatra James at [facility] for SN 3x/week. Daughter verbalized understanding and appreciation. Bridgett states she will get patient set up for this.

## 2015-11-07 NOTE — Telephone Encounter (Signed)
LVM for Brooke Watson, daughter to call back. Advised I had some questions for her. Need to know what orders she is talking about and from who. Gave [facility] phone number.

## 2015-11-07 NOTE — Telephone Encounter (Signed)
Pt's daughter called request order was changed to 2 wk but but they really need 3 x wk. She said it would be great 5 x wk but she thinks it will cover only 3 x wk. Please call

## 2015-11-07 NOTE — Telephone Encounter (Signed)
thanks

## 2015-11-07 NOTE — Telephone Encounter (Signed)
Called Brooke Watson at [facility]. Relayed daughter request and she is not aware of request being made. She was driving and going to check on this. She will call back and update [place]. Advised ok to leave message w/ phone staff. She verbalized understanding.

## 2015-11-07 NOTE — Telephone Encounter (Signed)
[name] , pts daughter is requesting [facility] orders for a CNA 3 x week. Apparently orders where changed to 2 x week from 3 x week. Please change back to 3 x week. May call [phone]

## 2015-11-15 ENCOUNTER — Other Ambulatory Visit: Payer: Self-pay | Admitting: Neurology

## 2015-11-15 ENCOUNTER — Telehealth: Payer: Self-pay | Admitting: Neurology

## 2015-11-15 MED ORDER — BACLOFEN 10 MG PO TABS: 10.0000 mg | ORAL_TABLET | Freq: Two times a day (BID) | ORAL | 11 refills | Status: AC

## 2015-11-15 NOTE — Telephone Encounter (Signed)
Called and spoke to daughter. Relayed per Dr Jaynee Eagles that it is okay for her to continue to take 1 tab two times daily of the 10mg  like she has been. Dr Jaynee Eagles called in a new rx. She verbalized understanding.

## 2015-11-15 NOTE — Telephone Encounter (Signed)
That's fine, I reordered thanks!

## 2015-11-15 NOTE — Telephone Encounter (Signed)
Patient's daughter Brooke Watson is calling and states the CNA that attends her mother has been giving her the wrong dosage of Rx Baclofen 10 mg tablets.  She states the Rx is to be administered 1/2 tablet at a time and the CNA is giving her 1 tablet at a time.  She states she has not noticed any effects from this.  They will need another Rx baclofen to [facility] [place].  Please call Brooke Watson.  Thanks!

## 2015-11-15 NOTE — Telephone Encounter (Addendum)
Dr Jaynee Eagles- please advise  Called daughter back. She stated her mother has been taking a whole tab of baclofen (10mg ) 2 times daily, not the 0.5mg  2 times daily as prescribed. She is wondering how Dr Jaynee Eagles wants her to proceed. She is worried her mother will go through withdraws.  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  Looks like rx baclofen sent on 10/13/15 was for quantity 60, and should be changed to quantity 30 since patient should be taking 0.5 tablet 2x/day. How would you like duaghter to proceed?

## 2015-11-23 ENCOUNTER — Ambulatory Visit: Payer: Medicare Other | Admitting: Neurology

## 2015-11-23 ENCOUNTER — Encounter: Payer: Self-pay | Admitting: *Deleted

## 2015-11-23 NOTE — Progress Notes (Signed)
Faxed signed [facility] to [facility] for HA 3 week 5, assist with personal care ADL's and IADLS. Fax: [phone]. Received confirmation.

## 2015-11-30 ENCOUNTER — Telehealth: Payer: Self-pay | Admitting: Neurology

## 2015-11-30 DIAGNOSIS — R262 Difficulty in walking, not elsewhere classified: Secondary | ICD-10-CM

## 2015-11-30 DIAGNOSIS — G20 Parkinson's disease: Secondary | ICD-10-CM

## 2015-11-30 DIAGNOSIS — R531 Weakness: Secondary | ICD-10-CM

## 2015-11-30 NOTE — Telephone Encounter (Signed)
Dr Jaynee Eagles- please advise  Called daughter back. She stated she was discussing with home health and they feel patient should be evaluated and treated by PT. Daughter requesting Dr Jaynee Eagles placed order for PT and that she needs a wheelchair.  Should place in notes to send to [facility]: Lorriane Shire: Fax: [phone] Advised I will speak to Dr Jaynee Eagles and see if she is willing. If yes, we will send order. She verbalized understanding.   Daughter states her mother is doing better. More alert, singing songs. CNA evaluated her last night and she was having a little confusion. They are having a ramp installed at her house so she can get in and out of house. I also asked daughter if they have established patient with a different PCP to address other orders that Dr Jaynee Eagles has been signing until they establish with PCP. She states they have not. Patient's husband has appt with Dr Philip Aspen in October and she is going to see if she can get patient an appt as well.

## 2015-11-30 NOTE — Telephone Encounter (Signed)
Daughter called to speak with RN regarding PT order. Pls call

## 2015-11-30 NOTE — Telephone Encounter (Signed)
Verneita Griffes can you place an order for PT and home health via [facility]? Terrence Dupont usually contacts them directly. Let me know how you and Dr. Leta Baptist do this thanks

## 2015-12-02 NOTE — Telephone Encounter (Addendum)
Spoke with Charlynn Court [facility] who stated the patient had a PT evaluation on July 4th. She is currently receiving care with OT, HH aid and RN for wound care. Stanton Kidney Stated this RN should contact Lorriane Shire, Therapist, sports and give verbal order. Spoke with Cristino Martes RN and gave her Dr Cathren Laine verbal order for PT evaluation and therapy and evaluation for wheelchair. She stated she will see patient next Monday and will place verbal order today. She verbalized understanding, appreciation of call.

## 2015-12-06 NOTE — Telephone Encounter (Signed)
Dr Ahern- are you okay with this?  

## 2015-12-06 NOTE — Telephone Encounter (Signed)
That's fine thank you

## 2015-12-06 NOTE — Telephone Encounter (Signed)
Pramod with [facility] is calling to get a verbal order for the patient to have PT 2 times a week for the next 6 weeks.Marland Kitchen

## 2015-12-07 NOTE — Telephone Encounter (Signed)
Called and LVM for Pramod at [facility]. Advised Dr Jaynee Eagles ok with doing PT 2x.week for 6 weeks. Asked him to call back to ensure he received message. Gave [facility] phone number

## 2015-12-07 NOTE — Telephone Encounter (Signed)
Pramod called to confirm he got the [facility]

## 2015-12-13 NOTE — Telephone Encounter (Signed)
[name] with [facility] is following up on a request for a wheelchair for the patient. Please fax request to [phone] or [phone]. Gerald Stabs said the patient is having interactions to medications Amlodipine and Norvasc and also to carbidopa-levodopa (SINEMET IR) 25-100 MG tablet and Sinemet. Are all these medications supposed to be taken? Gerald Stabs also needs an order for a medical social worker to assist with finding out how a ramp can be built for the patient.

## 2015-12-13 NOTE — Addendum Note (Signed)
Addended by: Hope Pigeon on: 12/13/2015 05:19 PM   Modules accepted: Orders

## 2015-12-13 NOTE — Telephone Encounter (Signed)
Called [name] back. Advised amlodipine and norvasc/ sinemet and carbidopa-levedopa are the same medications. He verbalized understanding and stated they may have duplicate orders. May be a clerical error on their end. He is going to check patient's medications when he goes to visit her on Thursday to double check this. Advised patient has not been seen in our office since 10/10/15 and may need a f/u to have documentation as to why she needs wheelchair. I advised normally patient's need to be seen within the last 30 days to qualify. He verbalized understanding and checked with another colleague on his end and verified this was correct. They normally send request to get equipment via [facility].  He is going to call daughter and find out what she would like to do as far as scheduling a follow up appt for wheel chair. He will call back and let me know what they discuss.   He would still like me to fax a DME order for wheelchair to number listed below and also a referral for social work to eval for need for ramp. Advised I will fax this to them.

## 2015-12-13 NOTE — Telephone Encounter (Signed)
Amlodipine and Norvasc are the same medications as are Sinemet and carbidopa/levodopa can you please call and see what side effects?  They need a primary care for the Amlodipine. Find out what interactions, is her blood pressure low? What request? We don't have a request on file for this.

## 2015-12-13 NOTE — Telephone Encounter (Signed)
Faxed order for wheelchair/social work to eval for ramp. Faxed to [phone]. Received confirmation.  Awaiting call back from [name]/daughter about making f/u possibly for wheelchair.

## 2015-12-13 NOTE — Telephone Encounter (Signed)
Dr Ahern- please advise 

## 2015-12-15 ENCOUNTER — Encounter: Payer: Self-pay | Admitting: *Deleted

## 2015-12-15 NOTE — Progress Notes (Signed)
Faxed signed orders by AA.MD for one additional nurse visit for recert date [id number]. SN 1 Week 1. Faxed to [place] at [phone]. Received confirmation.

## 2015-12-19 ENCOUNTER — Telehealth: Payer: Self-pay | Admitting: Neurology

## 2015-12-19 NOTE — Telephone Encounter (Signed)
Bridgette/[facility] [phone] called to follow up on 11 different orders that were mailed to our office, orders need to be signed by Dr. Jaynee Eagles from March, July 4th, July 6th, July 14th, July 26th, July 31st, Aug 24th, Aug 28th, Aug 30th, some dates have multiple orders, states verbal orders have been given for these dates, needs signed orders.

## 2015-12-19 NOTE — Telephone Encounter (Signed)
Called and LVM for Bridgette returning her call. Gave pt name and DOB and advised we have been faxing back signed orders that we received in the mail. Asked her to call back to discuss.

## 2015-12-19 NOTE — Telephone Encounter (Signed)
Called and LVM for Brooke Watson to call at [facility] to see if he has spoken with daughter to see if f/u needed to be make for wheelchair. Gave [facility] phone number.

## 2015-12-21 NOTE — Telephone Encounter (Signed)
Tried calling Brooke Watson again since no return call yet. LVM for him to call. Gave [facility] phone number.   [facility] main number: [phone]

## 2015-12-21 NOTE — Telephone Encounter (Signed)
Tried Research officer, trade [employer] again since no return call. Spoke to Davy. Advised I LVM a couple days ago returning her call but have not heard back. She stated she was at a meeting right now and she will have her call me back. I gave name and number.

## 2015-12-22 ENCOUNTER — Encounter: Payer: Self-pay | Admitting: *Deleted

## 2015-12-22 NOTE — Telephone Encounter (Signed)
[name]/[facility] [phone] said he was returning RN's call

## 2015-12-22 NOTE — Telephone Encounter (Signed)
Gerald Stabs returned call to nurse. Please call

## 2015-12-22 NOTE — Telephone Encounter (Signed)
LVM returning call from [place]. Asked him to call back.

## 2015-12-22 NOTE — Telephone Encounter (Signed)
Called and spoke to [place] from [facility]. Relayed that we have been faxing back signed orders that we have been receiving in the mail. I told her the fax number I was using and she states it was the [place] location and they should forward to them. She gave me a different fax number: [phone] to fax orders back to. She is going to resend everything for Dr Jaynee Eagles to sign again. Advised I will fax once we receive. She verbalized understanding.

## 2015-12-22 NOTE — Progress Notes (Signed)
Faxed signed POC dated for 12/09/15-02/06/16 back to [facility]. Fax: [phone]. Received confirmation.

## 2015-12-22 NOTE — Telephone Encounter (Signed)
Called and spoke to courtney at [facility] for [facility]. She is on wait list for ramp.

## 2015-12-23 NOTE — Telephone Encounter (Signed)
Called and spoke to [place]. Advised I got in touch with main office. He stated patient was getting wheel chair. MD went and evaluated patient in home for need for wheelchair/ramp. Insurance may or may not cover wheelchair. Daughter is following up on this.  Advised I spoke to [facility] yesterday and she was going to refax orders for Dr Jaynee Eagles to sign. He will follow up with her about this and make sure this occurred.  Advised I will keep an eye out for these.

## 2015-12-29 NOTE — Telephone Encounter (Signed)
LVM for Courtney. Advised I have not seen any faxes come through. I spoke with Bridgett last week and have also spoke with Gerald Stabs. Asked her to call back. Gave [facility] phone number.

## 2016-01-20 NOTE — Telephone Encounter (Signed)
Received orders via fax that needed to be signed by AA,MD. Faxed signed orders abck to [facility]. Fax: [phone]. Received confirmation.

## 2016-02-02 ENCOUNTER — Telehealth: Payer: Self-pay | Admitting: Neurology

## 2016-02-03 NOTE — Telephone Encounter (Signed)
error 

## 2016-02-24 ENCOUNTER — Telehealth: Payer: Self-pay | Admitting: Neurology

## 2016-02-24 NOTE — Telephone Encounter (Signed)
Ellen/[facility] [phone] called requesting hard copy of verbal orders for nursing and PT on 12/01/15. She said to ask for [name] if any questions. Please fax to [phone]

## 2016-02-24 NOTE — Telephone Encounter (Signed)
LVM for courtney asking them to fax orders for AA,MD to sign. Gave [facility] fax [phone].

## 2016-03-08 NOTE — Telephone Encounter (Signed)
Received fax for orders. Awaiting AA,MD signature.

## 2016-03-08 NOTE — Telephone Encounter (Signed)
Faxed signed orders back to [place] for one additional nursing visit for recert date 0000000. Fax: [phone]. Received confirmation.

## 2016-03-08 NOTE — Telephone Encounter (Signed)
Brooke Watson called to advise the order was faxed to [phone], I advised her to fax to [phone] per RN request. Says she will fax it asap.

## 2017-05-14 ENCOUNTER — Encounter (HOSPITAL_COMMUNITY): Payer: Self-pay | Admitting: Emergency Medicine

## 2017-05-14 ENCOUNTER — Emergency Department (HOSPITAL_COMMUNITY): Payer: Medicare Other

## 2017-05-14 ENCOUNTER — Other Ambulatory Visit: Payer: Self-pay

## 2017-05-14 ENCOUNTER — Emergency Department (HOSPITAL_COMMUNITY)
Admission: EM | Admit: 2017-05-14 | Discharge: 2017-05-14 | Disposition: A | Discharge status: Home / Self Care | Payer: Medicare Other | Attending: Emergency Medicine | Admitting: Emergency Medicine

## 2017-05-14 DIAGNOSIS — Z79899 Other long term (current) drug therapy: Secondary | ICD-10-CM | POA: Diagnosis not present

## 2017-05-14 DIAGNOSIS — G20 Parkinson's disease: Secondary | ICD-10-CM | POA: Diagnosis not present

## 2017-05-14 DIAGNOSIS — K9423 Gastrostomy malfunction: Secondary | ICD-10-CM | POA: Insufficient documentation

## 2017-05-14 DIAGNOSIS — Y828 Other medical devices associated with adverse incidents: Secondary | ICD-10-CM | POA: Insufficient documentation

## 2017-05-14 DIAGNOSIS — I10 Essential (primary) hypertension: Secondary | ICD-10-CM | POA: Diagnosis not present

## 2017-05-14 DIAGNOSIS — F0390 Unspecified dementia without behavioral disturbance: Secondary | ICD-10-CM | POA: Insufficient documentation

## 2017-05-14 DIAGNOSIS — T85628A Displacement of other specified internal prosthetic devices, implants and grafts, initial encounter: Secondary | ICD-10-CM | POA: Diagnosis not present

## 2017-05-14 DIAGNOSIS — Z87891 Personal history of nicotine dependence: Secondary | ICD-10-CM | POA: Insufficient documentation

## 2017-05-14 MED ORDER — IOPAMIDOL (ISOVUE-300) INJECTION 61%
INTRAVENOUS | Status: AC
  Administered 2017-05-14: 30 mL via ORAL
  Filled 2017-05-14: qty 30

## 2017-05-14 MED ORDER — IOPAMIDOL (ISOVUE-300) INJECTION 61%
30.0000 mL | Freq: Once | INTRAVENOUS | Status: AC | PRN
  Administered 2017-05-14: 30 mL via ORAL

## 2017-05-14 NOTE — ED Notes (Signed)
Dr.Nanaviti at bedside to place new G-Tube.

## 2017-05-14 NOTE — ED Provider Notes (Signed)
[facility] DEPT Provider Note   CSN: [id number] Arrival date & time: 05/14/17  0136     History   Chief Complaint Chief Complaint  Patient presents with  . G tube displaced    HPI Brooke Watson is a 84 y.o. female.  HPI Level 5 caveat for dementia and vegetative state.  84 year old female comes from nursing home after her G-tube was noted to be dislodged.  G-tube in place because patient advised her to state, and failure to thrive.  Past Medical History:  Diagnosis Date  . Arthritis   . Cognitive decline   . Dementia   . High cholesterol   . Hypertension   . Thyroid disease   . UTI (urinary tract infection)     Patient Active Problem List   Diagnosis Date Noted  . Nontraumatic intracerebral hemorrhage ([place])   . Pressure ulcer 08/03/2015  . Dysphagia   . Weakness generalized   . DNR (do not resuscitate)   . Encounter for palliative care   . Goals of care, counseling/discussion   . Palliative care encounter   . Parkinson disease ([place])   . SDH (subdural hematoma) ([place])   . Malnutrition of moderate degree 07/27/2015  . ICH (intracerebral hemorrhage) ([place])   . Nontraumatic cortical hemorrhage of right cerebral hemisphere ([place])   . Other specified hypothyroidism   . Dehydration 07/25/2015  . Acute cerebral hemorrhage ([place]) 07/25/2015  . Accelerated hypertension 07/25/2015  . Acute encephalopathy 06/13/2015  . Aggressive behavior 09/05/2014  . Dementia with behavioral disturbance 07/01/2014  . Delusions ([place]) 07/01/2014  . RLS (restless legs syndrome) 02/16/2014  . Cognitive decline 11/02/2013    Past Surgical History:  Procedure Laterality Date  . COLON SURGERY    . GASTROSTOMY W/ FEEDING TUBE      OB History    No data available       Home Medications    Prior to Admission medications   Medication Sig Start Date End Date Taking? Authorizing Provider  atorvastatin (LIPITOR) 10 MG tablet Take 1 tablet (10 mg  total) by mouth at bedtime. Reported on 08/23/2015 10/13/15   Melvenia Beam, MD  baclofen (LIORESAL) 10 MG tablet Place 1 tablet (10 mg total) into feeding tube 2 (two) times daily. 11/15/15   Melvenia Beam, MD  bisacodyl (DULCOLAX) 10 MG suppository Place 10 mg rectally daily as needed for moderate constipation.    [provider]  carbidopa-levodopa (SINEMET IR) 25-100 MG tablet Take 1 tablet by mouth 2 (two) times daily. Separate by 4 hours apart.Separate from protein by at least 30-60 minutes. 10/13/15   Melvenia Beam, MD  cloNIDine (CATAPRES) 0.1 MG tablet Take 1 tablet (0.1 mg total) by mouth 2 (two) times daily. 10/13/15   Melvenia Beam, MD  levothyroxine (SYNTHROID, LEVOTHROID) 50 MCG tablet Take 1 tablet (50 mcg total) by mouth daily before breakfast. Reported on 08/23/2015 10/13/15   Melvenia Beam, MD  magnesium hydroxide (MILK OF MAGNESIA) 400 MG/5ML suspension Take by mouth daily as needed for mild constipation.    [provider]    Family History Family History  Problem Relation Age of Onset  . Dementia Neg Hx     Social History Social History   Tobacco Use  . Smoking status: Former Research scientist (life sciences)  . Smokeless tobacco: Never Used  Substance Use Topics  . Alcohol use: No  . Drug use: No     Allergies   Ambien [zolpidem tartrate] and Amoxicillin  Review of Systems Review of Systems  Unable to perform ROS: Patient nonverbal     Physical Exam Updated Vital Signs BP 109/74 (BP Location: Left Arm)   Pulse 68   Temp 97.6 F (36.4 C) (Axillary)   Resp 18   Ht 5\' 4"  (1.626 m)   Wt 65.8 kg (145 lb)   SpO2 96%   BMI 24.89 kg/m   Physical Exam  HENT:  Head: Atraumatic.  Cardiovascular: Normal rate.  Pulmonary/Chest: Effort normal.  Abdominal: Bowel sounds are normal.  Dislodged G-tube, G-tube site looks clean.  Skin: Skin is warm and dry.  Nursing note and vitals reviewed.    ED Treatments / Results  Labs (all labs ordered are listed,  but only abnormal results are displayed) Labs Reviewed - No data to display  EKG  EKG Interpretation None       Radiology Dg Abdomen 1 View  Result Date: 05/14/2017 CLINICAL DATA:  G-tube confirmation EXAM: ABDOMEN - 1 VIEW COMPARISON:  08/01/2015 FINDINGS: 30 mL Isovue-300 injected into the patient's G-tube. Contrast opacifies the duodenum and jejunum. Radiolucent balloon projects near the gastric outlet region. Moderate diffuse gaseous enlargement of bowel. IMPRESSION: Contrast opacifies the duodenum, no extravasation. Radiolucent balloon and tip of the gastrostomy tube projects over gastric outlet/ gastroduodenal region. Electronically Signed   By: Donavan Foil M.D.   On: 05/14/2017 03:52    Procedures Gastrostomy tube replacement Date/Time: 05/14/2017 3:30 AM Performed by: Varney Biles, MD Authorized by: Varney Biles, MD  Consent: The procedure was performed in an emergent situation. Patient identity confirmed: arm band Time out: Immediately prior to procedure a "time out" was called to verify the correct patient, procedure, equipment, support staff and site/side marked as required. Preparation: Patient was prepped and draped in the usual sterile fashion. Local anesthesia used: no  Anesthesia: Local anesthesia used: no  Sedation: Patient sedated: no  Patient tolerance: Patient tolerated the procedure well with no immediate complications    (including critical care time)  Medications Ordered in ED Medications  iopamidol (ISOVUE-300) 61 % injection 30 mL (30 mLs Oral Contrast Given 05/14/17 0337)     Initial Impression / Assessment and Plan / ED Course  I have reviewed the triage vital signs and the nursing notes.  Pertinent labs & imaging results that were available during my care of the patient were reviewed by me and considered in my medical decision making (see chart for details).     Patient comes in after her G-tube dislodged.  We replaced the G-tube,  x-ray confirmed the placement.  Final Clinical Impressions(s) / ED Diagnoses   Final diagnoses:  Malfunction of gastrostomy tube [facility])  Displacement of device, initial encounter    ED Discharge Orders    None       Varney Biles, MD 05/14/17 2307

## 2017-05-14 NOTE — ED Notes (Signed)
[facility] here for transport back to [facility]

## 2017-05-14 NOTE — ED Triage Notes (Signed)
Pt brought in by EMS from [facility] in [place]  Staff reports that around 1145pm tonight they noticed the pt's feeding tube had come out    Pt is in a vegetative state  No verbal responses, failure to thrive, NPO due to dysphagia  Pt will withdraw from painful stimuli

## 2017-05-14 NOTE — Discharge Instructions (Signed)
G-tube has been replaced. It is ready to be used.

## 2017-11-02 IMAGING — CR DG ABD PORTABLE 1V
1 series · 1 of 1 positions shown · non-contrast
Comparison: None.

CLINICAL DATA: Feeding tube placement

EXAM:
PORTABLE ABDOMEN - 1 VIEW

[AP]
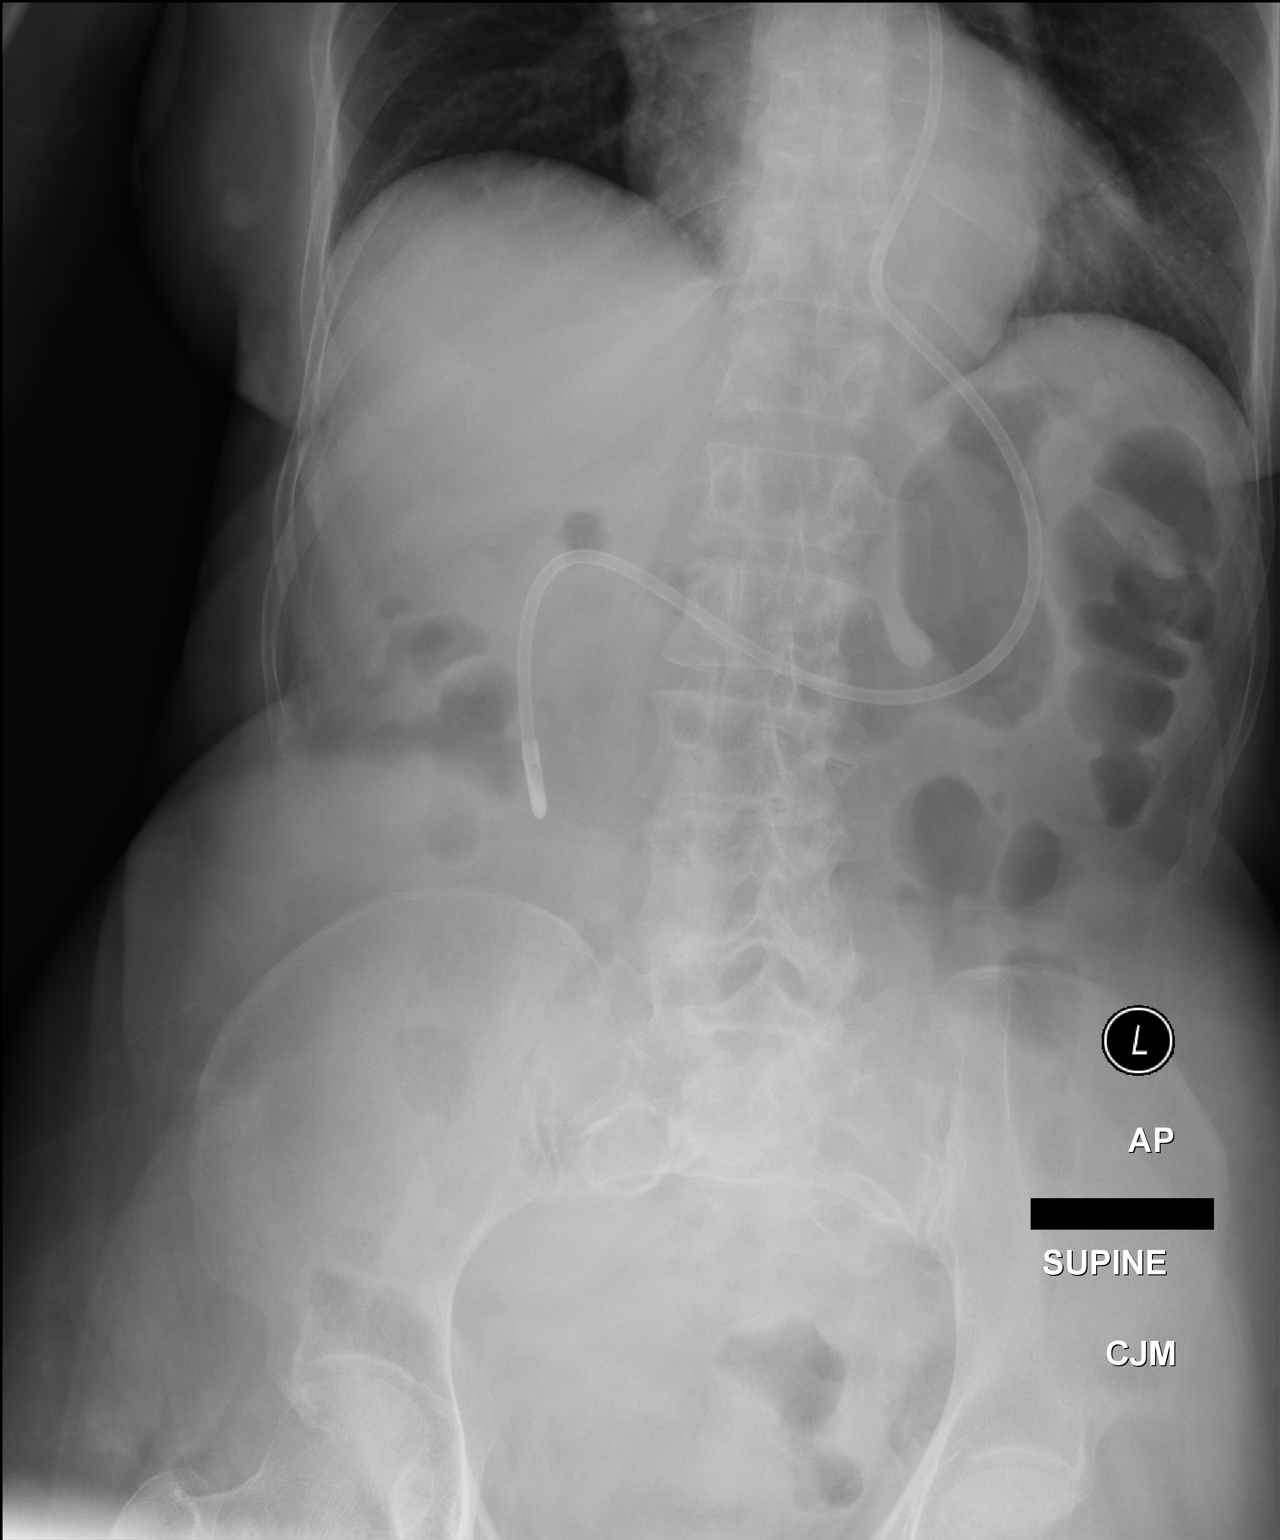

[1 of 1 positions shown; findings below may reference images not displayed]

FINDINGS: Feeding tube is in place with the tip in the descending duodenum.
Nonobstructive bowel gas pattern. Lung bases are clear.
IMPRESSION: Feeding tube tip in the second portion of the duodenum.

## 2017-11-04 IMAGING — XA IR PERC PLACEMENT GASTROSTOMY
3 series · 3 of 3 positions shown · non-contrast
Comparison: none

INDICATION: Stroke

[Series 1: fl (-) angio · 1 of 1 slices shown (1 of 3)]
[im 1/1]
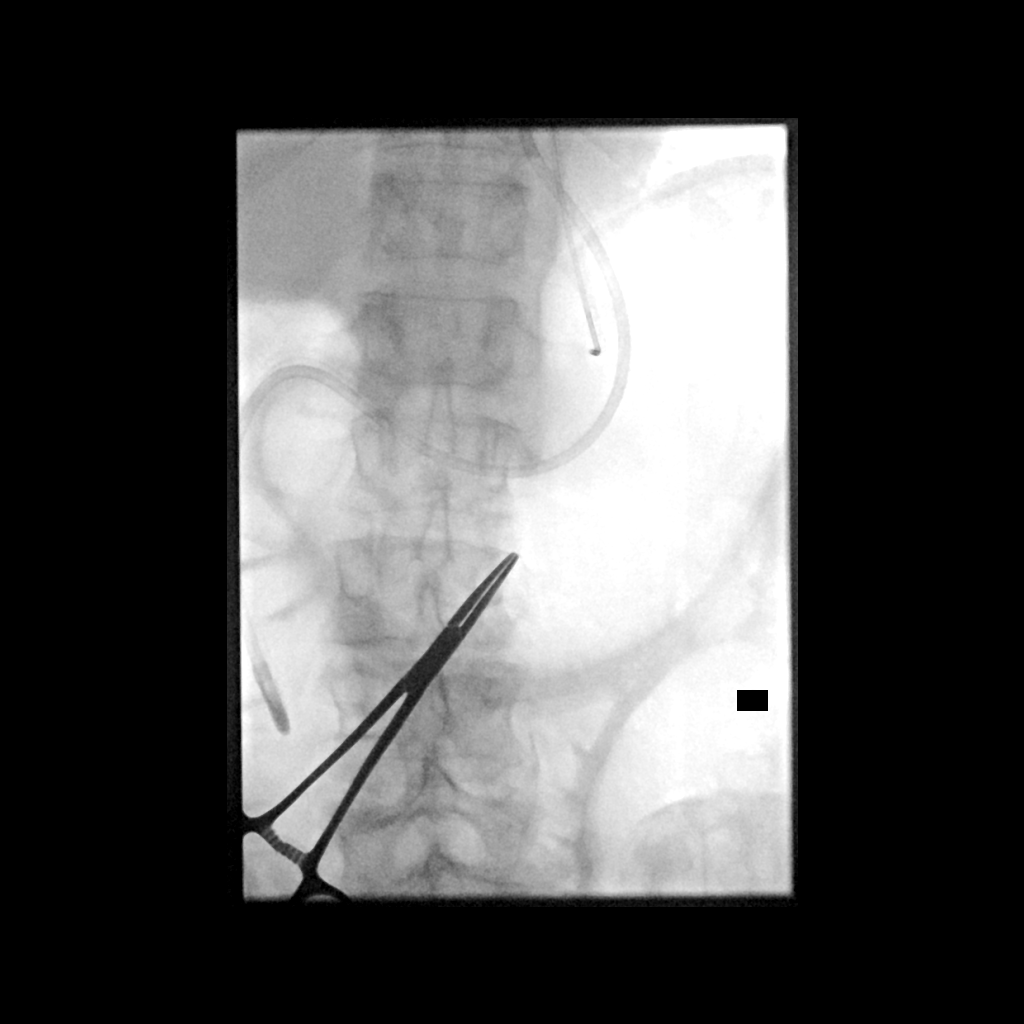

[Series 2: fl (-) angio · 1 of 1 slices shown (2 of 3)]
[im 1/1]
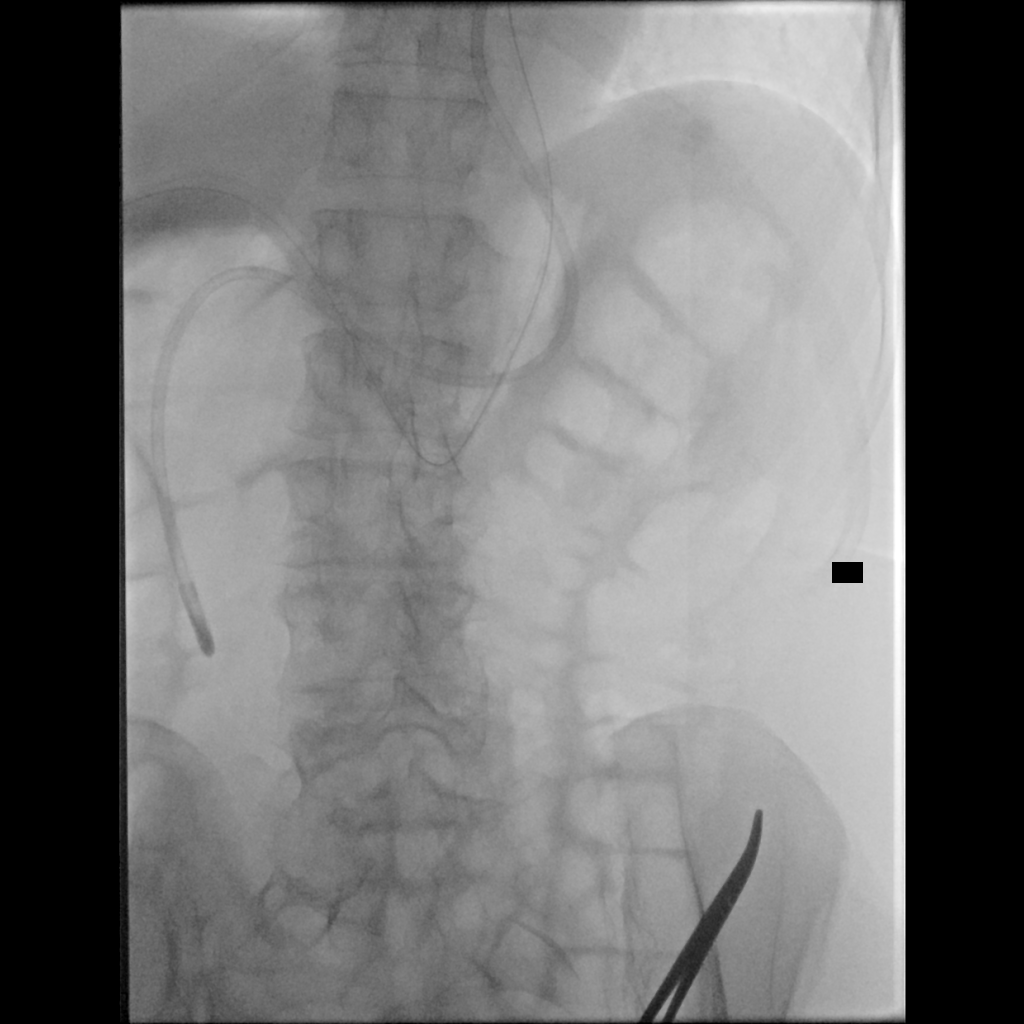

[Series 3: fl (-) angio · 1 of 1 slices shown (3 of 3)]
[im 1/1]
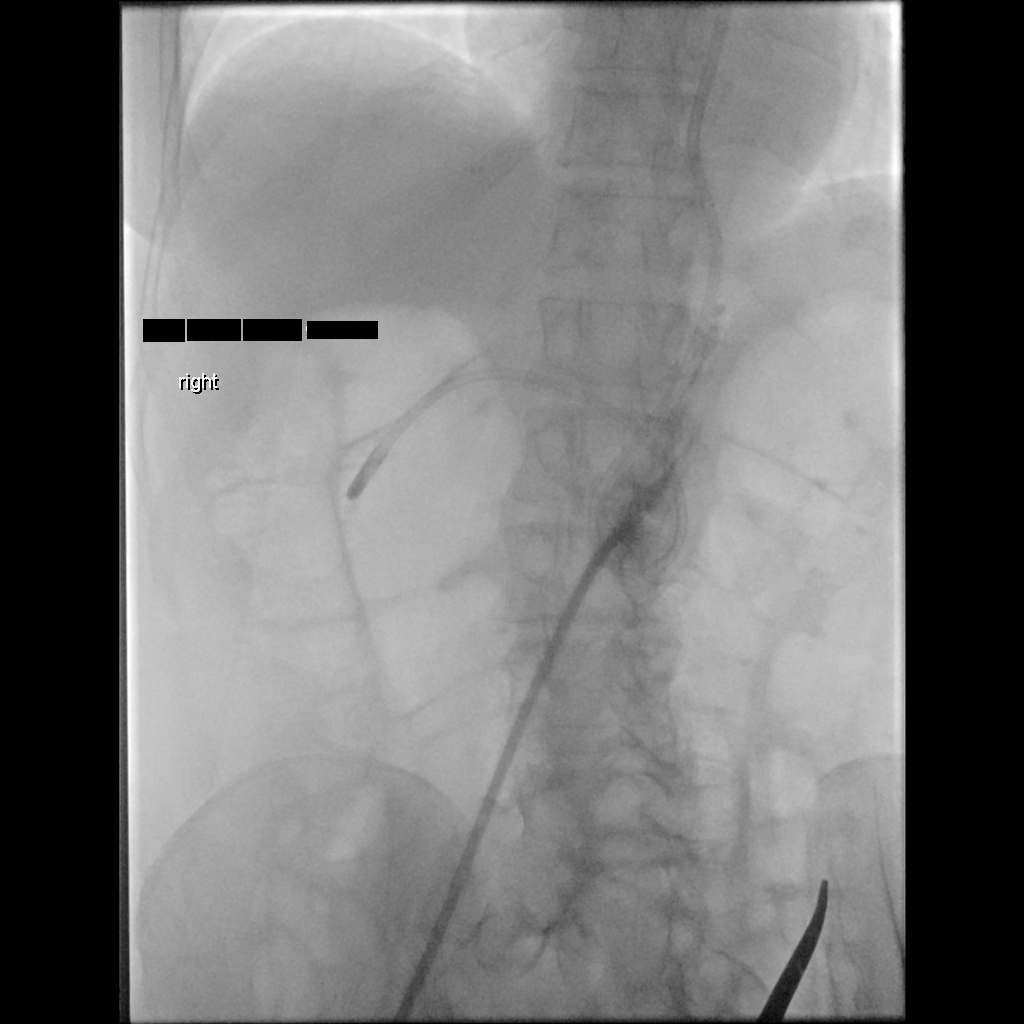

[3 of 3 positions shown; findings below may reference images not displayed]

EXAM:
PERC PLACEMENT GASTROSTOMY

MEDICATIONS:
; Antibiotics were administered within 1 hour of the procedure.
Glucagon 1 mg IV

ANESTHESIA/SEDATION:
Versed 0.5 mg IV; Fentanyl 50 mcg IV

Moderate Sedation Time:  15

The patient was continuously monitored during the procedure by the
interventional radiology nurse under my direct supervision.

CONTRAST:  10mL 5VDC6E-4ZZ IOPAMIDOL (5VDC6E-4ZZ) INJECTION 61% -
administered into the gastric lumen.

FLUOROSCOPY TIME:  Fluoroscopy Time: 3 minutes 6 seconds (4 mGy).

COMPLICATIONS:
None immediate.

PROCEDURE:
The procedure, risks, benefits, and alternatives were explained to
the patient. Questions regarding the procedure were encouraged and
answered. The patient understands and consents to the procedure.

The epigastrium was prepped with Betadine in a sterile fashion, and
a sterile drape was applied covering the operative field. A sterile
gown and sterile gloves were used for the procedure.

A 5-French orogastric tube is placed under fluoroscopic guidance.
Scout imaging of the abdomen confirms barium within the transverse
colon.

The stomach was distended with gas. Under fluoroscopic guidance, an
18 gauge needle was utilized to puncture the anterior wall of the
body of the stomach. An Amplatz wire was advanced through the needle
passing a T fastener into the lumen of the stomach. The T fastener
was secured for gastropexy. A 9-French sheath was inserted.

A snare was advanced through the 9-French sheath. Don Lolito Onacram was
advanced through the orogastric tube. It was snared then pulled out
the oral cavity, pulling the snare, as well. The leading edge of the
gastrostomy was attached to the snare. It was then pulled down the
esophagus and out the percutaneous site. It was secured in place.
Contrast was injected.

The image demonstrates placement of a 20-French pull-through type
gastrostomy tube into the body of the stomach.
IMPRESSION: Successful 20 French pull-through gastrostomy.

## 2018-02-03 ENCOUNTER — Encounter (HOSPITAL_COMMUNITY): Payer: Self-pay | Admitting: Emergency Medicine

## 2018-02-03 ENCOUNTER — Emergency Department (HOSPITAL_COMMUNITY)

## 2018-02-03 ENCOUNTER — Emergency Department (HOSPITAL_COMMUNITY)
Admission: EM | Admit: 2018-02-03 | Discharge: 2018-02-03 | Disposition: A | Discharge status: Home / Self Care | Attending: Emergency Medicine | Admitting: Emergency Medicine

## 2018-02-03 DIAGNOSIS — Z66 Do not resuscitate: Secondary | ICD-10-CM | POA: Insufficient documentation

## 2018-02-03 DIAGNOSIS — T85528S Displacement of other gastrointestinal prosthetic devices, implants and grafts, sequela: Secondary | ICD-10-CM

## 2018-02-03 DIAGNOSIS — Z79899 Other long term (current) drug therapy: Secondary | ICD-10-CM | POA: Insufficient documentation

## 2018-02-03 DIAGNOSIS — G20 Parkinson's disease: Secondary | ICD-10-CM | POA: Insufficient documentation

## 2018-02-03 DIAGNOSIS — K9423 Gastrostomy malfunction: Secondary | ICD-10-CM | POA: Diagnosis not present

## 2018-02-03 DIAGNOSIS — Z87891 Personal history of nicotine dependence: Secondary | ICD-10-CM | POA: Insufficient documentation

## 2018-02-03 DIAGNOSIS — T85528A Displacement of other gastrointestinal prosthetic devices, implants and grafts, initial encounter: Secondary | ICD-10-CM

## 2018-02-03 DIAGNOSIS — Z931 Gastrostomy status: Secondary | ICD-10-CM

## 2018-02-03 DIAGNOSIS — I10 Essential (primary) hypertension: Secondary | ICD-10-CM | POA: Insufficient documentation

## 2018-02-03 DIAGNOSIS — F0390 Unspecified dementia without behavioral disturbance: Secondary | ICD-10-CM | POA: Diagnosis not present

## 2018-02-03 DIAGNOSIS — R6889 Other general symptoms and signs: Secondary | ICD-10-CM

## 2018-02-03 HISTORY — PX: IR REPLC GASTRO/COLONIC TUBE PERCUT W/FLUORO: IMG2333

## 2018-02-03 MED ORDER — IOPAMIDOL (ISOVUE-300) INJECTION 61%
INTRAVENOUS | Status: AC
  Administered 2018-02-03: 7 mL
  Filled 2018-02-03: qty 50

## 2018-02-03 MED ORDER — LIDOCAINE VISCOUS HCL 2 % MT SOLN
OROMUCOSAL | Status: AC
  Administered 2018-02-03: 16:00:00
  Filled 2018-02-03: qty 15

## 2018-02-03 NOTE — Progress Notes (Signed)
[facility] ([facility])  Spoke with Arboriculturist, tentative plan is to go to IR to replace peg tube then return to facility.  Please call [facility] for transportation back to [facility], as they contract with [facility] for this service.  IDT updated.  Please call with any hospice related questions or concerns  Thank you, Venia Carbon BSN, [facility] (listed in [place]) [phone]

## 2018-02-03 NOTE — Discharge Instructions (Signed)
Follow-up with your primary care doctor as needed for problems.

## 2018-02-03 NOTE — ED Notes (Signed)
Patient returned IR

## 2018-02-03 NOTE — Procedures (Signed)
Dementia, dysphagia  S/p fluoro 18 fr Gtube replacement  No comp Stable EBL min Ready for use

## 2018-02-03 NOTE — ED Notes (Signed)
Pt. Going to IR for tube placement

## 2018-02-03 NOTE — ED Provider Notes (Signed)
[facility] EMERGENCY DEPARTMENT Provider Note   CSN: [id number] Arrival date & time: 02/03/18  1323     History   Chief Complaint No chief complaint on file.   HPI Brooke Watson is a 84 y.o. female.  HPI  84 year old female with a history of dementia and failure to thrive who currently has a G-tube presents after her G-tube been pulled out.  History has been taken from the nurse who took history from EMS.  The patient does not talk to me but appears to be at her normal state.  The patient was seen normal at 8 AM and when they checked on her again at 10 her G-tube was out.  They tried to replace it without success.  Past Medical History:  Diagnosis Date  . Arthritis   . Cognitive decline   . Dementia ([place])   . High cholesterol   . Hypertension   . Thyroid disease   . UTI (urinary tract infection)     Patient Active Problem List   Diagnosis Date Noted  . Nontraumatic intracerebral hemorrhage ([place])   . Pressure ulcer 08/03/2015  . Dysphagia   . Weakness generalized   . DNR (do not resuscitate)   . Encounter for palliative care   . Goals of care, counseling/discussion   . Palliative care encounter   . Parkinson disease ([place])   . SDH (subdural hematoma) ([place])   . Malnutrition of moderate degree 07/27/2015  . ICH (intracerebral hemorrhage) ([place])   . Nontraumatic cortical hemorrhage of right cerebral hemisphere ([place])   . Other specified hypothyroidism   . Dehydration 07/25/2015  . Acute cerebral hemorrhage ([place]) 07/25/2015  . Accelerated hypertension 07/25/2015  . Acute encephalopathy 06/13/2015  . Aggressive behavior 09/05/2014  . Dementia with behavioral disturbance ([place]) 07/01/2014  . Delusions ([place]) 07/01/2014  . RLS (restless legs syndrome) 02/16/2014  . Cognitive decline 11/02/2013    Past Surgical History:  Procedure Laterality Date  . COLON SURGERY    . GASTROSTOMY W/ FEEDING TUBE       OB History   None      Home  Medications    Prior to Admission medications   Medication Sig Start Date End Date Taking? Authorizing Provider  atorvastatin (LIPITOR) 10 MG tablet Take 1 tablet (10 mg total) by mouth at bedtime. Reported on 08/23/2015 10/13/15   Melvenia Beam, MD  baclofen (LIORESAL) 10 MG tablet Place 1 tablet (10 mg total) into feeding tube 2 (two) times daily. 11/15/15   Melvenia Beam, MD  bisacodyl (DULCOLAX) 10 MG suppository Place 10 mg rectally daily as needed for moderate constipation.    [provider]  carbidopa-levodopa (SINEMET IR) 25-100 MG tablet Take 1 tablet by mouth 2 (two) times daily. Separate by 4 hours apart.Separate from protein by at least 30-60 minutes. 10/13/15   Melvenia Beam, MD  cloNIDine (CATAPRES) 0.1 MG tablet Take 1 tablet (0.1 mg total) by mouth 2 (two) times daily. 10/13/15   Melvenia Beam, MD  levothyroxine (SYNTHROID, LEVOTHROID) 50 MCG tablet Take 1 tablet (50 mcg total) by mouth daily before breakfast. Reported on 08/23/2015 10/13/15   Melvenia Beam, MD  magnesium hydroxide (MILK OF MAGNESIA) 400 MG/5ML suspension Take by mouth daily as needed for mild constipation.    [provider]    Family History Family History  Problem Relation Age of Onset  . Dementia Neg Hx     Social History Social History   Tobacco  Use  . Smoking status: Former Smoker  . Smokeless tobacco: Never Used  Substance Use Topics  . Alcohol use: No  . Drug use: No     Allergies   Ambien [zolpidem tartrate] and Amoxicillin   Review of Systems Review of Systems  Unable to perform ROS: Dementia     Physical Exam Updated Vital Signs BP (!) 164/121   Pulse 88   Resp 14   SpO2 100%   Physical Exam  Constitutional: She appears well-developed. She appears cachectic.  HENT:  Head: Normocephalic and atraumatic.  Nose: Nose normal.  Eyes: Right eye exhibits no discharge. Left eye exhibits no discharge.  Pulmonary/Chest: Effort normal.  Abdominal: Soft. She  exhibits no distension. There is no tenderness.  G-tube site with hard knot beside g-tube site. Hole in pinpoint at this time. Mild surrounding inflamed skin. No drainage  Skin: Skin is warm and dry.  Nursing note and vitals reviewed.    ED Treatments / Results  Labs (all labs ordered are listed, but only abnormal results are displayed) Labs Reviewed - No data to display  EKG None  Radiology No results found.  Procedures Gastrostomy tube replacement Date/Time: 02/03/2018 1:55 PM Performed by: Sherwood Gambler, MD Authorized by: Sherwood Gambler, MD  Unsuccessful attempt Preparation: Patient was prepped and draped in the usual sterile fashion. Local anesthesia used: no  Anesthesia: Local anesthesia used: no  Sedation: Patient sedated: no  Patient tolerance: Patient tolerated the procedure well with no immediate complications    (including critical care time)  Medications Ordered in ED Medications - No data to display   Initial Impression / Assessment and Plan / ED Course  I have reviewed the triage vital signs and the nursing notes.  Pertinent labs & imaging results that were available during my care of the patient were reviewed by me and considered in my medical decision making (see chart for details).     I tried to place a new G-tube through her tract but the tract is essentially almost closed.  There is minimal skin opening and I am unable to push the G-tube through with moderate pressure.  Thus I discussed with Dr. Reesa Chew of IR who will do the G-tube through interventional radiology. Care to Dr. Eulis Foster with G-tube placement pending  Final Clinical Impressions(s) / ED Diagnoses   Final diagnoses:  None    ED Discharge Orders    None       Sherwood Gambler, MD 02/03/18 1610

## 2018-02-03 NOTE — ED Triage Notes (Signed)
Per [facility] pt coming from [facility] states used gastric tube around 8am and saw at 10am tube was no longer in place. Attempted to reinsert tube and unsuccessful. Patient at baseline per [facility].

## 2018-02-03 NOTE — ED Provider Notes (Signed)
5:08 PM-evaluation after IR placed gastric tube at the request of Dr. Regenia Skeeter.   Report: IMPRESSION:  Successful fluoroscopic replacement of an 80 French balloon  retention gastrostomy      Electronically Signed  By: Jerilynn Mages. Shick M.D.  On: 02/03/2018 16:54    Patient Vitals for the past 24 hrs:  BP Temp Temp src Pulse Resp SpO2  02/03/18 1643 (!) 167/121 - - 85 14 100 %  02/03/18 1425 - 97.7 F (36.5 C) Axillary - - -  02/03/18 1333 (!) 164/121 - - 88 14 100 %    5:11 PM Reevaluation with update and discussion. After initial assessment and treatment, an updated evaluation reveals patient reportedly has chronic hypertension, at the current level by report of EMS.  Patient is under hospice management currently. Daleen Bo   Medical Decision Making: Gastric tube needed for feeding, replaced by interventional radiology.  Patient has end-stage disease, Parkinson's with dementia.  No indication for hospitalization or immediate intervention at this time.  CRITICAL CARE-no Performed by: Rosana Hoes, MD 02/03/18 [phone]

## 2018-08-08 DEATH — deceased
